# Patient Record
Sex: Male | Born: 1965 | Hispanic: Yes | Marital: Married | Smoking: Never smoker
Health system: Western US, Academic
[De-identification: ages and names within clinical notes are randomized; demographics above are authoritative.]

## PROBLEM LIST (undated history)

## (undated) DIAGNOSIS — N289 Disorder of kidney and ureter, unspecified: Secondary | ICD-10-CM

## (undated) DIAGNOSIS — E119 Type 2 diabetes mellitus without complications: Secondary | ICD-10-CM

## (undated) DIAGNOSIS — I10 Essential (primary) hypertension: Secondary | ICD-10-CM

## (undated) HISTORY — PX: TRANSPLANT, KIDNEY: SHX900003

---

## 2012-02-20 ENCOUNTER — Ambulatory Visit: Payer: MEDICARE

## 2012-02-20 ENCOUNTER — Encounter: Payer: Self-pay | Admitting: Nephrology

## 2012-05-29 ENCOUNTER — Other Ambulatory Visit: Payer: Self-pay | Admitting: Nephrology

## 2012-06-02 ENCOUNTER — Ambulatory Visit: Payer: MEDICARE

## 2012-06-02 ENCOUNTER — Ambulatory Visit

## 2012-06-02 NOTE — Nursing Note (Signed)
>>   Merlyn Albert, NP     Mon Jun 02, 2012  1:42 PM  No show for eval appointment in [place]. Call to patient who answered the phone. He had forgotten the appointment as he also has a cardiology appointment pending. Instructed to call [facility] to reschedule. Pt prefers to go to [facility] for complete eval.

## 2012-06-10 ENCOUNTER — Encounter: Payer: Self-pay | Admitting: Nephrology

## 2012-07-05 ENCOUNTER — Encounter: Payer: Self-pay | Admitting: Nephrology

## 2012-07-17 ENCOUNTER — Other Ambulatory Visit: Payer: Self-pay | Admitting: Nephrology

## 2012-07-17 ENCOUNTER — Ambulatory Visit

## 2012-07-17 ENCOUNTER — Ambulatory Visit: Admitting: Nephrology

## 2012-07-17 ENCOUNTER — Ambulatory Visit: Admit: 2012-07-17 | Discharge: 2012-07-17

## 2012-07-17 ENCOUNTER — Encounter: Payer: Self-pay | Admitting: Nephrology

## 2012-07-17 VITALS — BP 172/102 | HR 85 | Temp 96.4°F | Ht 63.39 in | Wt 169.5 lb

## 2012-07-17 DIAGNOSIS — K219 Gastro-esophageal reflux disease without esophagitis: Secondary | ICD-10-CM | POA: Insufficient documentation

## 2012-07-17 DIAGNOSIS — F32A Depression, unspecified: Secondary | ICD-10-CM | POA: Insufficient documentation

## 2012-07-17 DIAGNOSIS — E1121 Type 2 diabetes mellitus with diabetic nephropathy: Secondary | ICD-10-CM | POA: Insufficient documentation

## 2012-07-17 DIAGNOSIS — N186 End stage renal disease: Secondary | ICD-10-CM | POA: Insufficient documentation

## 2012-07-17 DIAGNOSIS — Z992 Dependence on renal dialysis: Secondary | ICD-10-CM | POA: Insufficient documentation

## 2012-07-17 DIAGNOSIS — I151 Hypertension secondary to other renal disorders: Secondary | ICD-10-CM | POA: Insufficient documentation

## 2012-07-17 DIAGNOSIS — N2581 Secondary hyperparathyroidism of renal origin: Secondary | ICD-10-CM | POA: Insufficient documentation

## 2012-07-17 DIAGNOSIS — E785 Hyperlipidemia, unspecified: Secondary | ICD-10-CM | POA: Insufficient documentation

## 2012-07-17 NOTE — Progress Notes (Signed)
Pt. Arrived for new patient education.  The materials were presented verbally .The patient has previously received written education materials covering the same content.   No barriers to learning were identified. The patient had all questions answered and was given information to contact me, the Transplant Coordinator, if additional questions arise.  The patient was given my business card and my phone number.  The patient had an additional opportunity to have questions answered during the medical evaluation appointment with the physician. Education was provided on the following topics:    1. Information on the performance and survival statistics for the [place] [facility].  2. The purpose for and composition of the medical evaluation for placement on the waitlist.  3. The transplant selection committee and selection criteria for transplant.  4. HLA typing and the concepts regarding matching organ donors and recipients.  5. The option of living donation and the reasons it is preferred over the use of deceased donor organs.  6. The waitlist, notification of placement on the waitlist, and organ allocation.  7. The option to provide consent for the use of expanded criteria organs.   8.. The surgical procedure.  9. The typical hospital course.  10. Medications and side effects, and the need for family members to be available to learn the patients medications along with the patient.   11. Long term care after transplant including laboratory testing and clinic visit schedules.  12. Potential complications including medical, psychosocial and financial.
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
## 2012-07-17 NOTE — Progress Notes (Signed)
PATIENT:  Charles Galvan, Charles Galvan  MR #:  [id number]  DOB:  [birth year]  SEX:  M  AGE:  45  SERVICE DATE:  07/17/2012    TRANSPLANT CENTER INITIAL CLINIC NOTE    REQUESTING PHYSICIAN:  Nestor Ramp, MD.    REASON FOR CONSULTATION:    Evaluate medical suitability for kidney transplantation.    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  He is here today by himself  seeking evaluation regarding renal transplantation candidacy.  This  interview was conducted in [place].    PAST MEDICAL HISTORY:    1.  Diabetes mellitus, type II, diagnosed in 1995.  He had been  controlled with oral agents.  He just required insulin for about two  or three years, and since he started on dialysis, he is back on oral  agents only.  He checks his blood sugars twice daily.  He does have  retinopathy.  He had laser photocoagulation of the retinas and  vitrectomies bilaterally.  His vision is preserved, though, and he can  still drive.  He does have neuropathy from his knees down bilaterally.  No foot ulcers.  He denies gastroparesis, yet, on his medication list,  I note that he has a prescription for Zofran which he claims has  expired because he does not use it that often.  He was first told  about kidney disease in 2010.    2.  Hypertension over the past three to four years with no admission  to the hospital because of hypertension out of control.    3.  Chronic kidney disease, stage V, attributed to diabetes.  As  mentioned above, he was first told about kidney problems in 2010.  He  had no biopsy, per his report.  He started hemodialysis on 11/03/10,  currently dialyzes Mondays, Wednesdays and Fridays for three and a  half hours via a left upper arm AV fistula.  His dry weight is 74.5  kg.  He gains 1-2 kg in between dialysis.  His urine output is about  one cup a day.  He told me today he has no potential live donors at  this time.  He has been listed in  one of the programs in the [facility]  for the past year or so.  He notes that his blood type is A.    4.  Depression, mostly situational because of his health and the fact  that he had to quit working.  No suicidal ideation.  He claims that  now, on the Lexapro, he feels much better.  He is planning to go back  to school and get a degree in social services.    5.  Dyslipidemia.    PAST SURGICAL HISTORY:    Bilateral laser photocoagulation and retinal surgeries.  Bilateral  carpal tunnel surgeries.  Bilateral rotator cuff repair in his  shoulders.  Laparoscopic cholecystectomy.  He claims he had bilateral  inguinal hernia repair, yet I do not see the scars from that.  I did  see a scar on the umbilicus.  He claims that mesh was placed  bilaterally.    FAMILY HISTORY:    Mother is alive at the age of 65.  She has diabetes.  The patient has  no contact with his biological father.  A brother aged 57 is healthy.  Two sisters, both of them had gestational diabetes.  One son and two  daughters, all of them healthy.    SOCIAL HISTORY:    He is married.  He lives with his spouse and his three kids and two  grandkids.  He is a disabled Presenter, broadcasting.  No tobacco, alcohol or  recreational drug use.  He has three and a half years of college  education.    MEDICATIONS:    Medications are PhosLo, Lexapro, glyburide, metoprolol, Bumex,  nifedipine, simvastatin, and multivitamins.  He also has prescriptions  for Prilosec and Zofran.    ALLERGIES:    NO KNOWN DRUG ALLERGIES.  HE TOLD ME THAT ONCE HIS KIDNEY WAS FAILING,  HE WAS TOLD TO AVOID ONE OF THE MEDICATIONS OF WHICH HE COULD NOT  REMEMBER THE NAME BECAUSE OF IT CAUSING WORSENING RENAL FUNCTION.    REVIEW OF SYSTEMS:    His weight has been stable.  He is active.  He goes to the gym three  to four times a week.  He does 30-45 minutes of treadmill, he lifts  weight, and he swims.  He denies any hearing problems.  No pneumonias, asthma, bronchitis or emphysema.  No valley fever  or  tuberculosis.  He had a stress test and an echocardiogram about a month ago at  [facility] in [place] by Dr. Ardis Rowan.  Per his report, those  turned out normal.  I do not have the records, though.  He does have some gastroesophageal reflux symptoms, and he takes  Prilosec as needed.  He claims, also, that about a month ago, he had  an upper GI endoscopy and a lower GI endoscopy in [place].  He denies any kidney infections, bladder infections or prostate  problems.  He had blood transfusions when he started on dialysis.  He has had  chickenpox, no shingles.  He has had chronic pruritus of his skin and some cysts that  spontaneously ruptured.  He denies MRSA, denies psoriasis or eczema.  He denies any spontaneous deep vein thrombosis or pulmonary embolism.  He denies any neurological events.  He told me today he is interested in a pancreas transplant, as well.    All other systems are negative.    PHYSICAL EXAMINATION:    GENERAL APPEARANCE:  Physical examination reveals a young Hispanic  gentleman in no acute distress, well dressed and well groomed.  VITAL SIGNS:  He is 161 cm tall.  He weighs 76.9 kg.  His BMI is 29.7.  Blood pressure 172/102.  Pulse 85 and regular.  ENMT:  He has a couple of broken molars with some mild periodontal  disease.  No other oral lesions.  No ear or nose lesions.  EYES:  Pupils equal, round and reactive to light. Extra ocular  movements were intact. No jaundice.  RESPIRATORY:  lungs clear to auscultation and percussion.  CARDIOVASCULAR:  heart with regular rhythm, no S3 or S4.  No carotid  bruits were heard. No jugular vein distention. No cyanosis, clubbing,  or edema of lower extremities.  2/2 dorsalis pedis pulses bilaterally.  GI:  Abdomen is soft and nontender.  No hepatosplenomegaly felt.  I  saw scars from laparoscopic cholecystectomy and a small periumbilical  scar.  GU:  no CVA tenderness.  HEME/LYMPH:  no palpable lymph nodes.  NEUROLOGIC:  alert and oriented x3, no  significant tremors.  Sensory  neuropathy in a stocking distribution.  PSYCH:  appropriate affect, denied depression.  MUSCULOSKELETAL:  Bilateral scars from carpal tunnel surgery and  bilateral shoulder surgeries.  SKIN:  Dry  skin with multiple excoriations, a few acne.  A few scars  from what seems to be some subcutaneous cysts that ruptured.  OTHER:  Left upper arm AV fistula with good thrill.    LABORATORY DATA:    No recent labs are available to me.    ASSESSMENT:    I think Mr. Cora Collum., is a suitable candidate to pursue kidney  transplantation.  I will highlight the following risk factors  discussed with him during this interview.    1.  Diabetes mellitus.  I told him that after transplant, his diabetes  control will be affected by the functioning kidney and the  immunosuppressive medication.  He may go back to insulin.  I told him  that he probably would not qualify for kidney-pancreas transplant as  he is not using insulin at this time.  He voiced an understanding of  my recommendations.    2.  Cardiovascular screening.  We will try to obtain the result of the  stress test and the echocardiogram done by Dr. Ardis Rowan in [facility] in [place] last month.  He is a very active gentleman.  I  will be surprised if I see major abnormalities on his tests.    3.  Screening for malignancy.  He is too young for prostate cancer and  has no family history of that.  He is also too young for colon cancer,  yet, per his report, he had a colonoscopy done a month ago.  Thus, I  would like to retrieve the results of those.  He also does not have a  family history of colon cancer.    4.  Hypertension.  He should continue to work closely with Dr. Darleen Crocker.  That is to decrease his cardiovascular risks.    5.  Depression.  Seems to be stable, quite upbeat today.  We will see  the social worker's report.    6.  Dental.  It would not be a requirement, yet I told him that  ideally he should have his dental issues solved before   immunosuppression to decrease the risk of infections posttransplant.  Again, I do not think that we would hold off his transplant because of  that.    7.  Lack of living donor.  I told him that, ideally, he should pursue  a living-donor transplant as a means to expedite his transplant and  provide him with better short and long-term graft survival.  He will  go back and talk to his family.    Thank you for allowing me to see him in consultation.      Report Electronically Signed - 07/18/2012 21:43:50 by  Gerri Spore, MD  Attending  Internal Medicine  Transplant Nephrology    AD/th  D:  07/17/2012 15:19:30 PDT/PST  T:  07/17/2012 17:00:28 PDT/PST  Job #:  [id number] / [id number]    cc:   Nestor Ramp, MD, [address], [place], [place] [address]

## 2012-07-22 ENCOUNTER — Other Ambulatory Visit: Payer: Self-pay | Admitting: Nephrology

## 2012-08-27 ENCOUNTER — Ambulatory Visit: Payer: Self-pay

## 2012-08-28 ENCOUNTER — Encounter: Payer: Self-pay | Admitting: Nephrology

## 2012-09-30 ENCOUNTER — Ambulatory Visit: Payer: Self-pay

## 2012-09-30 NOTE — Progress Notes (Signed)
CLINICAL SOCIAL SERVICES -  [facility] Progress Note                                        Date:  09/30/2012   Time: 11:00AM    Age: 46yr     Gender: male     Ethnicity: Hispanic  Patient's Diagnosis/Reason for Medical Tx: Patient was initially evaluated for kidney Galvan on 07/17/2012.  Please refer for detailed information. Patient did not bring his support to that appointment.  He returns to meet with SW to discuss his support plan.  Additionally, there were concerns regarding patient's diabetes management as his hemoglobin A1C was elevated.  Chart Reviewed: yes       Language: English     Interpreter Assisting with Interview:    Persons Interviewed: Patient and his son, Charles Galvan     Current Social Situation:  Charles Galvan. Is a 46yr English speaking married Hispanic male who resides with his wife, three children, and two grandchildren (a 15 month old and a two year old) in a home that he rents in [place], [place].  Support System:  Charles Galvan. identifies his primary support person to be his son, Charles Galvan, age 38, who was present.  He is currently unemployed and is considering college admission.  Expectations of support were reviewed in detail. Charles Galvan verbalized a commitment to assist with all recuperative needs, transportation post-surgery, monitoring Galvan regimen and emotional support.  Patient's son states that post-Galvan they plan to stay with his significant other in [place], [place] to facilitate patient's hospitalization visit and post-Galvan appointments.  Additional support is available through his wife, Charles Galvan, age 38, who is a homemaker.  Charles Galvan is Spanish speaking.  Charles Galvan states his friend from church, Charles Galvan is also able to help.    Medical History/Compliance:  Charles Galvan. reports following MD recommendations, taking medications as prescribed, following prescribed diet, attending all dialysis treatments and following fluid/dietary dialysis  recommendations.  The staff at the dialysis unit reports the patient keeps all appointments, completes full run, follows dietary recommendations and restricts fluids.  The dialysis staff also reported that Charles Galvan is compliant and if he misses an appointment he does make up his treatments.  The social worker stated, "he is a good self-advocate and does email and contact us if he has any problems.  I would describe him as adherent."   Phosphorus levels: December 2013: 4.9; November 2013: 5.5; October 2013: 6.3; September 2013: 4.5  Potassium level: December 2013: Not available per dialysis staf; November 2013: 4.5; October 2013: 4.6; September 2013: 5.9  Fluid gain between treatment: 1.9kg  Hemoglobin A1C: 07/09/12: 6.4    RECOMMENDATION:  Charles Galvan.   Charles Galvan does appear to have an adequate support plan for Galvan.  Charles Galvan does currently appear to adhere to medical recommendations and dietary restrictions.        Eulah Citizen, LCSW  Pager [phone]

## 2012-10-29 ENCOUNTER — Encounter: Payer: Self-pay | Admitting: Nephrology

## 2012-12-01 ENCOUNTER — Encounter: Payer: Self-pay | Admitting: Transplant Surgery

## 2012-12-01 ENCOUNTER — Encounter: Payer: Self-pay | Admitting: Nephrology

## 2012-12-21 ENCOUNTER — Inpatient Hospital Stay: Admission: RE | Admit: 2012-12-21 | Attending: Transplant Surgery | Admitting: Transplant Surgery

## 2012-12-21 HISTORY — DX: Disorder of kidney and ureter, unspecified: N28.9

## 2012-12-21 HISTORY — DX: Essential (primary) hypertension: I10

## 2012-12-21 HISTORY — DX: Type 2 diabetes mellitus without complications: E11.9

## 2012-12-21 MED ORDER — PRAVASTATIN 20 MG TABLET
20.0000 mg | ORAL_TABLET | Freq: Once | ORAL | Status: AC
Start: 2012-12-21 — End: 2012-12-21
  Administered 2012-12-21: 20 mg via ORAL
  Filled 2012-12-21: qty 1

## 2012-12-21 MED ORDER — NACL 0.9 % DIALYSIS CIRCUIT FLUSH
100.0000 mL | INTRAVENOUS | Status: AC
Start: 2012-12-21 — End: 2012-12-22

## 2012-12-21 MED ORDER — LIDOCAINE HCL 10 MG/ML (1 %) INJECTION SOLUTION
0.2000 mL | INTRAMUSCULAR | Status: AC | PRN
Start: 2012-12-21 — End: 2012-12-21

## 2012-12-21 MED ORDER — DEPRECATED NACL 0.9% 250ML CARRIER
125.0000 mg | INTRAVENOUS | Status: AC
Start: 2012-12-21 — End: 2012-12-22
  Administered 2012-12-21: 125 mg via INTRAVENOUS
  Filled 2012-12-21: qty 125

## 2012-12-21 MED ORDER — METOPROLOL TARTRATE 50 MG TABLET
50.0000 mg | ORAL_TABLET | Freq: Two times a day (BID) | ORAL | Status: DC
Start: 2012-12-21 — End: 2012-12-22
  Administered 2012-12-21 – 2012-12-22 (×2): 50 mg via ORAL
  Filled 2012-12-21 (×2): qty 1

## 2012-12-21 MED ORDER — MYCOPHENOLATE MOFETIL 500 MG TABLET
500.0000 mg | ORAL_TABLET | Freq: Once | ORAL | Status: AC
Start: 2012-12-21 — End: 2012-12-21
  Administered 2012-12-21: 500 mg via ORAL
  Filled 2012-12-21: qty 1

## 2012-12-21 MED ORDER — CEFAZOLIN 1 GRAM SOLUTION FOR INJECTION
1.0000 g | INTRAMUSCULAR | Status: DC
Start: 2012-12-21 — End: 2012-12-22
  Administered 2012-12-21: 1 g via INTRAVENOUS

## 2012-12-21 MED ORDER — METHYLPREDNISOLONE SODIUM SUCCINATE 1,000 MG INTRAVENOUS SOLUTION
250.0000 mg | INTRAVENOUS | Status: DC
Start: 2012-12-21 — End: 2012-12-22
  Administered 2012-12-21: 250 mg via INTRAVENOUS
  Filled 2012-12-21: qty 4

## 2012-12-21 MED ORDER — NACL 0.9% IV BOLUS - DURATION REQ
100.0000 mL | INTRAVENOUS | Status: AC | PRN
Start: 2012-12-21 — End: 2012-12-22

## 2012-12-21 MED ORDER — DEPRECATED NACL 0.9% 250ML CARRIER
1.5000 mg/kg | INTRAVENOUS | Status: DC
Start: 2012-12-21 — End: 2012-12-21

## 2012-12-21 NOTE — Nurse Focus (Signed)
PRE OP NURSING NOTE    Note Started: 12/21/2012, 20:01     Peds en bloc DDRT procedure/surgery discussed with patient. Preoperative teaching done.  Jossie Ng, RN RN

## 2012-12-21 NOTE — Plan of Care (Signed)
Problem: Kidney Transplant (Adult)  Goal: Prevent/Manage Potential Problems (Kidney Transplant (Adult))  Outcome: Signs and symptoms of listed potential problems will be absent or manageable (reference CPG)  Outcome: Progressing  Pre-op completed waiting for OR.

## 2012-12-21 NOTE — Progress Notes (Addendum)
Renal Services  Hemodialysis Summary  (see hemodialysis flowsheet for more details)    Date:    12/21/2012  Time:    1430  Duration:   3  hours  Dialysis #:   1    Dialyzer:  opti 160  Access:  Left, Upper, arm , fistula    Fluid Balance:  Removed:   1100  mL  Given:   1100 mL  Net Removed: 0  mL    Kt/V:  Volume:  40.8 L  Delivered Kt/Vurea single pool: .86  Blood Volume Processed: 58.9  If Volume is unavailable report KTurea only: na    Lab Results   Lab Name Value Date/Time    NA 139 12/21/2012  8:21 AM    K 6.0* 12/21/2012  8:21 AM    CL 101 12/21/2012  8:21 AM    CO2 26 12/21/2012  8:21 AM    BUN 64* 12/21/2012  8:21 AM    CR 10.30* 12/21/2012  8:21 AM    GLU 180* 12/21/2012  8:21 AM       Dialysis Solution:  Sodium:   138 mEq/L  Bicarbonate:   25 mEq/L  Potassium:   2 mEq/L  Calcium:   2.5 mEq/L  Phosphorus:  0 mg/dL    Anticoagulation:  Type:    saline  Load:    0  Infused:   1100 ml  Total:    1100 ml    Lock:  Type:    na  Arterial Vol:   na mL  Venous Vol: na mL    Comment:   Stable tx    To be completed by Attending Nephrologist:  Patient seen and evaluted 1 time(s) during this hemodialysis treatment.      Electronically signed by   Rockey Situ, MD PhD  PI: [phone]  Pager: [phone]  Faculty  Division of Nephrology  Department of Internal Medicine

## 2012-12-21 NOTE — Nurse Focus (Signed)
1000- Pre-op testing and teaching completed. Patientt remains NPO at this time. Will continue to monitor. [name], RN.

## 2012-12-21 NOTE — Progress Notes (Signed)
ANESTHESIOLOGY ATTENDING NOTE  Date: 12/21/2012 Time: 19:55      Date of service (Patient contact): 12/21/2012    Procedure: Kidney transplant    Anesthesia: General    Perioperative Presence: CRNA provider, I provided direction.    Concurrency: TWO-FOUR CONCURRENT CASES:  I was physically present for the key portion(s) of the procedure and during other times, was immediately available to return to the procedure.  The key portions of the procedure are documented below.  During the time in which my physical presence was not required, a designated backup teaching anesthesiologist was immediately available.     Name of Backup Anesthesiologist: Leta Speller    Key Portions:  Induction, Periodic Monitoring and Emergence central line    Electronically signed by:    Payton Doughty   Attending  Department of Pain and Anesthesiology  PI [phone], pgr [phone]                               The information contained on this form is true and correct to the best of my knowledge. Further, I understand that if I misrepresent, falsify or conceal information regarding my participation in the professional service described above, I may be subject to fine, imprisonment, or civil penalty under applicable federal laws.

## 2012-12-21 NOTE — Nurse Assessment (Signed)
ADMIT NURSING NOTE    Note Started: 12/21/2012, 12:36      Patient admitted at 0815  hours as a direct admit and accompanied by his son. Pt condition stable . patient oriented to room and unit. MD notified of patient's arrival on unit at 0815 hours. Admission Assessment and Plan of Care initiated. Patient admitted for renal transplant evaluation/pre-op. Initial assessment completed, labs drawn and sent. Will continue to monitor.Charles Major, RN RN

## 2012-12-21 NOTE — Nurse Focus (Signed)
2000-Pt off the floor to the OR via bed.      Lutricia Horsfall, RN

## 2012-12-21 NOTE — Progress Notes (Addendum)
MRN [id number]  Charles Galvan  DOB [birth year]  LINE PROCEDURE NOTE  Note started: 12/21/2012  22:50        * Date of service:12/21/2012      LOS:  LOS: 0 days         Pre Procedure Diagnosis: for renal transplant  Post Procedure Diagnosis: same  * Patient's specific hospital location during procedure:OR       Central line inserter ID [id number] Name, Last Cornelius Moras  * Occupation of inserter: Other (specify): CRNA  Consent form completed and signed by the patient/guardian: Yes  ID verified by two sources (select any two from list): MRN and Name    Was inserter a member of PIC/IV Team no     Appropriate procedural pause was taken.  Reason for Insertion:  TYPE OF PROCEDURE: CENTRAL VENOUS CATHETER PLACEMENT    * Indication: New indication  * Hand hygiene: Inserter performed hand hygiene prior to central line insertion: yes  * Sterility: Cap, , Drape,, Gloves,, Gown, and Mask,  * Prep: Chlorhexidine  Was skin prep completely dry at time of first skin puncture yes  Type of Anesthesia/Local Anesthetic: Other: general  * Vein: IJ  left  Technique: Ultrasound: Using ultrasound, the vessel was interrogated and ensured to be patent. Using dynamic guidance, the needle was directed toward the vessel, resulting in realtime visualization of vascular needle entry. These images were archived digitally.  * Catheter:  non tunneled   * Lumen(s): Double Lumen          POST PROCEDURE  Estimated blood loss: 3 mL  Radiology: CXR ordered  Suture:   Sutured at 14 cm  Complications: NoneDid this insertion attempt result in a successful central line placement? yes    * Data needed by Infection Control for CLIP reporting   CLIP notification required for each line note (CL,PA Cath): Send CLIP notification [facility] lines)    Jeanie Sewer, CRNA   Other (specify):    Pager#: *[phone]**                This patient was seen, evaluated, and care plan was developed with the resident.  I agree with the assessment and plan as outlined in the  resident's note.  I was also present for the entire procedure.  Report electronically signed by Waylan Rocher, MD. Attending

## 2012-12-21 NOTE — Nurse Assessment (Signed)
ASSESSMENT NOTE    Note Started: 12/21/2012, 1940     Initial assessment completed and recorded in EMR.  Report received from day shift nurse and orders reviewed. Plan of Care reviewed and appropriate, discussed with patient and family.  Pt awaiting renal transplant, all pre-operative tasks complete.  NPO, VSS, denies pain.  Wife and daughter at bedside, attentive to pt. Call light within reach, will continue to monitor. Lutricia Horsfall, RN RN

## 2012-12-21 NOTE — H&P (Addendum)
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  He was placed on the [facility] kidney transplant list and was called into the hospital today because a suitable donor was located for him.    Dialysis:  He started hemodialysis on 11/03/10; dialyzes Mondays, Wednesdays and Fridays for three and a half hours via a left upper arm AV fistula; last dialysis on Friday.    Last UOP: 1 - 2 cup per day   CMV: Positive  PRA: 0 %  Admission Weight: 75.2 kg  Dry Weight: 74.5 kg (gains 1-2 kg between dialysis sessions)  Function Status: Able to ambulate up 6 flights of stairs without chest pain or shortness of breath.    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 down w/o ulcers   Hypertension   Depression (states related to health)   Dyslipidemia    PAST SURGICAL HISTORY:BL Laser photcoagulaton and retinal surgeries  B/L carpal tunnel surgeries  B/L rotator cuff repairs in shoulders  Laparoscopic cholecystectomy  B/L inguinal hernias with mesh - patient states this was done laparoscopically    Home Meds:  Calcium Acetate 667mg , 3 tabs TID  Nephrovite daily  Metoprolol 50mg  BID  Atarax 25mg  Q8PRN  Nifedipine ER 60mg  BID  Bumetanide 2mg  BID  Glyburide 2.5mg  QAM  Zofran 4mg  DailyPRN    Allergies:  Review of patient's allergies indicates no known allergies.    FHx:  No family history on file.  Mother:  Has DM, alive at age 37  90 year old healthy brother  Two sisters with gestational DM  Three kids (1 son, 2 daughters, both healthy)    SHx:  He is married. He lives with his spouse and his three kids and twograndkids. He  is a disabled Presenter, broadcasting. No tobacco, alcohol or recreational drug use. He has three and a half years of college education.  History     Social History    Marital Status: MARRIED     Spouse Name: N/A     Number of Children: N/A    Years of Education: N/A     Social History Main Topics    Smoking status: Never Smoker     Smokeless tobacco: Never Used    Alcohol Use: Not on file    Drug Use: Not on file    Sexually Active: Not on file     Other Topics Concern    Not on file     Social History Narrative    No narrative on file     REVIEW OF SYSTEMS:  Denies any recent illnesses or hospitalizations  Denies fevers, chill, weight gain or weight loss  Denies visual or hearing disturbances  Denies chest pain or palpitations  Denis shortness of breath or difficulty breathing  Denies abd pain, nausea, vomiting, constipation, or diarrhea  Denies dysuria or henaturia  Denies headache, dizzines, or syncope  Denies polydipsia or polyphagia    PE:  Temp: 36.6 C (97.9 F) (03/02 0840)  Temp src: Oral (03/02 0840)  Pulse: 88  (03/02 0840)  BP: 171/85 mmHg (03/02 0840)  Resp: 20  (03/02 0840)  SpO2: 97 % (03/02 0840)  Height: 160 cm (5\' 3" ) (03/02 0840)  Weight: 75.2 kg (165 lb 12.6 oz) (03/02 0840)  Body mass index is 29.38 kg/(m^2).    Gen:   HEENT: Normal and PERRLA,   Neck: supple and no adenopathy  CV: S1S2, RRR, no murmur  Pulm: clear bilaterally, chest expansion symmetrical  Abd: Abdomen soft, non-tender. BS normal. No masses, organomegaly  Ext: Extremities normal. No deformities, edema, or skin discoloration. Dorasalis pedis pulses 2+ bilaterally. Post. Tib pulses 2+ bilaterally.  Skin: Skin color, texture, turgor normal. No rashes or lesions.  MS: negative  Neuro: normal without focal findings, mental status, speech normal, alert and oriented x iii,PERLA    Labs  CBC   Recent labs for the past 72 hours     12/21/12 0821    WHITE BLOOD CELL COUNT 8.6    HEMOGLOBIN 11.6*    HEMATOCRIT 34.5*    PLATELET  COUNT 155       BASIC METABOLIC PANEL   Recent labs for the past 72 hours     12/21/12 0821    GLUCOSE 180*    UREA NITROGEN, BLOOD (BUN) 64*    CREATININE BLOOD 10.30*    SODIUM 139    POTASSIUM 6.0*    CHLORIDE 101    CARBON DIOXIDE TOTAL 26    CALCIUM 10.4     INR   Recent labs for the past 72 hours     12/21/12 0821    INR 0.94     PT   Recent labs for the past 72 hours     12/21/12 0821    PROTHROMBIN TIME --    APTT 25.6       Lab Results   Lab Name Value Date/Time    AMY 231* 12/21/2012  8:21 AM     Lab Results   Lab Name Value Date/Time    AST 29 12/21/2012  8:21 AM    ALT 32 12/21/2012  8:21 AM    ALP 69 12/21/2012  8:21 AM    ALB 3.8 12/21/2012  8:21 AM    TP 7.2 12/21/2012  8:21 AM    TBIL 0.5 12/21/2012  8:21 AM        PRA: 0 %  CMV: Positive    Pre-op Studies:  Chest X ray (Read by Radiology Attending Dr. Myrtis Ser ): Tiny old Granulomas  EKG: normal EKG, normal sinus rhythm, unchanged from previous tracings.  Recent Cardiac Studies    OTHER IMAGING:  EKG 12/18/11:  NRS, predominant precordial t waves  Stress ECHO 05/16/12:  No acute cardiopulmonary abnormality identified  Clonoscopy 05/05/12:  Internal Hemorrhoids,  Path: Hypertrophic mucosal fold.  NO dysplasia or malignancy.      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    Immunosuppression: Low Immunologic Risk: PRA 0%;   The patient will undergo induction with the low immunologic risk immunosuppression protocol.  Thymoglobulin 1.5 mg/kg IV and Solumedrol 250 mg IV is ordered on call to the OR.  Cellcept 500 mg PO once and Pravachol 20 mg PO once is ordered for administration preoperatively.    Prophylaxis:     IntermediateHigh Risk CMV (CMV Donor negative, CMV positive Recipient)    Will start Ganciclovir 1.25 mg/kg IV q 24 hours post operatively.  The patient will also receive perioperative antibiotic prophylaxis with IV Kefzol.    F/E/N:   Euvolemic.      Potassium Hyperkalemia.    Patient does require hemodialysis for hyperkalemia prior to transplantation.    Transplant Nephrology contacted for dialysis orders. Dr.Gallay was notified.    Patient NPO, no IV maintenance fluids while NPO because patient is dialysis patient with high risk of fluid overload.    CV: Hypertension.     Continue Beta Blocker pre-op for peri-operative cardiac protection.    CAD.      No evidence for active CAD, Continue  Beta Blocker.      Pulm: No current pulmonary issues.       GU:  Oliguric(urine output quantity 1-2 cups daily).  Plan for Foley placement in OR.    Endocrine: Diabetes Mellitus Type  2.  Hold insulin and oral agents while NPO.  Check FS q 6 hours while NPO, will address blood sugar if greater than 300 with conservative dose of regular insulin.    Heme: 3 units of PRBC crossmatched, placed on hold for OR.    ID: No current issues.    Wound: No Significant central obesity or  Pannus.  Will consider intraoperative drain placement and antibiotic wound prophylaxis post operatively.    Consent:    The patient was given the opportunity to give consent today.  The risks and benefits of the procedure were discussed with the patient, including but not limited to, hypotension, arrhythmias, DVT, MI, PE, stroke, death, infection, bleeding, graft thrombosis, graft rejection, delayed graft function, possible need for dialysis after transplant, urologic complications, possible need for long term Foley catheter placement and/or discharge with Foley catheter, the need for additional procedures, potential injury to adjacent structures, risk of new post transplant diabetes or worsening diabetes, and the need to take immunosuppression for life. The consent was obtained by Fara Boros MD.    The patient expressed an understanding of these risks, signed the consent form, and it was placed in the chart.    This case was discussed with Attending Surgeon Dr. Carlynn Purl.    Electronically  Signed on 12/21/2012 at 10:17 by:  Bernell List, MD  [facility]  PGY-2, General Surgery  Transplant Surgery  Service Pager: [phone]  Personal Pager:  [phone]  PI# [id number]    This patient was seen, evaluated, and a care plan was developed with the resident. I agree with the findings and plan as outlined in Dr Alden Benjamin note above.    Vernice Jefferson, MD  ATTENDING TRANSPLANT SURGEON  PI [phone]

## 2012-12-21 NOTE — Progress Notes (Addendum)
ANESTHESIA PRE OP ASSESSMENT  Date: 12/21/2012 Time: 20:44   Date of Service (Patient contact): 2 mar 14  Patient Name: Charles Galvan  39yr  27-Oct-1965    Consent form completed and signed by the patient: yes   Scheduled Surgery Date: 2 mar 14  Proposed Surgery:  Renal transplant  Pre-op Dx: ESRD    HISTORY  Patient Active Problem List    Diagnosis Date Noted    CKD (chronic kidney disease) stage V requiring chronic dialysis 07/17/2012    Chronic in-center hemodialysis status 07/17/2012    Type II diabetes mellitus with nephropathy 07/17/2012    Dyslipidemia 07/17/2012    GERD (gastroesophageal reflux disease) 07/17/2012    Depression 07/17/2012    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 Prescriptions prior to admission   Medication    Bumetanide (BUMEX) 2 mg Tablet    Calcium Acetate (PHOSLO) 667 mg Capsule    Escitalopram (LEXAPRO) 20 mg tablet    GlyBURIDE (DIABETA, MICRONASE) 2.5 mg Tablet    Metoprolol Tartrate (LOPRESSOR) 50 mg Tablet    NIFEdipine (ADALAT CC) 60 mg CR tablet    Omeprazole (PRILOSEC) 20 mg Delayed Release Capsule    Simvastatin (ZOCOR) 10 mg Tablet     No prescriptions prior to admission      Social History     Occupational History    Not on file.     Social History Main Topics    Smoking status: Never Smoker     Smokeless tobacco: Never Used    Alcohol Use: No    Drug Use: No    Sexually Active: Not on file    Active Inpatient Medications     Antithymocyte Globulin (THYMOGLOBULIN) 125 mg in NaCl 0.9% 280 mL (Rabbit) (Central Line Administration) IVPB, IV, ON-CALL OR  Cefazolin (KEFZOL, ANCEF) IV 1 g, IV, ON-CALL OR  MethylPREDNISolone (SOLU-MEDROL) 250 mg in NaCl 0.9% 50 mL IVPB, IV, ON-CALL OR  Metoprolol Tartrate (LOPRESSOR) Tablet 50  mg, ORAL, BID  NaCl 0.9% Dialysis Circuit Flush 100 mL, IV, Q30MIN            NaCl 0.9% Bolus 100-200 mL, IV, PRN        No family history on file.     Anesthetic Hx:  general anesthesia             CV Tests:  N/A    Patient Prescribed a Beta Blocker?  Yes.  Beta Blocker Taken in Last 24 Hours?  Yes    Labs:  Lab Results   Lab Name Value Date/Time    WHITE BLOOD CELL COUNT 8.6 12/21/2012 0821    HEMOGLOBIN 11.6* 12/21/2012 0821    HEMATOCRIT 34.5* 12/21/2012 0821    PLATELET COUNT 155 12/21/2012 0821     Lab Results   Lab Name Value Date/Time    SODIUM 139 12/21/2012 0821    POTASSIUM 6.0* 12/21/2012 0821    CHLORIDE 101 12/21/2012 0821    CARBON DIOXIDE TOTAL 26 12/21/2012 0821    UREA NITROGEN, BLOOD (BUN) 64* 12/21/2012 0821    CREATININE BLOOD 10.30* 12/21/2012 0821    GLUCOSE 180* 12/21/2012 0821     Lab Results  Lab Name Value Date/Time    INR 0.94 12/21/2012 0821    APTT 25.6 12/21/2012 0821     No results found for this basename: POCPREG, PREGURINE       ROS:  CV:  HTN  Resp:  None  Neuro: None  Musculoskeletal:  None  Med:  ESRD, DM Type II    Activity:  2 flights stairs    VITAL SIGNS:  Vital Signs (Last Recorded):  Temp: 36.4 C (97.5 F) (12/21/12 1940)  Temp src: Oral  Pulse: 81   BP: 172/82 mmHg  Resp: 18   SpO2: 99 %  Height: 160 cm (5\' 3" )  Weight: 75.2 kg (165 lb 12.6 oz)  Body surface area is 1.83 meters squared.  Body mass index is 29.38 kg/(m^2).    PE:  Mallampati image     Mallampati Class:  2  Oral Eval: Mouth opening normal  Neck ROM:  full  Thyroid-mentum distance in fingerbreaths: 3  Lungs:  clear to auscultation bilaterally  Card:  regular    ASA Status: 3 - Moderate to severe systemic disease that limits activity but not incapacitating    NPO Guidelines Met: yes    Consent:  Risks and benefits of General and Regional Anesthesia discussed with patient.  Questions answered and patient wishes to proceed.    Impression and Anesthetic Plan:   General anesthesia with central line placement    Jeanie Sewer,  CRNA Other (specify):           This patient was seen, evaluated, and care plan was developed with the CRNA.  I agree with the assessment and plan as outlined in the CRNA's note.  Report electronically signed by Waylan Rocher, MD. Attending

## 2012-12-21 NOTE — Consults (Signed)
Patient: Charles Galvan Location: T8   MRN: [id number]       Date of Birth: 1965-12-10  Sex: male              Age: 38yr    Note Date and Time: 12/21/2012    12:04  Date of Admission:     12/21/2012  7:06 AM    Date of Service: 12/21/2012 Patient's PCP:     No Pcp No Pcp        TRANSPLANT NEPHROLOGY INITIAL CONSULTATION NOTE    CONSULTATION REQUESTED BY:  Dr. Rise Patience of the Transplant Surgery Service     REASON FOR CONSULTATION: asked to make recommendations for preoperative dialysis and for immunosuppressive therapy after kidney transplant.      HISTORY OF PRESENT ILLNESS:   [name] is a 47yr old male with stage V chronic kidney disease due to diabetic nephropathy who presents for pediatric en bloc deceased donor renal transplant.  He has been on maintenance hemodialysis for about two years and dialyzes 3.5 hours Monday, Wednesday, and Friday with estimated dry weight 75 kg, and two to three cups daily urine output.  He dialyzes through a left upper arm AV fistula which has functioned well..       Pre-transplant PRA: 0% 11/12/2012 .      intermediate risk of active CMV disease.  EBV seropositive    Donor (UNOS#[id number]):   13 mo. old male 10.8 kg, death due to anoxia.  DBD with acute kidney injury   Admit creatinine:  0.9 mg/dL  Terminal creatinine:  2.6 mg/dL    Cross AVWUJ:05/22/1912  17:40    Donor-recipient HLA mismatch:  2A, 2B, 1DR        PAST MEDICAL HISTORY:  1.  Adult onset diabetes mellitus diagnosed 19 years ago. Complicated by retinopathy requiring laser therapy and bilateral vitrectomy, peripheral neuropathy with loss of sensation to the knees but no foot ulcers, no gastroparesis  2.  Stage V chronic kidney disease on hemodialysis since January 2012  3.  Hypertension diagnosed five to six years ago  4.  Situational depression  5.  Dyslipidemia   6.  S/p bilateral inguinal herniorrhaphy with mesh placement  7.  S/p bilateral laser photocoagulation and vitrectomies  8.  S/p placement of left  upper arm AV fistula  9.  S/p laparoscopic cholecystectomy   10.  S/p bilateral carpal tunnel release  11.  S/p bilateral rotator cuff surgeries      FAMILY HISTORY:  Two sisters with gestational diabetes    SOCIAL HISTORY:  Married, disabled Presenter, broadcasting.  He denies tobacco, alcohol, or recreational drug use.    OUTPATIENT MEDICATIONS:  Calcium Acetate 667mg , 3 tabs TID  Nephrovite daily  Metoprolol 50mg  BID  Atarax 25mg  Q8PRN  Nifedipine ER 60mg  BID  Bumetanide 2mg  BID  Glyburide 2.5mg  QAM  Zofran 4mg  DailyPRN      CURRENT SCHEDULED MEDICATIONS   Cefazolin (KEFZOL, ANCEF) IV 1 g, IV, ON-CALL OR  MethylPREDNISolone (SOLU-MEDROL) 250 mg in NaCl 0.9% 50 mL IVPB, IV, ON-CALL OR  Metoprolol Tartrate (LOPRESSOR) Tablet 50 mg, ORAL, BID  Mycophenolate (MMF) (CELLCEPT) Tablet 500 mg, ORAL, ONCE  Pravastatin (PRAVACHOL) Tablet 20 mg, ORAL, ONCE        ALLERGIES  No Known Allergies       REVIEW OF SYSTEMS:  CONSTITUTIONAL:  No chronic fevers, chills or sweats.  Weight stable.  He exercises regularly at the gym  EYES:  Normal vision.  ENT:  No ear, sinus, or dental problems.  PULMONARY:  No chronic cough, wheezing, shortness of breath, sputum production.  CARDIOVASCULAR:  No chest pain, palpitations, paroxysmal nocturnal dyspnea or orthopnea, edema, or claudication.    GASTROINTESTINAL: No chronic nausea, vomiting, diarrhea, constipation, heartburn, indigestion, hematemesis, melena or hematochezia.   GENITOURINARY:  No gross hematuria, nephrolithiasis, urinary urgency, frequency or dysuria.    MUSCULOSKELETAL:  No myalgias, arthralgias    SKIN:  No rash, itching  NEUROLOGIC:  No loss of consciousness, dizziness or lightheadedness.  No focal numbness or weakness.  No tremor.      PHYSICAL EXAMINATION:  GENERAL:  A young male looking stated age, in no acute distress.  Alert and oriented to person, place, and time.  No complaint of pain.     Patient Vitals for the past 24 hrs:   BP Temp Pulse Resp SpO2   12/21/12 1150  183/95 mmHg 36.6 C (97.9 F) 88  20  99 %   12/21/12 0840 171/85 mmHg 36.6 C (97.9 F) 88  20  97 %     Weight: 75.2 kg (165 lb 12.6 oz) (12/21/12 0840)   Body mass index is 29.38 kg/(m^2).     POC Glucose, blood   12/21/12 1150 161 mg/dl           EYES:  Pupils equal, round, reactive to light.  Intact extraocular movements. Sclerae anicteric.      ENT:  Oropharynx clear.  No sinus tenderness to palpation.    NECK:  No lymphadenopathy.  No jugular venous distention.  2/2 carotid pulses without bruits.  No thyromegaly or nodules.     LUNGS:  Clear to auscultation.      HEART:  Regular rhythm.  S1, S2.  No S3, S4, murmur, rub or gallop.      ABDOMEN:  Soft, nontender, nondistended.  No mass, organomegaly, or  bruit.  Normal bowel sounds.      EXTREMITIES:  No peripheral edema.  2/2 femoral and dorsalis pedis pulses bilaterally.  Left upper arm AV fistula intact with thrill and bruit.    SKIN:  No rash.    NEUROLOGIC:  Cranial nerves II-XI intact.  Motor strength 5/5 throughout.  Sensory decreased to light touch in stocking distribution.  No dysmetria, ataxia, or tremor.      LABORATORY DATA  COMPREHENSIVE METABOLIC PANEL Recent labs for the past 72 hours     12/21/12 0821    GLUCOSE 180*    UREA NITROGEN, BLOOD (BUN) 64*    CREATININE BLOOD 10.30*    SODIUM 139    POTASSIUM 6.0*    CHLORIDE 101    CARBON DIOXIDE TOTAL 26    CALCIUM 10.4    PROTEIN 7.2    ALBUMIN 3.8    BILIRUBIN TOTAL 0.5    ALKALINE PHOSPHATASE (ALP) 69    ASPARTATE TRANSAMINASE (AST) 29    ALANINE TRANSFERASE (ALT) 32         CBC Recent labs for the past 72 hours     12/21/12 0821    WHITE BLOOD CELL COUNT 8.6    HEMOGLOBIN 11.6*    HEMATOCRIT 34.5*    PLATELET COUNT 155       CXR:  Old granulomas    ECG:  NSR, no ST segment elevations    ASSESSMENT  [name] is a 47yr old male with stage V chronic kidney disease due to diabetic nephropathy who has no contraindication to renal transplant.Marland Kitchen  He  is hyperkalemic.     RECOMMEND  1.   Dialysis preoperatively to decrease serum potassium    2.  Immunosuppression.  Low immunologic risk protocol with rATG 7.5 mg/kg due to pediatric en bloc transplant    3.  Bactrim for PJP and valganciclovir for intermediate risk CMV prophylaxis for three months post-transplant.    4.  If severely hypertensive postoperatively, admit to ICU for intensive monitoring and labetalol IV to maintain BP within acceptable limits for pediatric kidneys        Thank you for allowing [place] to participate in this patient's care.  We will continue to follow with you.    This note was electronically signed - 12/21/2012 12:04 by:  Sabino Gasser, MD    PI#: [phone]  Faculty  Division of Nephrology  Department of Internal Medicine

## 2012-12-21 NOTE — Progress Notes (Addendum)
ANESTHESIOLOGY OPERATIVE NOTE  Date: 12/21/2012 Time: 22:49    Date of Service (Patient contact): 2 mar 14    Procedure: renal transplant    Anesthesia: General    Estimated Blood Loss: 100    Intravenous Fluids: Crystalloid 2900 ml    Urine output: 0  Anesthetic Complications: No apparent anesthetic complications  Disposition: to pacu        Electronically signed by:    Payton Doughty   Attending  Department of Pain and Anesthesiology  PI [phone], pgr [phone]

## 2012-12-22 MED ORDER — D5 / 0.45% NACL IV INFUSION
INTRAVENOUS | Status: DC
Start: 2012-12-22 — End: 2012-12-24
  Administered 2012-12-22 – 2012-12-23 (×2): via INTRAVENOUS

## 2012-12-22 MED ORDER — DEXTROSE 50 % IN WATER (D50W) INTRAVENOUS SYRINGE
50.0000 mL | INJECTION | INTRAVENOUS | Status: DC | PRN
Start: 2012-12-22 — End: 2012-12-22

## 2012-12-22 MED ORDER — BISACODYL 5 MG TABLET,DELAYED RELEASE
10.0000 mg | DELAYED_RELEASE_TABLET | Freq: Four times a day (QID) | ORAL | Status: DC
Start: 2012-12-25 — End: 2012-12-22

## 2012-12-22 MED ORDER — FAMOTIDINE (PF) 20 MG/50 ML IN 0.9 % NACL (ISO) INTRAVENOUS PIGGYBACK
20.0000 mg | INJECTION | INTRAVENOUS | Status: DC
Start: 2012-12-22 — End: 2012-12-28
  Administered 2012-12-23: 20 mg via INTRAVENOUS
  Filled 2012-12-22: qty 50

## 2012-12-22 MED ORDER — LIDOCAINE HCL 10 MG/ML (1 %) INJECTION SOLUTION
0.2000 mL | INTRAMUSCULAR | Status: AC | PRN
Start: 2012-12-22 — End: 2012-12-22

## 2012-12-22 MED ORDER — ACETAMINOPHEN 325 MG TABLET
650.0000 mg | ORAL_TABLET | Freq: Once | ORAL | Status: AC
Start: 2012-12-22 — End: 2012-12-22
  Administered 2012-12-22: 650 mg via ORAL
  Filled 2012-12-22: qty 2

## 2012-12-22 MED ORDER — GANCICLOVIR SODIUM 500 MG INTRAVENOUS SOLUTION
1.2500 mg/kg | Freq: Every day | INTRAVENOUS | Status: DC
Start: 2012-12-22 — End: 2012-12-26
  Administered 2012-12-22 – 2012-12-25 (×4): 95 mg via INTRAVENOUS
  Filled 2012-12-22 (×4): qty 1.9

## 2012-12-22 MED ORDER — NACL 0.45% IV INFUSION
INTRAVENOUS | Status: DC
Start: 2012-12-22 — End: 2012-12-23
  Administered 2012-12-22: 04:00:00 via INTRAVENOUS

## 2012-12-22 MED ORDER — GLUCAGON (HUMAN RECOMBINANT) 1 MG SOLUTION FOR INJECTION
1.0000 mg | INTRAMUSCULAR | Status: DC | PRN
Start: 2012-12-22 — End: 2012-12-22

## 2012-12-22 MED ORDER — DEXTROSE 50 % IN WATER (D50W) INTRAVENOUS SYRINGE
25.0000 mL | INJECTION | INTRAVENOUS | Status: DC | PRN
Start: 2012-12-22 — End: 2012-12-23

## 2012-12-22 MED ORDER — INSULIN U-100 REGULAR HUMAN 100 UNIT/ML INJECTION SOLUTION
2.0000 [IU] | Freq: Four times a day (QID) | INTRAMUSCULAR | Status: DC
Start: 2012-12-22 — End: 2012-12-22

## 2012-12-22 MED ORDER — PRAVASTATIN 20 MG TABLET
20.0000 mg | ORAL_TABLET | Freq: Every day | ORAL | Status: DC
Start: 2012-12-22 — End: 2012-12-31
  Administered 2012-12-22 – 2012-12-30 (×9): 20 mg via ORAL
  Filled 2012-12-22 (×9): qty 1

## 2012-12-22 MED ORDER — MYCOPHENOLATE MOFETIL 250 MG CAPSULE
500.0000 mg | ORAL_CAPSULE | Freq: Two times a day (BID) | ORAL | Status: DC
Start: 2012-12-22 — End: 2012-12-27
  Administered 2012-12-22 – 2012-12-27 (×11): 500 mg via ORAL
  Filled 2012-12-22 (×11): qty 2

## 2012-12-22 MED ORDER — METHYLPREDNISOLONE SODIUM SUCCINATE 125 MG/2 ML SOLUTION FOR INJECTION
125.0000 mg | Freq: Once | INTRAMUSCULAR | Status: AC
Start: 2012-12-23 — End: 2012-12-23
  Administered 2012-12-23: 125 mg via INTRAVENOUS
  Filled 2012-12-22: qty 2

## 2012-12-22 MED ORDER — MULTIVITAMIN TABLET
1.0000 | ORAL_TABLET | Freq: Every day | ORAL | Status: DC
Start: 2012-12-25 — End: 2012-12-31
  Administered 2012-12-25 – 2012-12-31 (×7): 1 via ORAL
  Filled 2012-12-22 (×7): qty 1

## 2012-12-22 MED ORDER — FUROSEMIDE 10 MG/ML INJECTION SOLUTION
120.0000 mg | Freq: Four times a day (QID) | INTRAMUSCULAR | Status: AC
Start: 2012-12-22 — End: 2012-12-23
  Administered 2012-12-22 – 2012-12-23 (×4): 120 mg via INTRAVENOUS
  Filled 2012-12-22 (×4): qty 12

## 2012-12-22 MED ORDER — HEPARIN, PORCINE (PF) 100 UNIT/ML INTRAVENOUS SYRINGE
3.0000 mL | INJECTION | INTRAVENOUS | Status: DC | PRN
Start: 2012-12-22 — End: 2012-12-31
  Filled 2012-12-22 (×2): qty 3

## 2012-12-22 MED ORDER — NACL 0.9% IV INFUSION
INTRAVENOUS | Status: DC
Start: 2012-12-22 — End: 2012-12-22

## 2012-12-22 MED ORDER — DIPHENHYDRAMINE 50 MG/ML INJECTION SOLUTION
25.0000 mg | INTRAMUSCULAR | Status: DC
Start: 2012-12-22 — End: 2012-12-28
  Administered 2012-12-23 – 2012-12-27 (×4): 25 mg via INTRAVENOUS
  Filled 2012-12-22 (×4): qty 1

## 2012-12-22 MED ORDER — CHOLECALCIFEROL (VITAMIN D3) 25 MCG (1,000 UNIT) TABLET
1000.0000 [IU] | ORAL_TABLET | Freq: Every day | ORAL | Status: DC
Start: 2012-12-25 — End: 2012-12-31
  Administered 2012-12-25 – 2012-12-31 (×7): 1000 [IU] via NASOGASTRIC
  Filled 2012-12-22 (×7): qty 1

## 2012-12-22 MED ORDER — INSULIN U-100 REGULAR HUMAN 100 UNIT/ML INJECTION SOLUTION
10.0000 [IU] | Freq: Once | INTRAMUSCULAR | Status: AC
Start: 2012-12-22 — End: 2012-12-22
  Administered 2012-12-22: 10 [IU] via INTRAVENOUS

## 2012-12-22 MED ORDER — DOCUSATE SODIUM 100 MG CAPSULE
100.0000 mg | ORAL_CAPSULE | Freq: Two times a day (BID) | ORAL | Status: DC
Start: 2012-12-25 — End: 2012-12-23

## 2012-12-22 MED ORDER — ACETAMINOPHEN 325 MG TABLET
650.0000 mg | ORAL_TABLET | ORAL | Status: DC
Start: 2012-12-22 — End: 2012-12-28
  Administered 2012-12-23 – 2012-12-27 (×4): 650 mg via ORAL
  Filled 2012-12-22 (×4): qty 2

## 2012-12-22 MED ORDER — AMPICILLIN 1 GRAM SOLUTION FOR INJECTION
1.0000 g | Freq: Two times a day (BID) | INTRAMUSCULAR | Status: DC
Start: 2012-12-22 — End: 2012-12-26
  Administered 2012-12-22 – 2012-12-26 (×8): 1 g via INTRAVENOUS
  Filled 2012-12-22 (×8): qty 1

## 2012-12-22 MED ORDER — DEXTROSE 50 % IN WATER (D50W) INTRAVENOUS SYRINGE
25.0000 mL | INJECTION | INTRAVENOUS | Status: DC | PRN
Start: 2012-12-22 — End: 2012-12-22

## 2012-12-22 MED ORDER — FENTANYL (PF) 50 MCG/ML INJECTION SOLUTION
25.0000 ug | INTRAMUSCULAR | Status: DC | PRN
Start: 2012-12-22 — End: 2012-12-22
  Administered 2012-12-22 (×4): 25 ug via INTRAVENOUS
  Filled 2012-12-22: qty 2

## 2012-12-22 MED ORDER — FUROSEMIDE 10 MG/ML INJECTION SOLUTION
10.0000 mg/h | INTRAMUSCULAR | Status: DC
Start: 2012-12-22 — End: 2012-12-22
  Administered 2012-12-22: 10 mg/h via INTRAVENOUS
  Filled 2012-12-22 (×2): qty 12

## 2012-12-22 MED ORDER — ASPIRIN 81 MG CHEWABLE TABLET
325.0000 mg | CHEWABLE_TABLET | Freq: Every day | ORAL | Status: DC
Start: 2012-12-23 — End: 2012-12-22

## 2012-12-22 MED ORDER — SULFAMETHOXAZOLE 400 MG-TRIMETHOPRIM 80 MG TABLET
1.0000 | ORAL_TABLET | Freq: Every day | ORAL | Status: DC
Start: 2012-12-26 — End: 2012-12-31
  Administered 2012-12-26 – 2012-12-31 (×6): 1 via ORAL
  Filled 2012-12-22 (×8): qty 1

## 2012-12-22 MED ORDER — DEPRECATED D5W 100ML
1.0000 g | Freq: Once | INTRAVENOUS | Status: AC
Start: 2012-12-22 — End: 2012-12-22
  Administered 2012-12-22: 1000 mg via INTRAVENOUS
  Filled 2012-12-22: qty 10

## 2012-12-22 MED ORDER — METOCLOPRAMIDE 5 MG/ML INJECTION SOLUTION
10.0000 mg | INTRAMUSCULAR | Status: AC | PRN
Start: 2012-12-22 — End: 2012-12-22
  Administered 2012-12-22: 10 mg via INTRAVENOUS
  Filled 2012-12-22: qty 2

## 2012-12-22 MED ORDER — FAMOTIDINE (PF) 20 MG/50 ML IN 0.9 % NACL (ISO) INTRAVENOUS PIGGYBACK
20.0000 mg | INJECTION | Freq: Two times a day (BID) | INTRAVENOUS | Status: DC
Start: 2012-12-22 — End: 2012-12-24
  Administered 2012-12-22 – 2012-12-23 (×3): 20 mg via INTRAVENOUS
  Filled 2012-12-22 (×3): qty 50

## 2012-12-22 MED ORDER — ONDANSETRON HCL (PF) 4 MG/2 ML INJECTION SOLUTION
4.0000 mg | Freq: Two times a day (BID) | INTRAMUSCULAR | Status: DC | PRN
Start: 2012-12-22 — End: 2012-12-26
  Administered 2012-12-23 – 2012-12-26 (×2): 4 mg via INTRAVENOUS
  Filled 2012-12-22 (×2): qty 2

## 2012-12-22 MED ORDER — NACL 0.9% IV BOLUS - DURATION REQ
100.0000 mL | INTRAVENOUS | Status: AC | PRN
Start: 2012-12-22 — End: 2012-12-23

## 2012-12-22 MED ORDER — NACL 0.45% IV INFUSION
INTRAVENOUS | Status: DC
Start: 2012-12-22 — End: 2012-12-23

## 2012-12-22 MED ORDER — DIPHENHYDRAMINE 50 MG/ML INJECTION SOLUTION
12.5000 mg | Freq: Four times a day (QID) | INTRAMUSCULAR | Status: DC | PRN
Start: 2012-12-22 — End: 2012-12-23
  Administered 2012-12-22: 12.5 mg via INTRAVENOUS
  Filled 2012-12-22: qty 1

## 2012-12-22 MED ORDER — ONDANSETRON HCL (PF) 4 MG/2 ML INJECTION SOLUTION
4.0000 mg | INTRAMUSCULAR | Status: AC | PRN
Start: 2012-12-22 — End: 2012-12-22
  Administered 2012-12-22: 4 mg via INTRAVENOUS
  Filled 2012-12-22: qty 2

## 2012-12-22 MED ORDER — METOPROLOL TARTRATE 25 MG TABLET
25.0000 mg | ORAL_TABLET | Freq: Two times a day (BID) | ORAL | Status: DC
Start: 2012-12-22 — End: 2012-12-23
  Administered 2012-12-22 – 2012-12-23 (×2): 25 mg via ORAL
  Filled 2012-12-22 (×3): qty 1

## 2012-12-22 MED ORDER — HYDROMORPHONE 1 MG/ML INJECTION SYRINGE
0.2000 mg | INJECTION | INTRAMUSCULAR | Status: DC | PRN
Start: 2012-12-22 — End: 2012-12-22
  Administered 2012-12-22: 0.2 mg via INTRAVENOUS
  Administered 2012-12-22: 0.4 mg via INTRAVENOUS
  Filled 2012-12-22: qty 1

## 2012-12-22 MED ORDER — HYDROMORPHONE 1 MG/ML INJECTION SYRINGE
0.2000 mg | INJECTION | INTRAMUSCULAR | Status: DC | PRN
Start: 2012-12-22 — End: 2012-12-23
  Administered 2012-12-22 – 2012-12-23 (×3): 0.2 mg via INTRAVENOUS
  Filled 2012-12-22 (×3): qty 1

## 2012-12-22 MED ORDER — ASPIRIN 81 MG CHEWABLE TABLET
81.0000 mg | CHEWABLE_TABLET | Freq: Every day | ORAL | Status: DC
Start: 2012-12-23 — End: 2012-12-31
  Administered 2012-12-23 – 2012-12-31 (×9): 81 mg via ORAL
  Filled 2012-12-22 (×9): qty 1

## 2012-12-22 MED ORDER — ACETAMINOPHEN 1,000 MG/100 ML (10 MG/ML) INTRAVENOUS SOLUTION
1000.0000 mg | Freq: Once | INTRAVENOUS | Status: AC
Start: 2012-12-22 — End: 2012-12-22
  Administered 2012-12-22: 1000 mg via INTRAVENOUS

## 2012-12-22 MED ORDER — GLUCAGON (HUMAN RECOMBINANT) 1 MG SOLUTION FOR INJECTION
1.0000 mg | INTRAMUSCULAR | Status: DC | PRN
Start: 2012-12-22 — End: 2012-12-23

## 2012-12-22 MED ORDER — INSULIN ASPART (U-100) 100 UNIT/ML (3 ML) SUBCUTANEOUS PEN
1.0000 [IU] | PEN_INJECTOR | Freq: Three times a day (TID) | SUBCUTANEOUS | Status: DC
Start: 2012-12-22 — End: 2012-12-23
  Administered 2012-12-22: 1 [IU] via SUBCUTANEOUS
  Administered 2012-12-22: 6 [IU] via SUBCUTANEOUS
  Administered 2012-12-23: 2 [IU] via SUBCUTANEOUS
  Filled 2012-12-22: qty 300

## 2012-12-22 MED ORDER — METHYLPREDNISOLONE SODIUM SUCCINATE 125 MG/2 ML SOLUTION FOR INJECTION
75.0000 mg | Freq: Once | INTRAMUSCULAR | Status: AC
Start: 2012-12-24 — End: 2012-12-24
  Administered 2012-12-24: 75 mg via INTRAVENOUS
  Filled 2012-12-22: qty 2

## 2012-12-22 MED ORDER — MEPERIDINE (PF) 50 MG/ML INJECTION SOLUTION
12.5000 mg | INTRAMUSCULAR | Status: DC | PRN
Start: 2012-12-22 — End: 2012-12-22

## 2012-12-22 MED ORDER — NACL 0.9 % DIALYSIS CIRCUIT FLUSH
100.0000 mL | INTRAVENOUS | Status: AC
Start: 2012-12-22 — End: 2012-12-23
  Administered 2012-12-22: 400 mL via INTRAVENOUS

## 2012-12-22 MED ORDER — HYDROMORPHONE 1 MG/ML INJECTION SYRINGE
0.2000 mg | INJECTION | INTRAMUSCULAR | Status: DC | PRN
Start: 2012-12-22 — End: 2012-12-22
  Administered 2012-12-22 (×2): 0.2 mg via INTRAVENOUS
  Filled 2012-12-22 (×2): qty 1

## 2012-12-22 MED ORDER — CEFTRIAXONE 1 GRAM SOLUTION FOR INJECTION
1.0000 g | INTRAMUSCULAR | Status: AC
Start: 2012-12-22 — End: 2012-12-22
  Administered 2012-12-22: 1 g via INTRAVENOUS
  Filled 2012-12-22: qty 1

## 2012-12-22 MED ORDER — FUROSEMIDE 10 MG/ML INJECTION SOLUTION
120.0000 mg | Freq: Once | INTRAMUSCULAR | Status: AC
Start: 2012-12-22 — End: 2012-12-22
  Administered 2012-12-22: 120 mg via INTRAVENOUS
  Filled 2012-12-22: qty 12

## 2012-12-22 MED ORDER — ASPIRIN 81 MG CHEWABLE TABLET
81.0000 mg | CHEWABLE_TABLET | Freq: Every day | ORAL | Status: DC
Start: 2012-12-23 — End: 2012-12-22

## 2012-12-22 NOTE — Nurse Assessment (Signed)
TRANSFER NOTE - RECEIVING    Note Started: 12/22/2012, 07:43     Report received from pacu. Patient received at 0720 hours from pacu unit by bed. Pt condition stable . patient oriented to room and unit. MD notified of patient's arrival on unit.  Plan of care reviewed and updated. Laurell Josephs, RN

## 2012-12-22 NOTE — Nurse Focus (Signed)
PACU ADMIT NURSING NOTE    Note Started: 12/22/2012, 07:00     Received patient from OR at 0303 hours via bed.  Monitor and Alarms on.  Patient sleepy but arousable. Jarold Motto, RN

## 2012-12-22 NOTE — Nurse Focus (Signed)
Report called to T8.

## 2012-12-22 NOTE — Procedures (Signed)
NPATIENT OPERATION RECORD     PATIENT NAME:  Charles Galvan     MR#:  [id number]    DATE OF BIRTH:  09-14-1966  DATE OF OPERATION:  12/22/2012    PREOPERATIVE DIAGNOSIS:    End-stage renal disease due to diabetes.    POSTOPERATIVE DIAGNOSIS: Same.    PROCEDURE AND TITLE OF SURGERY:    1. Renal transplantation of right cadaveric kidney into the left iliac fossa.  2. Renal transplantation of left cadaveric kidney into the left iliac fossa.  3. Insertion of 4.5-French x 8-cm double-J stent into the right kidney graft ureter.  4. Insertion of 4.5-French x 8-cm double-J stent into left kidney graft ureter.    HISTORY:    This 47yr-old patient with end-stage renal disease on hemodialysis was admitted to [facility] for renal transplantation. A blood-type and crossmatch compatible en bloc kidney graft from a 82.23 kg, 6-month-old brain dead baby boy donor had become available[place]. The donor had acute kidney injury with admission serum creatinine of 0.9 and terminal creatinine of 2.8 mg/dL.  Urine output was 10-50cc/hr throughout the hospital stay.  The donor was a kidney alone donor.  The kidney graft was accepted for transplantation. Please see the separately dictated Operative Report for the back table preparation of this en bloc graft. There were single ureters. We were planning to use the distal abdominal aorta and the distal inferior vena cava of the en bloc graft for a single arterial and a single venous anastomosis for this en bloc graft.    The kidneys had been preserved on ice during the transport to [place] had been placed on pump for approximately 11 hours prior to being removed from the pump for implantation into the recipient. Final pump parameters were acceptable with a flow of 69 mL/minute and a resistance of 0.31.    Preoperatively, informed consent was obtained. The patient had all questions answered, agreed, and gave informed consent.      INTRAOPERATIVE FINDINGS:    Minimally  diseased external iliac vasculature and normal bladder.  Previous hernia repair with mesh overlying the bladder.    DESCRIPTION OF SURGERY:    The patient was taken to the operating room, placed in supine position on the operating table. After induction of satisfactory general anesthesia, intravenous antibiotics were administered and a 22Fr Foley catheter was placed. The patient's abdomen was prepped and draped in usual sterile fashion. After adequate ABO blood type compatibility and UNOS ID number ([id number]) verification and an adequate surgical pause, we entered the patient's abdomen via a left lower quadrant oblique incision. The left iliac fossa was entered and immediately we came upon a mass of inflammatory tissue around the previously placed mesh for hernia repair.  We began our dissection laterally where we divided the oblique muscles and transversalis fascia to enter the retroperitoneal space.  We carried the dissection out isolating the psoas muscle and eventually the iliac vessels.  We dissected out the left external iliac artery and vein.  After complete mobilization, we then gave the recipient 4000 units of heparin intravenously systemically and crossclamped the external iliac vein after adequate circulation time of the heparin. We proceeded with the venous anastomoses, distal donor vena cava to external iliac vein, end to side, in standard fashion using circumferential 6-0 Prolene. The arterial anastomosis was then placed slightly distal to the venous anastomosis. The arterial anastamosis was performed from the distal donor aorta to the mid external iliac  artery end to side with circumferential 6-0 Prolene sutures.  After removal of clamps, the kidneys pinked up immediately and made a small amount of urine intraoperatively on the table. Meticulous hemostasis was obtained.  There was a very strong pulse in both renal arteries. There was bleeding from the cut ureteral ends.  Both renal veins filled  adequately, and the inferior vena cava, through which both veins were draining, filled adequately as well.    Once complete hemostasis was obtained, we turned our attention to the ureteral anastomoses. With some difficulty we divided the mesh over the bladder and successfully exposed the dome of the bladder.  The inferior epigastric vessels were ligated and divided.  We created two stented single-stitch uretero-neocystostomies with 5-0 Maxon suture.  The two ureters were brought into the bladder through a single tunnel.  Prior to completing the ureteral anastomoses, we inserted first a 4.5-French x  8-cm double-J stent into the right kidney graft ureter. Next, we inserted a 4.5-French x 8-cm double-J stent into the left kidney graft ureter.  We then reapproximated the bladder muscularis over both stented ureters at once using running 4-0 PDS suture.    At this point, we started closing the fascia using running #1 PDS suture. Skin edges were then reapproximated and a sterile dressing was applied.    COMPLICATIONS: There were no intraoperative complications. SPECIMENS: No specimens were sent to Surgical Pathology.     DISPOSITION: The Foley catheter is to remain in place until POD #5.    DONOR CROSSCLAMP TIME: 17:40PM on 12/20/2012.  KIDNEY OUT OF ICE TIME: 00:04AM on 12/22/2012.  REPERFUSION TIME: 00:52AM on 12/22/2012    IVF: 2900cc  EBL: 100cc  URINE OUTPUT: 0cc  .  SURGEON: Rise Patience, M.D.; Dept. of Surgery, [place]   ASSISTANT/RESIDENT SURGEON: Webb Silversmith, M.D.; Dept. of Surgery, [facility]     Electronically signed by: Rise Patience, MD, [facility]      PI# [phone]  Attending Surgeon; Department of Surgery, Division of Transplantation  [facility]

## 2012-12-22 NOTE — Progress Notes (Addendum)
Transplant Surgery Daily Progress Note        Date of Service: 12/22/2012        ID: 47yr old male with ESRD due to diabetic nephropathy s/p Peds-en-bloc DDRT    POD: 1    24 Hour Events:   Dialyzed pre-operatively for K of 6  Underwent above procedure, tolerated well  Low UOP since OR, foley gently flushed in PACU  Transferred to floor    Vital Signs   Summary  Temp src:  [-]   Temp:  [36.4 C (97.5 F)-37.4 C (99.3 F)]   Pulse:  [64-106]   BP: (119-213)/(50-106)   Resp:  [7-20]   SpO2:  [96 %-100 %]   Current Vitals  Temp: 37.4 C (99.3 F) (12/22/12 0830)  BP: 119/50 mmHg (12/22/12 0830) Pulse: 100  (12/22/12 0830)  Resp: 16  (12/22/12 0830)  SpO2: 97 % (12/22/12 0830)      Weight: 75.2 kg (165 lb 12.6 oz) (12/21/12 0840)     PHYSICAL EXAM  General: awake, alert, NAD  Eyes: non-icteric  Resp: breathing comfortably   CV: regular rate, regular rhythm  GI: abdomen soft and non distended, wound intact, no erythema  GU: foley draining clear pink-tinged urine  Neuro: Alert and oriented x 3  Ext: Palpable distal pulses bilaterally, no edema    Current CVP: 16  Today's Weight: Weight: 75.2 kg (165 lb 12.6 oz) (12/21/12 0840) kg  Admission Weight: 75.2 kg    I&O:  Clear Liquid Diet  03/02 0700 - 03/03 0659  In: 3746.2   Out: 425   UOP 20 cc/hr last 2 hours    Labs:  BASIC METABOLIC PANEL Recent labs for the past 72 hours     12/22/12 0318 12/21/12 0821    SODIUM 137 139    POTASSIUM 5.3* 6.0*    CHLORIDE 109 101    CARBON DIOXIDE TOTAL 19* 26    UREA NITROGEN, BLOOD (BUN) 34* 64*    CREATININE BLOOD 6.82* 10.30*    GLUCOSE 91 180*    CALCIUM 8.1* 10.4    MAGNESIUM (MG) 1.8 2.7*    PHOSPHORUS (PO4) -- 4.8    ALBUMIN -- 3.8      BLOOD COUNTS Recent labs for the past 72 hours     12/22/12 0318 12/21/12 0821    WHITE BLOOD CELL COUNT 6.9 8.6    HEMOGLOBIN 10.0* 11.6*    HEMATOCRIT 29.6* 34.5*    PLATELET COUNT 105* 155      PT   Recent labs for the past 72 hours     12/22/12 0318 12/21/12 0821    PROTHROMBIN TIME --  --    INR 1.08 0.94    APTT -- 25.6      LFTs Recent labs for the past 72 hours     12/21/12 0821    ASPARTATE TRANSAMINASE (AST) 29    ALANINE TRANSFERASE (ALT) 32    ALKALINE PHOSPHATASE (ALP) 69    ALBUMIN 3.8    PROTEIN 7.2    BILIRUBIN TOTAL 0.5    BILIRUBIN DIRECT <0.1      CARDIAC Recent labs for the past 72 hours     12/22/12 0318    D-DIMER --    CREATINE KINASE --    CK-MB --    TROPONIN I <0.01      POC Glucose: POC Glucose, blood:  [91 mg/dl-161 mg/dl]     Imaging From the Last 24 Hours:   Post  Op Ultrasound (12/22/12 @ 0349): Prelim - Normal blood flow to bilateral kidneys. No hydronephrosis. No peritransplant fluid collection. Normal arcuate artery waveforms and resistive indices in the bilateral kidneys. Normal waveforms and peak systolic velocities in the renal arteries and aorta. Patent venous drainage    Post Op Chest X ray (12/22/12 @ 0335): read pending      ASSESSMENT/PLAN:  33yr year old male s/p Peds-en-bloc Deceased Donor Renal Transplant    Graft: Slow Graft Function.    -Hemodialysis not necessary after transplantation.  -Give 120 mg lasix now, start lasix drip at 10 cc/hr    Immunosuppression:    Low Immunologic Risk: PRA 0%;   -Thymoglobulin 1.5 mg/kg for 3 total doses    -Thymo 125 mg IV, dose 2/3 today.   -Solumedrol 125 mg IV today 2/3 doses, then no more steroids  -Cellcept 500 mg PO BID  -Prograf POD2    Prophylaxis:    Low Risk CMV (CMV Donor negative, CMV Recipient positive)  -Ganciclovir 1.25 mg/kg IV q 24 hours  -Acyclovir after Thymo complete  -Bactrim SS one PO Daily for PJP prophylaxis on POD 4    F/E/N: Euvolemic.  -post op fluid protocol with D5 1/2 NS at 30 ml per hour maintenance and 1/2 NS ml for ml urine output replacement protocol  -Clear liquid diet    CV: Hypertension poorly controlled.  -Continue Metoprolol 50 mg PO BID.  -Continue post op telemetry, ASA, and Beta-blocker.    Pulm: No current pulmonary issues.  Continue IS, OOB.    GU: Foley Catheter in  place.  -plan for Foley removal on POD 4/day of discharge  -Flomax 0.4 mg po q hs for BPH symptoms.    Endocrine: Diabetes Mellitus Type II, POC Glucose, blood:  [91 mg/dl-161 mg/dl] , well-controlled.    -ISS    Neuro: Post-op pain well controlled.    -Dilaudid IV for breakthrough pain    Heme: CBC stable    Lines: L IJ    Wound: LLQ incision, no drain    Dispo: Ward care    Complications:   Low UOP post-op    Electronically Signed by:  Billy Coast, MD  General Surgery, PGY1  Transplant Service Pager: [phone]    This patient was seen, evaluated, and a care plan was developed with the resident. I agree with the findings and plan as outlined in Dr Cordelia Pen note above.    Vernice Jefferson, MD  ATTENDING TRANSPLANT SURGEON  PI [phone]

## 2012-12-22 NOTE — Procedures (Signed)
INPATIENT OPERATION RECORD     PATIENT NAME:  Edmund Holcomb     MR#:  [id number]    DATE OF BIRTH:  April 29, 1966  DATE OF OPERATION:  12/22/2012      PREOPERATIVE DIAGNOSIS:    Dual deceased donor kidney graft.    POSTOPERATIVE DIAGNOSIS: Same.    PROCEDURE AND TITLE OF SURGERY:    1. Backbench standard preparation of left cadaver donor renal allograft prior to transplantation (CPT code 45409).  2. Backbench standard preparation of right cadaver donor renal allograft prior to transplantation (CPT code 81191).  3. Arterial anastomosis during backbench reconstruction of dual cadaver donor renal allograft (47829).  4. Venous anastomosis during backbench reconstruction of dual cadaver donor renal allograft (CPT code 56213).    HISTORY:    This operative report covers the backbench standard preparation of this en bloc pediatric renal allograft that compromises a left and a right kidney allograft. The en bloc kidney graft was from a 29.35-kg, 53-month-old brain dead baby boy donor. Terminal donor serum creatinine was 2.8. mg/dL.     DESCRIPTION OF SURGERY:    After an adequate surgical pause and an adequate ABO blood type compatibility and UNOS ID number ([id number]) verification between the donor and the recipient, we removed the donor kidneys from the preservation container and placed them on ice on the back table. We then proceeded with backbench standard preparation of the left cadaver donor renal allograft prior to transplantation, including dissection and removal of perinephric fat, diaphragmatic and retroperitoneal attachments, as well as the preparation of the single left ureter. We then repeated this procedure by performing backbench standard preparation of the right cadaver donor renal allograft prior to transplantation, including dissection and removal of perinephric fat, diaphragmatic and right retroperitoneal attachments, and preparation of the single right ureter. Arterial  branches of the aorta were ligated as  necessary. Once this was accomplished, we ligated meticulously all lumbar branches coming off the aorta.  The celiac, SMA and IMA stumps were ligated. We clamped the distal iliac vessels and inserted a perfusion cannula into the proximal thoracic aorta and placed the en bloc kidney graft on the pulsatile perfusion pump.   Once the kidney was on pump we proceeded with the venous preparation. We inspected the ample suprarenal inferior vena cava cuff and anastomosed the IVC front-to-backwall using a vascular stapler, affording an excellent unimpeded outflow from both renal veins. We then ligated all lumbar branches off of the vena cava.  The kidneys were left on pulsatile perfusion until the time of transplantation.    Once the kidneys were ready for implantation we removed them from the pump and closed the proximal aorta.  We then did an arterial anastomosis in order to close off the suprarenal aortic cuff by approximating the suprarenal aorta 's front- to back-wall just above the SMA take-off by using a vascular stapler.  This was accomplished without any complications.     SURGEON: Rise Patience, M.D.; Dept. of Surgery, [place]  ASSISTANT/RESIDENT SURGEON: Webb Silversmith, M.D.; Dept. of Surgery, [facility]

## 2012-12-22 NOTE — Progress Notes (Addendum)
TRANSPLANT NEPHROLOGY PROGRESS NOTE  Name: Charles Galvan MRN: [id number]    Note Date and Time: 12/22/2012   Date of Admission: 12/21/2012  7:06 AM    Date of Service: 12/22/2012  Patient's PCP: No Pcp No Pcp      SUMMARY:    Charles Galvan is a 47yr old gentleman with history of stage V chronic kidney disease of unknown etiology but possibly attributed to diabetes who received a deceased donor renal transplant    (en-bloc kidneys from a pediatric donor, 84 months male with terminal creatinine of 2.6) on 12/22/12.  He has 0 % PRA and intermediate risk for CMV.  He did require one round of dialysis before his transplant surgery.    History antedating transplant is also remarkable for hypertension, diabetes mellitus, HTN, HLD, depression.    INTERVAL HISTORY:   Hemodynamically stable, blood pressure with fluctuations  Minimal urine output, currently receiving furosemide  Remains on liquids     SUBJECTIVE/REVIEW OF SYSTEMS:   Has not ambulated  Yet.  Denies fever or chills.  No shortness of breath, cough or expectoration.  Denies chest pain palpitations or light headedness.  . No nausea or vomiting. Has not passed flatus.   Has an indwelling bladder catheter.   No itching or skin rash.  No joint pain or swelling.  Has not noticed any swollen lymph glands.  Denies headache, tremor, weakness or numbness.  Mood normal.  Moderate incisional pain present.    Medications  Scheduled Medications     Acetaminophen (TYLENOL) Tablet 650 mg, ORAL, PRE-MED  Aspirin Chewable Tablet 81 mg, ORAL, QAM  Bisacodyl (DULCOLAX) EC Tablet 10 mg, ORAL, QID  Cholecalciferol (VITAMIN D3) Tablet/Capsule 1,000 Units, NG, QAM  DiphenhydrAMINE (BENADRYL) Injection 25 mg, IV, PRE-MED  Docusate (COLACE) Capsule 100 mg, ORAL, BID  FamoTIDine (PEPCID) 20 mg in NaCl 0.9% 50 mL IVPB, IV, PRE-MED  FamoTIDine (PEPCID) 20 mg in NaCl 0.9% 50 mL IVPB, IV, Q12H  Ganciclovir (CYTOVENE) 95 mg in NaCl 0.9% 100 mL IVPB, IV, Daily 2100  MethylPREDNISolone (SOLU-MEDROL)  Injection 125 mg, IV, ONCE  MethylPREDNISolone (SOLU-MEDROL) Injection 75 mg, IV, ONCE  Metoprolol Tartrate (LOPRESSOR) Tablet 50 mg, ORAL, BID  Multivitamin (HEXAVITAMIN) Tablet 1 tablet, ORAL, QAM  Mycophenolate (MMF) (CELLCEPT) Capsule 500 mg, ORAL, BID  Pravastatin (PRAVACHOL) Tablet 20 mg, ORAL, QAM  Trimethoprim 80 mg/Sulfamethoxazole 400 mg (BACTRIM SS) Tablet 1 tablet, ORAL, QAM    IV Medications  D5 / 0.45% NaCl, , IV, CONTINUOUS, Last Rate: 75 mL/hr at 12/22/12 0500  NaCl 0.45%, , IV, CONTINUOUS, Last Rate: 100 mL/hr at 12/22/12 0500  NaCl 0.45%, , IV, CONTINUOUS, Last Rate: Stopped (12/22/12 0500)  NaCl 0.9%, , IV, CONTINUOUS, Last Rate: Stopped (12/22/12 0400)    PRN Medications     Fentanyl (SUBLIMAZE) Injection 25 mcg, IV, Q5MIN PRN  Heparin (PF) 100 units/mL Flush Syringe 3 mL, Intercatheter, PRN  Hydromorphone (DILAUDID) Injection 0.2-0.4 mg, IV, Q15MIN PRN  Meperidine (DEMEROL) Injection 12.5 mg, IV, PRN  Metoclopramide (REGLAN) Injection 10 mg, IV, PRN X 1  Ondansetron (ZOFRAN) Injection 4 mg, IV, PRN X 1  Ondansetron (ZOFRAN) Injection 4 mg, IV, Q12H PRN        Allergies  No Known Allergies     Vital Signs Summary  Temp Min: 36.4 C (97.5 F) Max: 37.2 C (99 F)   Systolic (24hrs), Avg:160 mmHg, Min:119 mmHg, Max:213 mmHg     Diastolic (24hrs), Avg:76 mmHg, Min:51 mmHg, Max:106 mmHg  Pulse Min: 64  Max: 93   Temp: 37 C (98.6 F)  BP: 130/69 mmHg  Pulse: 93   Resp: 7   SpO2: 99 %  Flow (L/Min)(Oxygen Therapy): 4   Oxygen Concentration (%)(Therapy): (not recorded)  Weight: 75.2 kg (165 lb 12.6 oz)       Resp Min: 7  Max: 20   SpO2 Min: 96 % Max: 100 %  No Data Recorded     Intake and Output:  Current Shift:  In: 3233.2 [Crystalloid:3149.2; Colloid:84]  Out: 105 [Urine:5; Other:100]   Intake and Output: Last Two Completed Shifts:  I/O Last 2 Completed Shifts:  In: 200 [Oral:200]  Out: 200 [Urine:200]    PHYSICAL EXAM:    GENERAL APPEARANCE: Alert & Oriented, fatigued   EYES:  No  conjunctival pallor or scleral icterus. Pupils equal and reactive to light. Extraocular movements normal.   MOUTH:  Oral mucosa moist. No oral thrush or ulcers.   CARDIOVASCULAR:  No edema of lower extremities. Jugular venous pressure not elevated. Normal heart sounds. No murmurs.  RESPIRATORY:  Vesicular breath sounds anterior   GI:  Abdomen slightly distended, decrease bowel sounds heard.   GU: Left lower quadrant surgical incision clean & dry. Moderate tenderness over renal allograft. No bruit heard over allograft site.   MUSCULOSKELETAL: No joint swelling or deformities.  NERVOUS SYSTEM: No motor or sensory deficits. No tremor.  PSYCH:  Apropriate affect.    LAB TESTS/STUDIES:  BLOOD COUNTS   Recent labs for the past 48 hours     12/22/12 0318 12/21/12 0821    WHITE BLOOD CELL COUNT 6.9 8.6    RED CELL COUNT 3.09* 3.59*    HEMOGLOBIN 10.0* 11.6*    HEMATOCRIT 29.6* 34.5*    MCV 95.8 96.1    MCH 32.4 32.4    RDW 12.8 12.8    PLATELET COUNT 105* 155    NUCLEATED RBC/100 WBC -- --    BC COMMENTS -- --      CHEM 20   Recent labs for the past 48 hours     12/22/12 0318 12/21/12 0821    SODIUM 137 139    POTASSIUM 5.3* 6.0*    CHLORIDE 109 101    CARBON DIOXIDE TOTAL 19* 26    CREATININE BLOOD 6.82* 10.30*    UREA NITROGEN, BLOOD (BUN) 34* 64*    GLUCOSE 91 180*    CALCIUM 8.1* 10.4    PROTEIN -- 7.2    ALBUMIN -- 3.8    ALKALINE PHOSPHATASE (ALP) -- 69    ASPARTATE TRANSAMINASE (AST) -- 29    BILIRUBIN TOTAL -- 0.5    ALANINE TRANSFERASE (ALT) -- 32    TRIGLYCERIDE -- --    URIC ACID, BLOOD -- --    LACTATE DEHYDROGENASE -- --    PHOSPHORUS (PO4) -- 4.8    CHOLESTEROL -- --    MAGNESIUM (MG) 1.8 2.7*        ASSESSMENT AND RECOMMENDATIONS:    1. Deceased donor renal transplant recipient, post-operative day # 0  Has slow renal allograft function with minimal output.  No indications for dialysis from clinical or biochemical perspectives at the moment, can consider trial of IV lasix 120 mg x 1 to  see if he has any response.  Please check phos daily.    2. Immunosuppression  Non-sensitized  Will receive methylprednisolone and should get 5 doses of thymoglobulin given pediatric en-bloc transplant  Receiving MMF.Tacrolimus has not yet been started.  3. Hypertension  Blood pressure levels acceptable as patient has had fluctuations over night.  High readings intermittently exacrebated by pain.    4. Diabetes mellitus  Continue blood sugar monitoring.    5. Antimicrobial prophylaxis  Intermediate risk for CMV disease - receiving iv gancyclovir. Will be switched to oral valgancyclovir once antibody induction has been completed.  Will be started on trimethoprim/sulfamethoxyzole on post-operative day #4 for PJP and urinary infection prophylaxis.    6. Poor graft function. Likely to need dialysis if urine output does not pick up.    Electronically signed by:    Freeman Caldron   Renal Fellow  Pager: [phone]  PI: [id number]     TEACHING PHYSICIAN ATTESTATION  Patient seen and examined by me with the renal fellow. The history, physical exam and laboratory data were reviewed and confirmed by me.   We developed a care plan together as outlined in the fellow's note above.    Electronically signed by:  Michaele Offer, MD  Attending  [phone]  Department of Nephrology    12/22/2012 10:43

## 2012-12-22 NOTE — Nurse Focus (Signed)
Dialysis in progress at bedside, potassium was 7.5 this am. Remains in NSR, denies chest pain, amb. halls with nurse, tolerated well.  Afebrile, VSS.  Jenne Campus, RN

## 2012-12-22 NOTE — Progress Notes (Addendum)
ANESTHESIOLOGY POST OPERATIVE ASSESSMENT  Date: 12/22/2012 Time: 06:39   Date of Service (Patient contact): 12/22/12    46y M s/p en bloc renal transplant under GETA    VITAL SIGNS    Vital Signs (Last Recorded):  BP: 122/53 mmHg  Pulse: 97   Resp: 20   Temp Max: 37.2 C (99 F)  (Last 24 hours)  Temp: 37.1 C (98.8 F)  SpO2: 99 % on    O2 Device (Oxygen Therapy): nasal cannula    RESPIRATORY FUNCTION     Lungs:  normal, clear to auscultation bilaterally   Airway patency: Clear  Airway intervention: Oxgen Required,  Nasal Cannula     CARDIOVASCULAR FUNCTION    Cardiac:  regular    NEUROVASCULAR FUNCTION    Mental status: awake and alert    PAIN ASSESSMENT     pain Level Controlled    NAUSEA / VOMITING    Patient reported nausea, now well-controlled with anti-emetics administration.  Patients current PO status: NPO    POSTOPERATIVE HYDRATION    Intake/Output Summary (Last 24 hours) at 12/22/12 0639  Last data filed at 12/22/12 0600   Gross per 24 hour   Intake 3746.17 ml   Output    425 ml   Net 3321.17 ml     Current:   In: 3546.2 [Oral:10; Crystalloid:3324.2; Colloid:112; Irrigant:100]  Out: 225 [Urine:125; Other:100]     ADDITIONAL PERTINENT INFORMATION    Complications: no    Gilman Buttner, MD Resident  Electronically signed by:    Payton Doughty   Attending  Department of Pain and Anesthesiology  PI [id number], pgr [phone]

## 2012-12-22 NOTE — Progress Notes (Addendum)
Renal Services  Hemodialysis Summary  (see hemodialysis flowsheet for more details)    Date:    12/22/2012  Time:    1450  Duration:   2.5  hours  Dialysis #:   2    Dialyzer:  Optiflux 160  Access:  Left, Upper, arm , fistula    Fluid Balance:  Removed:   1900  mL  Given:   900 mL  Net Removed: 1000  mL    Kt/V:  Volume:  40.8 L  Delivered Kt/Vurea single pool: 0.87  Blood Volume Processed: 50  If Volume is unavailable report KTurea only: 36.9    Lab Results   Lab Name Value Date/Time    NA 131* 12/22/2012 11:28 AM    K 7.5* 12/22/2012 11:28 AM    CL 104 12/22/2012 11:28 AM    CO2 16* 12/22/2012 11:28 AM    BUN 46* 12/22/2012 11:28 AM    CR 7.69* 12/22/2012 11:28 AM    GLU 284* 12/22/2012 11:28 AM       Dialysis Solution:  Sodium:   138 mEq/L  Bicarbonate:   30 mEq/L  Potassium:   1 mEq/L  Calcium:   2.5 mEq/L  Phosphorus:  0 mg/dL    Anticoagulation:  Type:    saline  Load:    250  Infused:   650  Total:    900      Comment:   UF decreased to net 1 Liter to avoid drop in BP. Had a drop in BP once after pain med administration. Dr. Carlynn Purl from transplant surgery recommend a liter off. Dr. Newt Lukes notified.    To be completed by Attending Nephrologist:  Patient seen and evaluted 1 time(s) during this hemodialysis treatment.      Dr Michaele Offer  Transplant Nephrology  PI [id number]  [phone]

## 2012-12-22 NOTE — Progress Notes (Addendum)
Transplant Surgery Daily Progress Note        Date of Service: 12/23/2012        ID: 47yr old male with ESRD due to diabetic nephropathy s/p Peds-en-bloc DDRT    POD: 2    24 Hour Events:   - Started lasix drip for low UOP post-op, improved significantly  - Started lasix drip for low UOP.  Switched to 120 mg q6h x24 hrs.  UOP improved, 50-60 cc/hr overnight  - K 7.5 in afternoon, emergent HD, 1L removed.  K improved to 4.5, stable overnight    Vital Signs   Summary  Temp src:  [-]   Temp:  [37 C (98.6 F)-38.3 C (100.9 F)]   Pulse:  [80-105]   BP: (95-134)/(44-66)   Resp:  [15-19]   SpO2:  [95 %-99 %]   Current Vitals  Temp: 37.1 C (98.8 F) (12/23/12 1146)  BP: 122/58 mmHg (12/23/12 1146) Pulse: 85  (12/23/12 1146)  Resp: 16  (12/23/12 1146)  SpO2: 97 % (12/23/12 1146)      Weight: 79.3 kg (174 lb 13.2 oz) (12/23/12 0600)     PHYSICAL EXAM  General: awake, alert, NAD  Eyes: non-icteric  Resp: breathing comfortably   CV: regular rate, regular rhythm  GI: soft, non-distended, wound intact, no erythema  GU: foley draining dark red urine  Neuro: Alert and oriented x 3  Ext: Palpable distal pulses bilaterally, no edema    Current CVP: 8  Today's Weight: Weight: 79.3 kg (174 lb 13.2 oz) (12/23/12 0600) kg  Admission Weight: 75.2 kg    I&O:   03/03 0700 - 03/04 0659  In: 2676   Out: 1760   UOP 50-60 cc/hr overnight    Labs:  BASIC METABOLIC PANEL Recent labs for the past 72 hours     12/23/12 0500 12/22/12 2100 12/22/12 1128 12/22/12 0318 12/21/12 0821    SODIUM 132* 135 131* 137 139    POTASSIUM 4.4 4.5 7.5* 5.3* 6.0*    CHLORIDE 102 103 104 109 101    CARBON DIOXIDE TOTAL 23* 25 16* 19* 26    UREA NITROGEN, BLOOD (BUN) 32* 24* 46* 34* 64*    CREATININE BLOOD 6.31* 5.40* 7.69* 6.82* 10.30*    GLUCOSE 152* 117* 284* 91 180*    CALCIUM 7.2* 7.7* 7.5* 8.1* 10.4    MAGNESIUM (MG) -- 1.7 1.8 1.8 2.7*    PHOSPHORUS (PO4) 4.5 -- -- -- 4.8    ALBUMIN 2.6* -- -- -- 3.8      BLOOD COUNTS Recent labs for the past 72 hours      12/23/12 0500 12/22/12 2100 12/22/12 1128 12/22/12 0318 12/21/12 0821    WHITE BLOOD CELL COUNT 6.3 5.9 8.1 6.9 8.6    HEMOGLOBIN 8.3* 8.6* 9.9* 10.0* 11.6*    HEMATOCRIT 24.8* 25.2* 29.5* 29.6* 34.5*    PLATELET COUNT 92* 101* 113* 105* 155      PT   Recent labs for the past 72 hours     12/23/12 0500 12/22/12 0318 12/21/12 0821    PROTHROMBIN TIME -- -- --    INR -- 1.08 0.94    APTT 24.6 -- 25.6      LFTs Recent labs for the past 72 hours     12/23/12 0500 12/21/12 0821    ASPARTATE TRANSAMINASE (AST) 36 29    ALANINE TRANSFERASE (ALT) 28 32    ALKALINE PHOSPHATASE (ALP) 50 69    ALBUMIN 2.6* 3.8  PROTEIN 5.0* 7.2    BILIRUBIN TOTAL 0.4 0.5    BILIRUBIN DIRECT 0.1 <0.1       POC Glucose: POC Glucose, blood:  [131 mg/dl-212 mg/dl]     Imaging From the Last 24 Hours:   None      ASSESSMENT/PLAN:  54yr year old male s/p Peds-en-bloc Deceased Donor Renal Transplant    Graft: Delayed Graft Function.    -Hemodialysis necessary after transplantation, removed 1 L    Immunosuppression:    Low Immunologic Risk: PRA 0%;   -Thymoglobulin 1.5 mg/kg for 3 total doses    -Thymo 125 mg IV, dose 3/3 today.   -Solumedrol 75 mg IV today 3/3 doses, then no more steroids  -Cellcept 500 mg PO BID  -Prograf POD2    Prophylaxis:    Low Risk CMV (CMV Donor negative, CMV Recipient positive)  -Ganciclovir 1.25 mg/kg IV q 24 hours  -Acyclovir after Thymo complete  -Bactrim SS one PO Daily for PJP prophylaxis on POD 4    F/E/N: Euvolemic.  -post op fluid protocol with D5 1/2 NS at 30 ml per hour maintenance  -Renal diet    CV: Hypertension poorly controlled.  -Continue Metoprolol 50 mg PO BID.  -Continue post-op telemetry, ASA, and Beta-blocker.    Pulm: No current pulmonary issues.  Continue IS, OOB.    GU: Foley Catheter in place.  -plan for Foley removal on POD 4/day of discharge    Endocrine: Diabetes Mellitus Type II, POC Glucose, blood:  [131 mg/dl-212 mg/dl] , well-controlled.    -ISS    Neuro: Post-op pain well controlled.    -Dilaudid  IV for breakthrough pain    Heme: CBC stable    Lines: L IJ    Wound: LLQ incision, no drain    Dispo: Ward care    Complications:   None    Electronically Signed by:  Billy Coast, MD  General Surgery, PGY1  Transplant Service Pager: [phone]    This patient was seen, evaluated, and a care plan was developed with the resident. I agree with the findings and plan as outlined in Dr Cordelia Pen note above.    Vernice Jefferson, MD  ATTENDING TRANSPLANT SURGEON  PI [phone]

## 2012-12-23 MED ORDER — TACROLIMUS 1 MG CAPSULE, IMMEDIATE-RELEASE
4.0000 mg | ORAL_CAPSULE | Freq: Two times a day (BID) | ORAL | Status: DC
Start: 2012-12-24 — End: 2012-12-25
  Administered 2012-12-24 – 2012-12-25 (×2): 4 mg via ORAL
  Filled 2012-12-23 (×2): qty 4

## 2012-12-23 MED ORDER — DEXTROSE 50 % IN WATER (D50W) INTRAVENOUS SYRINGE
25.0000 mL | INJECTION | INTRAVENOUS | Status: DC | PRN
Start: 2012-12-23 — End: 2012-12-31

## 2012-12-23 MED ORDER — DOCUSATE SODIUM 100 MG CAPSULE
100.0000 mg | ORAL_CAPSULE | Freq: Two times a day (BID) | ORAL | Status: DC
Start: 2012-12-23 — End: 2012-12-31
  Administered 2012-12-23 – 2012-12-31 (×14): 100 mg via ORAL
  Filled 2012-12-23 (×16): qty 1

## 2012-12-23 MED ORDER — DEXTROSE 50 % IN WATER (D50W) INTRAVENOUS SYRINGE
50.0000 mL | INJECTION | INTRAVENOUS | Status: DC | PRN
Start: 2012-12-23 — End: 2012-12-31

## 2012-12-23 MED ORDER — HYDROMORPHONE 1 MG/ML INJECTION SYRINGE
0.2000 mg | INJECTION | INTRAMUSCULAR | Status: DC | PRN
Start: 2012-12-23 — End: 2012-12-24

## 2012-12-23 MED ORDER — METOPROLOL TARTRATE 25 MG TABLET
12.5000 mg | ORAL_TABLET | Freq: Two times a day (BID) | ORAL | Status: DC
Start: 2012-12-23 — End: 2012-12-25
  Administered 2012-12-23 – 2012-12-24 (×3): 12.5 mg via ORAL
  Filled 2012-12-23 (×3): qty 1

## 2012-12-23 MED ORDER — DIPHENHYDRAMINE 25 MG CAPSULE
25.0000 mg | ORAL_CAPSULE | Freq: Four times a day (QID) | ORAL | Status: DC | PRN
Start: 2012-12-23 — End: 2012-12-31
  Administered 2012-12-23 – 2012-12-24 (×2): 25 mg via ORAL
  Filled 2012-12-23 (×2): qty 1

## 2012-12-23 MED ORDER — DEPRECATED NACL 0.9% 250ML CARRIER
125.0000 mg | Freq: Once | INTRAVENOUS | Status: AC
Start: 2012-12-23 — End: 2012-12-23
  Administered 2012-12-23: 125 mg via INTRAVENOUS
  Filled 2012-12-23: qty 125

## 2012-12-23 MED ORDER — HYDROCODONE 5 MG-ACETAMINOPHEN 325 MG TABLET
1.0000 | ORAL_TABLET | ORAL | Status: DC | PRN
Start: 2012-12-23 — End: 2012-12-25
  Administered 2012-12-23 – 2012-12-25 (×6): 1 via ORAL
  Filled 2012-12-23 (×6): qty 1

## 2012-12-23 MED ORDER — INSULIN ASPART (U-100) 100 UNIT/ML (3 ML) SUBCUTANEOUS PEN
1.0000 [IU] | PEN_INJECTOR | Freq: Every day | SUBCUTANEOUS | Status: DC
Start: 2012-12-23 — End: 2012-12-27
  Administered 2012-12-23: 4 [IU] via SUBCUTANEOUS
  Administered 2012-12-24: 2 [IU] via SUBCUTANEOUS
  Administered 2012-12-24: 3 [IU] via SUBCUTANEOUS
  Administered 2012-12-25: 2 [IU] via SUBCUTANEOUS

## 2012-12-23 MED ORDER — GLUCAGON (HUMAN RECOMBINANT) 1 MG SOLUTION FOR INJECTION
1.0000 mg | INTRAMUSCULAR | Status: DC | PRN
Start: 2012-12-23 — End: 2012-12-31

## 2012-12-23 MED ORDER — INSULIN ASPART (U-100) 100 UNIT/ML (3 ML) SUBCUTANEOUS PEN
1.0000 [IU] | PEN_INJECTOR | Freq: Three times a day (TID) | SUBCUTANEOUS | Status: DC
Start: 2012-12-23 — End: 2012-12-27
  Administered 2012-12-23: 4 [IU] via SUBCUTANEOUS
  Administered 2012-12-23: 8 [IU] via SUBCUTANEOUS
  Administered 2012-12-24: 2 [IU] via SUBCUTANEOUS
  Administered 2012-12-24: 4 [IU] via SUBCUTANEOUS
  Administered 2012-12-24: 3 [IU] via SUBCUTANEOUS
  Administered 2012-12-25: 4 [IU] via SUBCUTANEOUS
  Administered 2012-12-25: 3 [IU] via SUBCUTANEOUS
  Administered 2012-12-26: 1 [IU] via SUBCUTANEOUS

## 2012-12-23 NOTE — Nurse Assessment (Signed)
ASSESSMENT NOTE    Note Started: 12/23/2012, 19:36     Initial assessment completed and recorded in EMR.  Report received from day shift nurse and orders reviewed. Plan of Care reviewed and appropriate, discussed with patient and family.  Daley Gosse Magnus Ivan, RN RN

## 2012-12-23 NOTE — Allied Health Progress (Signed)
CLINICAL SOCIAL SERVICES   ADULT PSYCHOSOCIAL ASSESSMENT  Note Date and Time: 12/23/2012    10:38  Date of Admission: 12/21/2012  7:06 AM    Date of Service: 12/23/2012 MRN: [id number]     Referral Source: medical team  Reason for Referral: Psychosocial assessment for s/p kidney transplant. The initial psychosocial assessment was completed on 07/17/2012, with a psychosocial re-evaluation completed on 09/30/2012, please refer for detailed information.  Patient Diagnosis/Reason for Medical Treatment:  S/P Pediatric En Bloc Kidney Transplant on 12/22/2012.  Spiritual/Cultural/Language Info: Christian/Hispanic/Englis & Spanish  Chart Reviewed: yes  Persons Interviewed: Patient    SOCIAL SITUATION    Marital Status: Married  Living Situation: home he rents in [place], [place]  Journalist, newspaper (e.g. conservator, payee, etc): n/a  Pt. resides with: his wife, Charles Galvan, age 46 and daughters, Charles Galvan, age 57 and Charles Galvan and his 2 grandchildren, age 90 and infant  Address: [address]  [place] [address]  Phone: [phone] (home)   Family involved/Support system: The patient identifies his primary support as his wife, Charles Galvan. She is not currently at bedside, but was at bedside all day yesterday. She is a homemaker and is able to provide transportation. She is primarily [place] speaking. He has additional support through his son, Charles Galvan, age 60. Social support does not appear to be a issue at this time.  Pt's education/occupation: college student attending [employer], studying Engineer, civil (consulting) support: SSDI  Insurance: Medi-Cal, Medicare    Substance Abuse:  The patient denies any past or current substance abuse issues.    Mental Health:  The patient has a hx of depression, without any SI or SA. He was prescribed Lexapro in January 2013 and reports that he is no longer taking it. He states that since he stopped the Lexapro he has not had any reoccurrence of depressive symptoms. He states that he is coping well with his  hospital stay and transplant.     Activity Level:  The patient is managing all his own ADL's. Prior to transplant he was exercising at the gym on the treadmill 3-4 times a week for 30-45 minutes and doing some light weight lifting.    Patient's Perceptions Regarding Illness, Treatment and Prognosis:  The patient denies that he has any barriers to learning. The patient is very sleepy and had a difficult time keeping his eyes open while speaking with the SW.  He has been given IV Benadryl and pain medications. He is moderately informed about transplant and his illness. His hope for transplant is to live his life and have a "second chance". He is [place] and [place] speaking. He had been on dialysis since 2012 and states that he was doing well with dialysis.    Family and Caregiver's Perceptions Regarding Illness, Treatment, and Prognosis:  The patient's wife is primarily Spanish speaking. She has requested from the pharmacist to have all reading materials in [place].     SIGNIFICANT PSYCHOSOCIAL CONCERNS:    None as of yet      ASSESSMENT    Patient: understanding of illness, tx and prognosis: good  support system: good  ability to cope with current situation: good  ability to utilize resources: good  caregiver's ability to manage patient's needs (if applicable): good    Family/Caregiver: Not at bedside    General Impression (e.g., affect, mood, behavior, presentation, etc.):  Patient was seen while sitting in a chair. He was in and out of sleep while speaking to the SW, due  to all his IV medications.      ACTION / PLAN    Consulted with medical team: yes    Counseling provided by [facility] Social Worker regarding: Other: Coping with recovery from transplant surgery and adjustment to transplant regimen.    Future Plan: social worker to provide follow-up through clinic/hospitalization as needed    Social Worker: Coral Spikes, [place]  LCSW    Pager #: [phone]

## 2012-12-23 NOTE — Nurse Assessment (Signed)
ASSESSMENT NOTE    Note Started: 12/23/2012, 07:53     Initial assessment completed and recorded in EMR.  Report received from night shift nurse and orders reviewed. Plan of Care reviewed and appropriate, discussed with patient.  Laurell Josephs, RN

## 2012-12-23 NOTE — Progress Notes (Addendum)
TRANSPLANT NEPHROLOGY PROGRESS NOTE  Name: Charles Galvan MRN: [id number]    Note Date and Time: 12/23/2012   Date of Admission: 12/21/2012  7:06 AM    Date of Service: 12/23/2012  Patient's PCP: No Pcp No Pcp      SUMMARY:    [name] is a 47yr old gentleman with history of stage V chronic kidney disease of unknown etiology but possibly attributed to diabetes who received a deceased donor renal transplant    (en-bloc kidneys from a pediatric donor, 31 months male with terminal creatinine of 2.6) on 12/22/12.  He has 0 % PRA and intermediate risk for CMV.  He did require one round of dialysis before his transplant surgery.    History antedating transplant is also remarkable for hypertension, diabetes mellitus, HTN, HLD, depression.    INTERVAL HISTORY:   Urgent dialysis yesterday given hyperkalemia with potassium of 7.5   Remains on lasix q6 hours high dose   Started on ampicillin IV    SUBJECTIVE/REVIEW OF SYSTEMS:   Denies fever or chills.  Ambulated earlier today  Tolerating solid diet.  No shortness of breath, cough or expectoration.  Denies chest pain palpitations or light headedness.  No nausea or vomiting. Has not passed flatus but no BM  Has an indwelling bladder catheter.   No itching or skin rash.  No joint pain or swelling.  Denies headache, tremor, weakness or numbness.  Mood normal.  Continues to have inicisonal pain     Medications  Scheduled Medications     Acetaminophen (TYLENOL) Tablet 650 mg, ORAL, PRE-MED  Ampicillin IV 1 g, IV, Q12H Now, Last Rate: 1 g (12/23/12 0449)  Antithymocyte Globulin (THYMOGLOBULIN) 125 mg in NaCl 0.9% 280 mL (Rabbit) (Central Line Administration) IVPB, IV, ONCE, Last Rate: 125 mg (12/23/12 1046)  Aspirin Chewable Tablet 81 mg, ORAL, QAM  Cholecalciferol (VITAMIN D3) Tablet/Capsule 1,000 Units, NG, QAM  DiphenhydrAMINE (BENADRYL) Injection 25 mg, IV, PRE-MED  Docusate (COLACE) Capsule 100 mg, ORAL, BID  FamoTIDine (PEPCID) 20 mg in NaCl 0.9% 50 mL IVPB, IV, PRE-MED, Last  Rate: 20 mg (12/23/12 1012)  FamoTIDine (PEPCID) 20 mg in NaCl 0.9% 50 mL IVPB, IV, Q12H, Last Rate: 20 mg (12/22/12 2114)  FUROsemide (LASIX) 120 mg in D5W 50 mL IVPB, IV, Q6H, Last Rate: 120 mg (12/23/12 1142)  Ganciclovir (CYTOVENE) 95 mg in NaCl 0.9% 100 mL IVPB, IV, Daily 2100, Last Rate: 95 mg (12/22/12 2212)  Insulin Aspart (NOVOLOG) Injection Pen 1-12 Units, SUBCUTANEOUS, TID w/ meals - correction dose  Insulin Aspart (NOVOLOG) Injection Pen 1-4 Units, SUBCUTANEOUS, Daily Bedtime and 0300  MethylPREDNISolone (SOLU-MEDROL) Injection 75 mg, IV, ONCE  Metoprolol Tartrate (LOPRESSOR) Tablet 12.5 mg, ORAL, BID  Multivitamin (HEXAVITAMIN) Tablet 1 tablet, ORAL, QAM  Mycophenolate (MMF) (CELLCEPT) Capsule 500 mg, ORAL, BID  Pravastatin (PRAVACHOL) Tablet 20 mg, ORAL, QAM  Trimethoprim 80 mg/Sulfamethoxazole 400 mg (BACTRIM SS) Tablet 1 tablet, ORAL, QAM    IV Medications  D5 / 0.45% NaCl, , IV, CONTINUOUS, Last Rate: 30 mL/hr at 12/23/12 0605    PRN Medications     Dextrose 50% Injection 12.5 g, IV, PRN  Dextrose 50% Injection 25 g, IV, PRN  DiphenhydrAMINE (BENADRYL) Capsule 25 mg, ORAL, Q6H PRN  Glucagon Injection 1 mg, IM, PRN  Heparin (PF) 100 units/mL Flush Syringe 3 mL, Intercatheter, PRN  Hydrocodone 5 mg/Acetaminophen 325 mg (NORCO  5) Tablet 1 tablet, ORAL, Q4H PRN  Hydromorphone (DILAUDID) Injection 0.2 mg, IV, Q4H PRN  Ondansetron (ZOFRAN) Injection 4 mg, IV, Q12H PRN        Allergies  No Known Allergies     Vital Signs Summary  Temp Min: 37 C (98.6 F) Max: 38.3 C (100.9 F)   Systolic (24hrs), Avg:160 mmHg, Min:119 mmHg, Max:213 mmHg     Diastolic (24hrs), Avg:76 mmHg, Min:51 mmHg, Max:106 mmHg    Pulse Min: 80  Max: 105   Temp: 37.1 C (98.8 F)  BP: 122/58 mmHg  Pulse: 85   Resp: 16   SpO2: 97 %  Flow (L/Min)(Oxygen Therapy): 2   Oxygen Concentration (%)(Therapy): (not recorded)  Weight: 79.3 kg (174 lb 13.2 oz)       Resp Min: 15  Max: 19   SpO2 Min: 95 % Max: 99 %  No Data Recorded      Intake and Output:  Current Shift:  In: 581.8 [Oral:250; Crystalloid:331.8]  Out: 200 [Urine:200]   Intake and Output: Last Two Completed Shifts:  I/O Last 2 Completed Shifts:  In: 2676 [Oral:870; Crystalloid:1570; Colloid:56; Irrigant:180]  Out: 1760 [Urine:740; Emesis:20]    PHYSICAL EXAM:    GENERAL APPEARANCE: Alert & Oriented, sitting up in a chair not in distress  EYES:  No conjunctival pallor or scleral icterus. Extraocular movements normal.   MOUTH:  Oral mucosa moist. No oral thrush or ulcers.   CARDIOVASCULAR:  No edema of lower extremities. Jugular venous pressure not elevated. Normal heart sounds. No murmurs.  RESPIRATORY:  Mild crackles at the bases   GI:  Abdomen distended, decrease bowel sounds heard.   GU: Left lower quadrant surgical incision clean & dry. Mild tenderness over renal allograft.  MUSCULOSKELETAL: No joint swelling or deformities.  NERVOUS SYSTEM: No motor or sensory deficits. No tremor.  PSYCH:  Apropriate affect.    LAB TESTS/STUDIES:  BLOOD COUNTS   Recent labs for the past 48 hours     12/23/12 0500 12/22/12 2100 12/22/12 1128 12/22/12 0318    WHITE BLOOD CELL COUNT 6.3 5.9 8.1 6.9    RED CELL COUNT 2.58* 2.65* 3.08* 3.09*    HEMOGLOBIN 8.3* 8.6* 9.9* 10.0*    HEMATOCRIT 24.8* 25.2* 29.5* 29.6*    MCV 96.1 95.2 95.7 95.8    MCH 32.0 32.5 32.1 32.4    RDW 12.9 13.2 12.9 12.8    PLATELET COUNT 92* 101* 113* 105*    NUCLEATED RBC/100 WBC -- -- -- --    BC COMMENTS -- -- -- --      CHEM 20   Recent labs for the past 48 hours     12/23/12 0500 12/22/12 2100 12/22/12 1128 12/22/12 0318    SODIUM 132* 135 131* 137    POTASSIUM 4.4 4.5 7.5* 5.3*    CHLORIDE 102 103 104 109    CARBON DIOXIDE TOTAL 23* 25 16* 19*    CREATININE BLOOD 6.31* 5.40* 7.69* 6.82*    UREA NITROGEN, BLOOD (BUN) 32* 24* 46* 34*    GLUCOSE 152* 117* 284* 91    CALCIUM 7.2* 7.7* 7.5* 8.1*    PROTEIN 5.0* -- -- --    ALBUMIN 2.6* -- -- --    ALKALINE PHOSPHATASE (ALP) 50 -- -- --    ASPARTATE TRANSAMINASE (AST) 36  -- -- --    BILIRUBIN TOTAL 0.4 -- -- --    ALANINE TRANSFERASE (ALT) 28 -- -- --    TRIGLYCERIDE -- -- -- --    URIC ACID, BLOOD -- -- -- --    LACTATE DEHYDROGENASE -- -- -- --  PHOSPHORUS (PO4) 4.5 -- -- --    CHOLESTEROL -- -- -- --    MAGNESIUM (MG) -- 1.7 1.8 1.8        ASSESSMENT AND RECOMMENDATIONS:    1. Deceased donor renal transplant recipient, post-operative day # 1  Has delayed renal allograft function with minimal output, required dialysis on 12/22/12 post transplant given hyperkalemia.  No indications for dialysis from clinical or biochemical perspectives today, would recommend stopping IV lasix if still no significant improvement in urine output today.    2. Immunosuppression  Non-sensitized  Will receive methylprednisolone and should get 2/5 doses of thymoglobulin today given pediatric en-bloc transplant  Receiving MMF.Tacrolimus has not yet been started.    3. Hypertension  Blood pressure levels acceptable, agree with dose reduction in metoprolol.    4. Diabetes mellitus  Continue blood sugar monitoring.    5. Antimicrobial prophylaxis  Intermediate risk for CMV disease - receiving iv gancyclovir. Will be switched to oral valgancyclovir once antibody induction has been completed.  Will be started on trimethoprim/sulfamethoxyzole on post-operative day #4 for PJP and urinary infection prophylaxis.    Electronically signed by:    Freeman Caldron   Renal Fellow  Pager: [phone]  PI: [id number]     TEACHING PHYSICIAN ATTESTATION  Patient seen and examined by me with the renal fellow. The history, physical exam and laboratory data were reviewed and confirmed by me. We developed a care plan together as outlined in the fellow's note above.    Electronically signed by:  Michaele Offer, MD  Attending  [phone]  Department of Nephrology    12/23/2012 14:51

## 2012-12-23 NOTE — Nurse Assessment (Signed)
ASSESSMENT NOTE    Note Started: 12/22/2012, 1900     Initial assessment completed and recorded in EMR.  Report received from day shift nurse and orders reviewed. Plan of Care reviewed and appropriate, discussed with patient. Pt NAD laying in bed, denies pain at this time. RN encouraged IS use. RLQ inc well approximated, with liquid dermabond. Foley with scant red urine output.  Will continue fluid replacement per order, and hourly VS per transplant protocol. Call light within reach, will continue to monitor. Lutricia Horsfall, RN RN

## 2012-12-24 ENCOUNTER — Telehealth: Payer: Self-pay

## 2012-12-24 MED ORDER — SENNOSIDES 8.6 MG TABLET
2.0000 | ORAL_TABLET | Freq: Every day | ORAL | Status: DC
Start: 2012-12-24 — End: 2012-12-31
  Administered 2012-12-24 – 2012-12-30 (×7): 17.2 mg via ORAL
  Filled 2012-12-24 (×7): qty 2

## 2012-12-24 MED ORDER — POLYETHYLENE GLYCOL 3350 17 GRAM ORAL POWDER PACKET
17.0000 g | Freq: Every day | ORAL | Status: DC
Start: 2012-12-24 — End: 2012-12-31
  Administered 2012-12-24 – 2012-12-31 (×5): 17 g via ORAL
  Filled 2012-12-24 (×7): qty 1

## 2012-12-24 MED ORDER — FAMOTIDINE 20 MG TABLET
20.0000 mg | ORAL_TABLET | Freq: Two times a day (BID) | ORAL | Status: DC
Start: 2012-12-24 — End: 2012-12-31
  Administered 2012-12-24 – 2012-12-31 (×15): 20 mg via ORAL
  Filled 2012-12-24 (×15): qty 1

## 2012-12-24 MED ORDER — DEPRECATED NACL 0.9% 250ML CARRIER
125.0000 mg | Freq: Once | INTRAVENOUS | Status: AC
Start: 2012-12-24 — End: 2012-12-24
  Administered 2012-12-24: 125 mg via INTRAVENOUS
  Filled 2012-12-24: qty 125

## 2012-12-24 NOTE — Progress Notes (Addendum)
Transplant Surgery Daily Progress Note        Date of Service: 12/24/2012        ID: 47yr old male with ESRD due to diabetic nephropathy s/p Peds-en-bloc DDRT    POD: 2 (reperfusion time 0052 12/22/12)    24 Hour Events:  Stopped IV lasix  UOP improved, 75 cc/hr overnight  Started Ampicillin for donor UTI (Enterococcus faecalis)  Started post-transplant teaching with SW    Vital Signs   Summary  Temp src:  [-]   Temp:  [36.8 C (98.2 F)-37.1 C (98.8 F)]   Pulse:  [80-98]   BP: (95-166)/(47-81)   Resp:  [15-18]   SpO2:  [95 %-99 %]   Current Vitals  Temp: 36.9 C (98.5 F) (12/24/12 0325)  BP: 150/71 mmHg (12/24/12 0325) Pulse: 98  (12/24/12 0325)  Resp: 18  (12/24/12 0325)  SpO2: 98 % (12/24/12 0325)      Weight: 79.2 kg (174 lb 9.7 oz) (12/24/12 0500)     Today's Weight: Weight: 79.2 kg (174 lb 9.7 oz) (12/24/12 0500) kg  Admission Weight: 75.2 kg    I&O:   03/04 0700 - 03/05 0659  In: 2207.3   Out: 1470   UOP 875 cc overnight    PHYSICAL EXAM  General: awake, alert, NAD  Eyes: non-icteric  Resp: breathing comfortably   CV: regular rate, regular rhythm  GI: soft, non-distended, wound intact, no erythema  GU: foley draining dark red urine  Neuro: Alert and oriented x 3  Ext: Palpable distal pulses bilaterally, no edema    Labs:  BASIC METABOLIC PANEL Recent labs for the past 72 hours     12/24/12 0445 12/23/12 0500 12/22/12 2100 12/22/12 1128 12/22/12 0318 12/21/12 0821    SODIUM 132* 132* 135 131* 137 139    POTASSIUM 4.3 4.4 4.5 7.5* 5.3* 6.0*    CHLORIDE 101 102 103 104 109 101    CARBON DIOXIDE TOTAL 22* 23* 25 16* 19* 26    UREA NITROGEN, BLOOD (BUN) 50* 32* 24* 46* 34* 64*    CREATININE BLOOD 8.27* 6.31* 5.40* 7.69* 6.82* 10.30*    GLUCOSE 206* 152* 117* 284* 91 180*    CALCIUM 7.1* 7.2* 7.7* 7.5* 8.1* 10.4    MAGNESIUM (MG) -- -- 1.7 1.8 1.8 2.7*    PHOSPHORUS (PO4) 5.6* 4.5 -- -- -- 4.8    ALBUMIN 2.7* 2.6* -- -- -- 3.8      BLOOD COUNTS Recent labs for the past 72 hours     12/23/12 0500 12/22/12 2100 12/22/12  1128 12/22/12 0318 12/21/12 0821    WHITE BLOOD CELL COUNT 6.3 5.9 8.1 6.9 8.6    HEMOGLOBIN 8.3* 8.6* 9.9* 10.0* 11.6*    HEMATOCRIT 24.8* 25.2* 29.5* 29.6* 34.5*    PLATELET COUNT 92* 101* 113* 105* 155      PT   Recent labs for the past 72 hours     12/24/12 0445 12/23/12 0500 12/22/12 0318 12/21/12 0821    PROTHROMBIN TIME -- -- -- --    INR -- -- 1.08 0.94    APTT 23.5* 24.6 -- 25.6      LFTs Recent labs for the past 72 hours     12/24/12 0445 12/23/12 0500 12/21/12 0821    ASPARTATE TRANSAMINASE (AST) 26 36 29    ALANINE TRANSFERASE (ALT) 18 28 32    ALKALINE PHOSPHATASE (ALP) 52 50 69    ALBUMIN 2.7* 2.6* 3.8    PROTEIN 5.8*  5.0* 7.2    BILIRUBIN TOTAL 0.5 0.4 0.5    BILIRUBIN DIRECT <0.1 0.1 <0.1       POC Glucose: POC Glucose, blood:  [158 mg/dl-351 mg/dl]     Imaging From the Last 24 Hours:   None      ASSESSMENT/PLAN:  53yr year old male s/p Peds-en-bloc Deceased Donor Renal Transplant    Graft: Delayed Graft Function.    -Hemodialysis necessary for hyperkalemia after transplantation  -No additional HD needed at this time  -Continue to monitor    Immunosuppression:    Low Immunologic Risk: PRA 0%;   -Thymoglobulin 1.5 mg/kg for 5 total doses    -Thymo 125 mg IV, dose 4/5 today.   -Solumedrol 75 mg IV today 3/3 doses, then no more steroids  -Cellcept 500 mg PO BID  -Prograf POD2    Prophylaxis:    Low Risk CMV (CMV Donor negative, CMV Recipient positive)  -Ganciclovir 1.25 mg/kg IV q 24 hours  -Acyclovir after Thymo complete  -Bactrim SS one PO Daily for PJP prophylaxis on POD 4  -Ampicillin for donor UTI    F/E/N: Euvolemic.  -post op fluid protocol with D5 1/2 NS at 30 ml per hour maintenance  -Renal diet    CV: Hypertension poorly controlled.  -Continue Metoprolol 50 mg PO BID.  -Continue post-op telemetry, ASA, and Beta-blocker.    Pulm: No current pulmonary issues.  Continue IS, OOB.    GU: Foley Catheter in place.  -plan for Foley removal on POD 4/day of discharge    Endocrine: Diabetes Mellitus Type II,  POC Glucose, blood:  [158 mg/dl-351 mg/dl] , poorly-controlled.    -ISS    Neuro: Post-op pain well controlled.    -Dilaudid IV for breakthrough pain    Heme: CBC stable    Lines: L IJ    Wound: LLQ incision, no drain    Dispo: Ward care    Complications:   None    Electronically Signed by:  Billy Coast, MD  General Surgery, PGY1  Transplant Service Pager: [phone]    This patient was seen, evaluated, and a care plan was developed with the resident. I agree with the findings and plan as outlined in Dr Cordelia Pen note above.    Vernice Jefferson, MD  ATTENDING TRANSPLANT SURGEON  PI [phone]

## 2012-12-24 NOTE — Telephone Encounter (Signed)
Charles Galvan is a recent renal transplant. It is anticipated that short term hemodialysis will be needed following discharge.    I have contacted Charles Galvan's current dialysis unit, [facility] in [place]. Ph- [phone].    I have spoken to charge RN, Collier Flowers whom confirms his usual chair time is M-W-F fist shift which starts ~430AM. Collier Flowers has confirmed that his chair will be saved.

## 2012-12-24 NOTE — Plan of Care (Signed)
Problem: Kidney Transplant (Adult)  Goal: Prevent/Manage Potential Problems (Kidney Transplant (Adult))  Outcome: Signs and symptoms of listed potential problems will be absent or manageable (reference CPG)   Outcome: Progressing  Slow graft function BUN 50, Cr 8.27.

## 2012-12-24 NOTE — Allied Health Progress (Signed)
CLINICAL SOCIAL SERVICES ADULT PROGRESS NOTE  Note Date and Time: 12/24/2012    12:13  Date of Admission: 12/21/2012  7:06 AM    Date of Service: 12/24/2012      Patient's Diagnosis/Reason for Medical Tx: S/P Pediatric En Bloc Kidney Transplant on 12/22/2012.  Chart Reviewed: yes  Interpreter Assisting with Interview: no  Persons Interviewed: Patient    Met with patient for f/u. Patient states that he is doing well. He reports that the transplant education has started, but he has not yet started to learn his medications. He could not explain why. He was encouraged to start learning his medications. He states that his wife has started learning the medications, but needs to learn in [place]. He denies any barriers to learning. He is currently taking Insurance claims handler classes at [facility]. He reports that he is coping fair with transplant and his hospital stay. His wife is coming in a short while for transplant education. He denies any depression or anxiety. He states that he has been ambulating well and is not very hungry, so he has not been eating a whole lot. He denies any nauseas and vomiting.  SW will continue to follow.      General Impression (e.g. Affect, mood, behaviour, presentation, etc.)  Patient was sitting in a chair near his bed while speaking with SW. His mood is euthymic and affect is congruent with his mood. His memory is intact. Thought process and content are WNL.         ACTION / PLAN  Consulted with medical team: yes  Counseling Provided by [facility] Social Worker re: Coping with recovery from transplant surgery and adjustment to transplant regimen.  Future Plan: Social worker to provide follow-up through hospitalization as needed.    Social Worker: Coral Spikes, [place]   Pager #: [phone]

## 2012-12-24 NOTE — Nurse Assessment (Signed)
ASSESSMENT NOTE    Note Started: 12/24/2012, 19:46     Initial assessment completed and recorded in EMR.  Report received from day shift nurse and orders reviewed. Plan of Care reviewed and appropriate, discussed with patient. Assumed care. Pt is AAOX4, RA, BS clear and equal. Denies SOB.  Does not appear to be in any distress.  2000 ml on the IS  X 10.  DL CL to the left neck with Thymo infusing.  ABD incision OTA with dermabond, well approximated, no drainage noted. FC to gravity with hematuric urine.  RN emptied 150 at this time.  VS WDL.  Call light and personal items in reach.  RN will monitor.   Stanford Scotland RN

## 2012-12-24 NOTE — Nurse Assessment (Signed)
ASSESSMENT NOTE    Note Started: 12/24/2012, 11:36     Initial assessment completed at 0800 and recorded in EMR.  Report received from night shift nurse and orders reviewed. Plan of Care reviewed and appropriate, discussed with patient.  S/P peds enbloc DDRT POD # 3 slow graft function. BUN 50, Cr 8.27, K 4.3, H/H 8.3/24.8 On tele NSR no reports of CP or SOB. OOB to chair with stand by assist. Passing flatus No BM at this time. Will continue to review transplant teaching and monitor patient's condition. [name], RN RN

## 2012-12-24 NOTE — Progress Notes (Addendum)
TRANSPLANT NEPHROLOGY PROGRESS NOTE  Name: Charles Galvan MRN: [id number]    Note Date and Time: 12/24/2012   Date of Admission: 12/21/2012  7:06 AM    Date of Service: 12/24/2012  Patient's PCP: No Pcp No Pcp      SUMMARY:    [name] is a 47yr old gentleman with history of stage V chronic kidney disease of unknown etiology but possibly attributed to diabetes who received a deceased donor renal transplant  (en-bloc kidneys from a pediatric donor, 37 months male with terminal creatinine of 2.6) on 12/22/12.  He has 0 % PRA and intermediate risk for CMV.  He did require one round of dialysis before and after his transplant surgery.    History antedating transplant is also remarkable for hypertension, diabetes mellitus, HTN, HLD, depression.    INTERVAL HISTORY:   No dialysis needs overnight   Lasix was held today  Made about 1.4 L of urine     SUBJECTIVE/REVIEW OF SYSTEMS:   Denies fever but having chills   Ambulated earlier today  Tolerating solid diet.  No shortness of breath, cough or expectoration.  Denies chest pain palpitations or light headedness.  No nausea or vomiting. Has not passed flatus and BM  Has an indwelling bladder catheter.   No itching or skin rash.  No joint pain or swelling.  Denies headache, tremor, weakness or numbness.  Mood normal.  Continues to have inicisonal pain   Hematuria persists requiring requiring foley flush X1.    Medications  Scheduled Medications     Acetaminophen (TYLENOL) Tablet 650 mg, ORAL, PRE-MED  Ampicillin IV 1 g, IV, Q12H Now, Last Rate: 1 g (12/24/12 0533)  Antithymocyte Globulin (THYMOGLOBULIN) 125 mg in NaCl 0.9% 280 mL (Rabbit) (Central Line Administration) IVPB, IV, ONCE  Aspirin Chewable Tablet 81 mg, ORAL, QAM  Cholecalciferol (VITAMIN D3) Tablet/Capsule 1,000 Units, NG, QAM  DiphenhydrAMINE (BENADRYL) Injection 25 mg, IV, PRE-MED  Docusate (COLACE) Capsule 100 mg, ORAL, BID  FamoTIDine (PEPCID) 20 mg in NaCl 0.9% 50 mL IVPB, IV, PRE-MED, Last Rate: 20 mg (12/23/12  1012)  FamoTIDine (PEPCID) Tablet 20 mg, ORAL, BID  Ganciclovir (CYTOVENE) 95 mg in NaCl 0.9% 100 mL IVPB, IV, Daily 2100, Last Rate: 95 mg (12/23/12 2122)  Insulin Aspart (NOVOLOG) Injection Pen 1-12 Units, SUBCUTANEOUS, TID w/ meals - correction dose  Insulin Aspart (NOVOLOG) Injection Pen 1-4 Units, SUBCUTANEOUS, Daily Bedtime and 0300  MethylPREDNISolone (SOLU-MEDROL) Injection 75 mg, IV, ONCE  Metoprolol Tartrate (LOPRESSOR) Tablet 12.5 mg, ORAL, BID  Multivitamin (HEXAVITAMIN) Tablet 1 tablet, ORAL, QAM  Mycophenolate (MMF) (CELLCEPT) Capsule 500 mg, ORAL, BID  Polyethylene Glycol 3350 (MIRALAX) Oral Powder Packet 17 g, ORAL, QAM  Pravastatin (PRAVACHOL) Tablet 20 mg, ORAL, QAM  Sennosides (SENOKOT) Tablet 17.2 mg, ORAL, Daily Bedtime  Tacrolimus (FK-506) (PROGRAF) Capsule 4 mg, ORAL, BID (06,18)  Trimethoprim 80 mg/Sulfamethoxazole 400 mg (BACTRIM SS) Tablet 1 tablet, ORAL, QAM    IV Medications   PRN Medications     Dextrose 50% Injection 12.5 g, IV, PRN  Dextrose 50% Injection 25 g, IV, PRN  DiphenhydrAMINE (BENADRYL) Capsule 25 mg, ORAL, Q6H PRN  Glucagon Injection 1 mg, IM, PRN  Heparin (PF) 100 units/mL Flush Syringe 3 mL, Intercatheter, PRN  Hydrocodone 5 mg/Acetaminophen 325 mg (NORCO  5) Tablet 1 tablet, ORAL, Q4H PRN  Ondansetron (ZOFRAN) Injection 4 mg, IV, Q12H PRN        Allergies  No Known Allergies     Vital  Signs Summary  Temp Min: 36.6 C (97.9 F) Max: 37 C (98.6 F)   Systolic (24hrs), Avg:160 mmHg, Min:119 mmHg, Max:213 mmHg     Diastolic (24hrs), Avg:76 mmHg, Min:51 mmHg, Max:106 mmHg    Pulse Min: 85  Max: 98   Temp: 36.6 C (97.9 F)  BP: 134/61 mmHg  Pulse: 85   Resp: 20   SpO2: 99 %  Flow (L/Min)(Oxygen Therapy): 2   Oxygen Concentration (%)(Therapy): (not recorded)  Weight: 79.2 kg (174 lb 9.7 oz)       Resp Min: 16  Max: 20   SpO2 Min: 97 % Max: 99 %  No Data Recorded     Intake and Output:  Current Shift:  In: 480 [Oral:480]  Out: 575 [Urine:575]   Intake and Output: Last  Two Completed Shifts:  I/O Last 2 Completed Shifts:  In: 2207.3 [Oral:910; Crystalloid:1017.3; Colloid:280]  Out: 1470 [Urine:1470]    PHYSICAL EXAM:    GENERAL APPEARANCE: Alert & Oriented, lying in bed   EYES:  No conjunctival pallor or scleral icterus. Extraocular movements normal.   MOUTH:  Oral mucosa moist. No oral thrush or ulcers.   CARDIOVASCULAR:  No edema of lower extremities. Jugular venous pressure not elevated. Normal heart sounds. No murmurs.  RESPIRATORY:  Mild crackles at the bases   GI:  Abdomen distended, decrease bowel sounds heard.   GU: Left lower quadrant surgical incision clean & dry. Mild tenderness over renal allograft.  MUSCULOSKELETAL: No joint swelling or deformities.  NERVOUS SYSTEM: No motor or sensory deficits. No tremor but + chills   PSYCH:  Apropriate affect.  Foley catheter has hematuric urine    LAB TESTS/STUDIES:  BLOOD COUNTS   Recent labs for the past 48 hours     12/24/12 1030 12/23/12 0500 12/22/12 2100    WHITE BLOOD CELL COUNT 3.9* 6.3 5.9    RED CELL COUNT 2.47* 2.58* 2.65*    HEMOGLOBIN 8.2* 8.3* 8.6*    HEMATOCRIT 23.4* 24.8* 25.2*    MCV 94.6 96.1 95.2    MCH 33.1* 32.0 32.5    RDW 13.1 12.9 13.2    PLATELET COUNT 86* 92* 101*    NUCLEATED RBC/100 WBC -- -- --    BC COMMENTS -- -- --      CHEM 20   Recent labs for the past 48 hours     12/24/12 0445 12/23/12 0500 12/22/12 2100    SODIUM 132* 132* 135    POTASSIUM 4.3 4.4 4.5    CHLORIDE 101 102 103    CARBON DIOXIDE TOTAL 22* 23* 25    CREATININE BLOOD 8.27* 6.31* 5.40*    UREA NITROGEN, BLOOD (BUN) 50* 32* 24*    GLUCOSE 206* 152* 117*    CALCIUM 7.1* 7.2* 7.7*    PROTEIN 5.8* 5.0* --    ALBUMIN 2.7* 2.6* --    ALKALINE PHOSPHATASE (ALP) 52 50 --    ASPARTATE TRANSAMINASE (AST) 26 36 --    BILIRUBIN TOTAL 0.5 0.4 --    ALANINE TRANSFERASE (ALT) 18 28 --    TRIGLYCERIDE -- -- --    URIC ACID, BLOOD -- -- --    LACTATE DEHYDROGENASE -- -- --    PHOSPHORUS (PO4) 5.6* 4.5 --    CHOLESTEROL -- -- --     MAGNESIUM (MG) -- -- 1.7        ASSESSMENT AND RECOMMENDATIONS:    1. Deceased donor renal transplant recipient, post-operative day # 2  Has delayed  renal allograft function required dialysis on 12/22/12 post transplant given hyperkalemia.  No indications for dialysis from clinical or biochemical perspectives today, however anticipate may still need dialysis if no improvement in allograft function.    2. Immunosuppression  Non-sensitized   Will receive methylprednisolone and should get 3/5 doses of thymoglobulin today given pediatric en-bloc transplant  Receiving MMF.Tacrolimus started today.    3. Hypertension  Blood pressure levels acceptable with current dose of metoprolol. Can be increased if desired.    4. Diabetes mellitus  Continue blood sugar monitoring.  Sugars been 170-200 and on sliding scale.    5. Antimicrobial prophylaxis  Intermediate risk for CMV disease - receiving iv gancyclovir. Will be switched to oral valgancyclovir once antibody induction has been completed.  Will be started on trimethoprim/sulfamethoxyzole on post-operative day #4 for PJP and urinary infection prophylaxis.    Dialysis chair to be held - i will talk to outpatient co-ordinator.    Surgery team to decide if repeat ultrasound is required to ensure patency of arterial and venous system of peds en bloc kidneys.    Electronically signed by:    Freeman Caldron   Renal Fellow  Pager: [phone]  PI: [id number]     TEACHING PHYSICIAN ATTESTATION  Patient seen and examined by me with the renal fellow. The history, physical exam and laboratory data were reviewed and confirmed by me. We developed a care plan together as outlined in the fellow's note above.    Electronically signed by:  Michaele Offer, MD  Attending  [phone]  Department of Nephrology    12/24/2012 12:43

## 2012-12-24 NOTE — Allied Health Progress (Signed)
NUTRITION SERVICES: EDUCATION    Note Started:   12/24/2012, 14:16   Date of Service:  12/24/2012      47 yo male s/p DDRT(en-bloc peds donor) on 12/22/12 with delayed graft function    Provided written and verbal education on post-transplant nutrition to patient and wife Burman Foster.  Discussed fluid allowances, adequate protein to optimize post-op healing, potassium restriction, sodium restriction, nutrient-medication interactions, and basic food safety guidelines. Also discussed carbohydrate controlled diet and diabetes self management.  Patient and wife verbalized good understanding and no barriers to learning were observed. Please refer to MPER.     Handouts Provided:  - Nutrition after Kidney Transplant  - Water Safety Guidelines (Spanish)  - Producer, television/film/video for Transplant Recipients Architect) Spanish handout    Patient to follow up with outpatient Transplant Dietitian in Transplant clinic after discharge.    Report Electronically Signed By: Zeb Comfort, MS, RD, CDE pager [phone]

## 2012-12-24 NOTE — Allied Health Progress (Signed)
CLINICAL CASE MANAGEMENT  ASSESSMENT NOTE    Name: Charles Galvan  MRN: [id number]   Date of Birth:May 12, 1966 (55yr) Gender:male    Note Date: 12/24/2012 Note Time: 08:27   Date of Service: same Time of Service: same     Patient able to participate in plan?   yes  Contact Person/Caregiver if unable to participate  :   Wife who is primary Spanish speaking, adult children available, wife able to transport pt  Lives with:   Family  Family/Friends to assist: yes  Funding: Medicare A&B/MCAL  Primary Care Physician Identified / Phone Number:   Transplant Clinic [phone]  Preferred Pharmacy:  [facility] pharmacy and local pharmacy  Permanent Address:  [address]  [place] [address]  Discharge Address:  same  Pre-existing Lines/Drains/Wounds: left upper arm AV fistula  Pre-Hospitalization Level of Care:   Home with family  Current Functional Status:   ambulatory  Home Health and/or Resources in Place:   May hold dialysis chair  DME in Place: none  Potential Barriers to Discharge: npne  Anticipated DC Needs:   Denies needs currently   Patient/Family offered choice of provider and agreeable with plan/Referrals? yes  Patient/family provided with long-term care community resources:  yes/ information given by Transplant Team  Comments:   Pt lives with wife,  Daughter an son and young grandchildren.  Wife primary cg , family very supportive.  Plan/Follow-up needed:   CM to follow for any discharge needs, clarify local pharmacy        Electronic signed by Theodoro Parma  RN CCM  Clinical Case Management  [phone], Pg [phone]

## 2012-12-24 NOTE — Plan of Care (Signed)
Problem: Sleep Pattern Disturbance (Adult, Obstetrics)  Goal: Adequate Sleep/Rest (Sleep Pattern Disturbance)  Patient will demonstrate the desired outcomes.   Outcome: Progressing  Care clustered.

## 2012-12-25 ENCOUNTER — Other Ambulatory Visit: Payer: Self-pay

## 2012-12-25 MED ORDER — HYDROCODONE 5 MG-ACETAMINOPHEN 325 MG TABLET
1.0000 | ORAL_TABLET | ORAL | Status: DC | PRN
Start: 2012-12-25 — End: 2012-12-29
  Administered 2012-12-25 – 2012-12-28 (×5): 2 via ORAL
  Administered 2012-12-29: 1 via ORAL
  Administered 2012-12-29: 2 via ORAL
  Filled 2012-12-25: qty 1
  Filled 2012-12-25: qty 2
  Filled 2012-12-25: qty 1
  Filled 2012-12-25 (×5): qty 2

## 2012-12-25 MED ORDER — INSULIN ASPART (U-100) 100 UNIT/ML (3 ML) SUBCUTANEOUS PEN
PEN_INJECTOR | SUBCUTANEOUS | 5 refills | Status: DC
Start: 2012-12-25 — End: 2012-12-31
  Filled 2012-12-25: qty 15, 28d supply, fill #0

## 2012-12-25 MED ORDER — FUROSEMIDE 10 MG/ML INJECTION SOLUTION
80.0000 mg | Freq: Four times a day (QID) | INTRAMUSCULAR | Status: AC
Start: 2012-12-25 — End: 2012-12-25
  Administered 2012-12-25 (×2): 80 mg via INTRAVENOUS
  Filled 2012-12-25 (×2): qty 8

## 2012-12-25 MED ORDER — LANCETS
5 refills | Status: DC
Start: 2012-12-25 — End: 2013-01-20
  Filled 2012-12-25: qty 100, fill #0
  Filled 2012-12-26: qty 100, 25d supply, fill #0

## 2012-12-25 MED ORDER — SULFAMETHOXAZOLE 400 MG-TRIMETHOPRIM 80 MG TABLET
1.0000 | ORAL_TABLET | Freq: Every day | ORAL | 0 refills | Status: AC
Start: 2012-12-25 — End: 2013-03-25
  Filled 2012-12-25: qty 30, 30d supply, fill #0
  Filled 2013-01-13 – 2013-01-20 (×2): qty 30, 30d supply, fill #1
  Filled 2013-02-13: qty 30, 30d supply, fill #2

## 2012-12-25 MED ORDER — MYCOPHENOLATE MOFETIL 250 MG CAPSULE
750.0000 mg | ORAL_CAPSULE | Freq: Two times a day (BID) | ORAL | 6 refills | Status: DC
Start: 2012-12-25 — End: 2013-01-06
  Filled 2012-12-25 – 2012-12-26 (×2): qty 180, 30d supply, fill #0

## 2012-12-25 MED ORDER — TACROLIMUS 1 MG CAPSULE, IMMEDIATE-RELEASE
6.0000 mg | ORAL_CAPSULE | Freq: Two times a day (BID) | ORAL | 6 refills | Status: DC
Start: 2012-12-25 — End: 2013-01-06
  Filled 2012-12-25 – 2012-12-26 (×2): qty 360, 30d supply, fill #0

## 2012-12-25 MED ORDER — BLOOD-GLUCOSE METER KIT
1.0000 | PACK | Freq: Once | Status: DC
Start: 2012-12-25 — End: 2012-12-25

## 2012-12-25 MED ORDER — INSULIN SYRINGE U-100 WITH NEEDLE 1 ML 30 GAUGE X 1/2"
INJECTION | 1 refills | Status: AC
Start: 2012-12-25 — End: 2013-12-25
  Filled 2012-12-25: qty 4, 28d supply, fill #0
  Filled 2013-02-13: qty 4, 28d supply, fill #1

## 2012-12-25 MED ORDER — HYDROCODONE 5 MG-ACETAMINOPHEN 325 MG TABLET
1.0000 | ORAL_TABLET | ORAL | 0 refills | Status: DC | PRN
Start: 2012-12-25 — End: 2013-01-13
  Filled 2012-12-25: qty 30, 5d supply, fill #0

## 2012-12-25 MED ORDER — BLOOD-GLUCOSE METER KIT
1.0000 | PACK | Freq: Once | Status: AC
Start: 2012-12-25 — End: 2012-12-25
  Administered 2012-12-25: 1
  Filled 2012-12-25: qty 1

## 2012-12-25 MED ORDER — EPOETIN ALFA 10,000 UNIT/ML INJECTION SOLUTION
10000.0000 [IU] | INTRAMUSCULAR | 1 refills | Status: DC
Start: 2012-12-25 — End: 2013-01-13
  Filled 2012-12-25: qty 4, 28d supply, fill #0

## 2012-12-25 MED ORDER — MULTIVITAMIN TABLET
1.0000 | ORAL_TABLET | Freq: Every day | ORAL | 3 refills | Status: DC
Start: 2012-12-25 — End: 2014-04-12
  Filled 2012-12-25: qty 100, 100d supply, fill #0
  Filled 2013-03-17: qty 100, 100d supply, fill #1

## 2012-12-25 MED ORDER — EPOETIN ALFA 10,000 UNIT/ML INJECTION SOLUTION
10000.0000 [IU] | INTRAMUSCULAR | Status: DC
Start: 2012-12-25 — End: 2012-12-31
  Administered 2012-12-25: 10000 [IU] via SUBCUTANEOUS
  Filled 2012-12-25 (×2): qty 1

## 2012-12-25 MED ORDER — CHOLECALCIFEROL (VITAMIN D3) 25 MCG (1,000 UNIT) TABLET
1000.0000 [IU] | ORAL_TABLET | Freq: Every day | ORAL | 3 refills | Status: DC
Start: 2012-12-25 — End: 2012-12-25
  Filled 2012-12-25: qty 100, 100d supply, fill #0

## 2012-12-25 MED ORDER — ASPIRIN 81 MG TABLET,DELAYED RELEASE
81.0000 mg | DELAYED_RELEASE_TABLET | Freq: Every day | ORAL | 3 refills | Status: DC
Start: 2012-12-25 — End: 2013-10-26
  Filled 2012-12-25: qty 100, 100d supply, fill #0
  Filled 2013-03-17: qty 100, 100d supply, fill #1
  Filled 2013-07-08: qty 100, 100d supply, fill #2
  Filled 2013-09-29: qty 100, 100d supply, fill #3

## 2012-12-25 MED ORDER — BLOOD SUGAR DIAGNOSTIC STRIPS
ORAL_STRIP | 5 refills | Status: DC
Start: 2012-12-25 — End: 2013-01-20
  Filled 2012-12-25 – 2012-12-26 (×2): qty 100, 25d supply, fill #0
  Filled 2013-01-20: qty 100, 25d supply, fill #1

## 2012-12-25 MED ORDER — VALGANCICLOVIR 450 MG TABLET
450.0000 mg | ORAL_TABLET | Freq: Every day | ORAL | 2 refills | Status: DC
Start: 2012-12-25 — End: 2012-12-31
  Filled 2012-12-25: qty 30, 30d supply, fill #0

## 2012-12-25 MED ORDER — DEPRECATED NACL 0.9% 250ML CARRIER
125.0000 mg | Freq: Once | INTRAVENOUS | Status: AC
Start: 2012-12-25 — End: 2012-12-26
  Administered 2012-12-25: 125 mg via INTRAVENOUS
  Filled 2012-12-25: qty 125

## 2012-12-25 MED ORDER — DOCUSATE SODIUM 100 MG CAPSULE
100.0000 mg | ORAL_CAPSULE | Freq: Two times a day (BID) | ORAL | 2 refills | Status: DC
Start: 2012-12-25 — End: 2013-01-20
  Filled 2012-12-25: qty 100, 50d supply, fill #0

## 2012-12-25 MED ORDER — METOPROLOL TARTRATE 25 MG TABLET
25.0000 mg | ORAL_TABLET | Freq: Two times a day (BID) | ORAL | Status: DC
Start: 2012-12-25 — End: 2012-12-29
  Administered 2012-12-25 – 2012-12-29 (×9): 25 mg via ORAL
  Filled 2012-12-25 (×9): qty 1

## 2012-12-25 MED ORDER — PEN NEEDLE, DIABETIC 31 GAUGE X 3/16"
1.0000 | Freq: Four times a day (QID) | 5 refills | Status: DC | PRN
Start: 2012-12-25 — End: 2013-06-03
  Filled 2012-12-25: qty 100, 25d supply, fill #0
  Filled 2013-01-20: qty 100, 25d supply, fill #1
  Filled 2013-02-13: qty 100, 25d supply, fill #2
  Filled 2013-03-17: qty 100, 25d supply, fill #3
  Filled 2013-04-14: qty 100, 25d supply, fill #4
  Filled 2013-05-11: qty 100, 25d supply, fill #5

## 2012-12-25 MED ORDER — LIDOCAINE 2 % MUCOSAL JELLY IN APPLICATOR
1.0000 | Freq: Once | Status: AC
Start: 2012-12-25 — End: 2012-12-25
  Administered 2012-12-25: 1 via URETHRAL
  Filled 2012-12-25: qty 1

## 2012-12-25 MED ORDER — TACROLIMUS 5 MG CAPSULE, IMMEDIATE-RELEASE
5.0000 mg | ORAL_CAPSULE | Freq: Two times a day (BID) | ORAL | Status: DC
Start: 2012-12-25 — End: 2012-12-27
  Administered 2012-12-25 – 2012-12-27 (×4): 5 mg via ORAL
  Filled 2012-12-25 (×4): qty 1

## 2012-12-25 MED ORDER — PRAVASTATIN 20 MG TABLET
20.0000 mg | ORAL_TABLET | Freq: Every day | ORAL | 5 refills | Status: DC
Start: 2012-12-25 — End: 2013-06-03
  Filled 2012-12-25: qty 30, 30d supply, fill #0
  Filled 2013-01-13 – 2013-01-20 (×2): qty 30, 30d supply, fill #1
  Filled 2013-02-13: qty 30, 30d supply, fill #2
  Filled 2013-03-17: qty 30, 30d supply, fill #3
  Filled 2013-04-14: qty 30, 30d supply, fill #4
  Filled 2013-05-11: qty 30, 30d supply, fill #5

## 2012-12-25 MED ORDER — CHOLECALCIFEROL (VITAMIN D3) 25 MCG (1,000 UNIT) TABLET
1000.0000 [IU] | ORAL_TABLET | Freq: Every day | ORAL | 5 refills | Status: AC
Start: 2012-12-25 — End: 2013-12-25
  Filled 2012-12-25: qty 100, 100d supply, fill #0
  Filled 2013-03-17: qty 100, 100d supply, fill #1

## 2012-12-25 MED ORDER — FAMOTIDINE 20 MG TABLET
20.0000 mg | ORAL_TABLET | Freq: Two times a day (BID) | ORAL | 5 refills | Status: DC
Start: 2012-12-25 — End: 2013-04-12
  Filled 2012-12-25: qty 60, 30d supply, fill #0
  Filled 2013-01-13 – 2013-01-20 (×2): qty 60, 30d supply, fill #1
  Filled 2013-02-13: qty 60, 30d supply, fill #2
  Filled 2013-03-17: qty 60, 30d supply, fill #3

## 2012-12-25 MED ORDER — AMOXICILLIN 500 MG-POTASSIUM CLAVULANATE 125 MG TABLET
1.0000 | ORAL_TABLET | Freq: Two times a day (BID) | ORAL | 0 refills | Status: DC
Start: 2012-12-25 — End: 2012-12-31
  Filled 2012-12-25: qty 50, 25d supply, fill #0

## 2012-12-25 NOTE — Progress Notes (Addendum)
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 Afebrile this am    Vital Signs   Summary  Temp src:  [-]   Temp:  [36.5 C (97.7 F)-38.4 C (101.1 F)]   Pulse:  [84-101]   BP: (138-179)/(60-93)   Resp:  [18-20]   SpO2:  [92 %-100 %]   Current Vitals  Temp: 37.8 C (100 F) (12/26/12 0344)  BP: 162/76 mmHg (12/26/12 0344) Pulse: 89  (12/26/12 0344)  Resp: 18  (12/26/12 0344)  SpO2: 96 % (12/26/12 0344)      Weight: 80.1 kg (176 lb 9.4 oz) (12/26/12 0400)     Today's Weight: Weight: 80.1 kg (176 lb 9.4 oz) (12/26/12 0400)   Admission Weight: 75.2 kg    I&O:   Intake/Output Summary (Last 24 hours) at 12/26/12 0616  Last data filed at 12/26/12 0400   Gross per 24 hour   Intake   2040 ml   Output   1400 ml   Net    640 ml     PHYSICAL EXAM  General: awake, alert, NAD  Eyes: non-icteric  Resp: breathing comfortably   CV: regular rate, regular rhythm  GI: soft, non-distended, wound intact, no erythema  GU: hematuria unchanged  Neuro: Alert and oriented x 3  Ext: Palpable distal pulses bilaterally, no edema    Labs:  BASIC METABOLIC PANEL Recent labs for the past 72 hours     12/26/12 0500 12/25/12 0500 12/24/12 0445    SODIUM 136 134* 132*    POTASSIUM 3.9 4.4 4.3    CHLORIDE 105 102 101    CARBON DIOXIDE TOTAL 18* 19* 22*    UREA NITROGEN, BLOOD (BUN) 78* 66* 50*    CREATININE BLOOD 11.10* 9.91* 8.27*    GLUCOSE 122* 211* 206*    CALCIUM 7.2* 7.1* 7.1*    MAGNESIUM (MG) -- -- --    PHOSPHORUS (PO4) 6.3* 6.3* 5.6*    ALBUMIN 2.8* 2.8* 2.7*      BLOOD COUNTS Recent labs for the past 72 hours     12/26/12 0502 12/25/12 0500  12/24/12 1030    WHITE BLOOD CELL COUNT 2.6* 2.1* 3.9*    HEMOGLOBIN 9.2* 7.3* 8.2*    HEMATOCRIT 26.3* 21.4* 23.4*    PLATELET COUNT 95* 78* 86*      PT   Recent labs for the past 72 hours     12/24/12 0445    PROTHROMBIN TIME --    INR --    APTT 23.5*      LFTs Recent labs for the past 72 hours     12/26/12 0500 12/25/12 0500 12/24/12 0445    ASPARTATE TRANSAMINASE (AST) 21 20 26     ALANINE TRANSFERASE (ALT) 13 13 18     ALKALINE PHOSPHATASE (ALP) 50 53 52    ALBUMIN 2.8* 2.8* 2.7*    PROTEIN 5.9* 5.8* 5.8*    BILIRUBIN TOTAL 0.6 0.4 0.5    BILIRUBIN DIRECT <0.1 <0.1 <0.1  POC Glucose: POC Glucose, blood:  [121 mg/dl-219 mg/dl]     Lab Results   Lab Name Value Date/Time    TACROLIMUS <2.0* 12/25/2012  5:00 AM     Blood culture, UA pending    Imaging From the Last 24 Hours:   None      ASSESSMENT/PLAN:  20yr year old male s/p Peds-en-bloc Deceased Donor Renal Transplant    Graft: Delayed Graft Function.    -Hemodialysis necessary for hyperkalemia after transplantation on POD 1  -Reassess need for dialysis daily  -Continue to monitor    Immunosuppression:    Low Immunologic Risk: PRA 0%;   -Thymoglobulin 1.5 mg/kg for 5 total doses    -Thymo 125 mg IV, dose 5/5 today.   -Solumedrol 75 mg IV 3/3 doses, then no more steroids  -Cellcept 500 mg PO BID  -Prograf POD2    Prophylaxis:    Low Risk CMV (CMV Donor negative, CMV Recipient positive)  -Ganciclovir 1.25 mg/kg IV q 24 hours  -Acyclovir after Thymo complete  -Bactrim SS one PO Daily for PJP prophylaxis on POD 4  -Ampicillin for donor UTI    F/E/N: Hypervolemic.  -SLIV  -Renal, diabetic diet    CV: Hypertension better controlled.  -Continue Metoprolol to 25 mg IBD  -Continue ASA, and Beta-blocker.    Pulm: No current pulmonary issues.  Continue IS, OOB.    GU: Foley Catheter in place.  -plan for Foley removal on POD 4/day of discharge    Endocrine: Diabetes Mellitus Type II, POC Glucose, blood:  [121 mg/dl-219 mg/dl] , better controlled.    -ISS    Neuro:  Post-op pain well controlled.    -Norco, Dilaudid IV for breakthrough pain    Heme: CBC with appropriate rise after transfusion yesterday.  Febrile to 38.4  -Follow-up blood cultures  -Continue to monitor    Lines: L IJ    Wound: LLQ incision, no drain    Dispo: Ward care    Complications:   None    Electronically Signed by:  Billy Coast, MD  General Surgery, PGY1  Transplant Service Pager: [phone]    This patient was seen, evaluated, and a care plan was developed with the resident. I agree with the findings and plan as outlined in Dr Cordelia Pen note above.    Vernice Jefferson, MD  ATTENDING TRANSPLANT SURGEON  PI [phone]

## 2012-12-25 NOTE — Nurse Assessment (Signed)
ASSESSMENT NOTE    Note Started: 12/25/2012, 10:09     Initial assessment completed at 0800 and recorded in EMR.  Report received from night shift nurse and orders reviewed. Plan of Care reviewed and appropriate, discussed with patient.  S/P peds-en-bloc DDRT POD #3 delayed graft function BUN 66, Cr 9.91, K 4.4, H/H 7.3/21.4 (Epogen ordered), WBC 2.1. Patient denies CP or SOB and denies other discomfort. VSS. Will continue to review transplant teaching and monitor patient's progress. [name], RN RN

## 2012-12-25 NOTE — Nurse Focus (Signed)
1330-Patient reported penile burning and pain. Increased edema was noted, no drainage noted. Dr. Charlie Pitter was notified. Will administer Lidocaine (urojet) 2% as ordered. Will continue to monitor. [name], RN.

## 2012-12-25 NOTE — Plan of Care (Signed)
Problem: Kidney Transplant (Adult)  Goal: Prevent/Manage Potential Problems (Kidney Transplant (Adult))  Outcome: Signs and symptoms of listed potential problems will be absent or manageable (reference CPG)   Outcome: Progressing  Delayed graft function

## 2012-12-25 NOTE — Progress Notes (Addendum)
Transplant Surgery Daily Progress Note        Date of Service: 12/25/2012        ID: 47yr old male with ESRD due to diabetic nephropathy s/p Peds-en-bloc DDRT    POD: 3 (reperfusion time 0052 12/22/12)    24 Hour Events:  BP still elevated (160-170)  H/H dropped (Hb 7.3 this morning)- asymptomatic  No indications for dialysis yesterday  Cr rising, K ok (4.4)    Vital Signs   Summary  Temp src:  [-]   Temp:  [36.6 C (97.9 F)-37.2 C (99 F)]   Pulse:  [75-96]   BP: (134-168)/(61-85)   Resp:  [16-20]   SpO2:  [97 %-99 %]   Current Vitals  Temp: 36.6 C (97.9 F) (12/25/12 0403)  BP: 165/85 mmHg (12/25/12 0403) Pulse: 83  (12/25/12 0403)  Resp: 20  (12/25/12 0403)  SpO2: 97 % (12/25/12 0403)      Weight: 79.2 kg (174 lb 9.7 oz) (12/25/12 0518)     Today's Weight: Weight: 79.2 kg (174 lb 9.7 oz) (12/25/12 0518)   Admission Weight: 75.2 kg    I&O:   Intake/Output Summary (Last 24 hours) at 12/25/12 0608  Last data filed at 12/25/12 0403   Gross per 24 hour   Intake 1463.75 ml   Output   1425 ml   Net  38.75 ml       PHYSICAL EXAM  General: awake, alert, NAD  Eyes: non-icteric  Resp: breathing comfortably   CV: regular rate, regular rhythm  GI: soft, non-distended, wound intact, no erythema  GU: hematuria (cranberry juice)  Neuro: Alert and oriented x 3  Ext: Palpable distal pulses bilaterally, no edema    Labs:  BASIC METABOLIC PANEL Recent labs for the past 72 hours     12/25/12 0500 12/24/12 0445 12/23/12 0500 12/22/12 2100 12/22/12 1128    SODIUM 134* 132* 132* 135 131*    POTASSIUM 4.4 4.3 4.4 4.5 7.5*    CHLORIDE 102 101 102 103 104    CARBON DIOXIDE TOTAL 19* 22* 23* 25 16*    UREA NITROGEN, BLOOD (BUN) 66* 50* 32* 24* 46*    CREATININE BLOOD 9.91* 8.27* 6.31* 5.40* 7.69*    GLUCOSE 211* 206* 152* 117* 284*    CALCIUM 7.1* 7.1* 7.2* 7.7* 7.5*    MAGNESIUM (MG) -- -- -- 1.7 1.8    PHOSPHORUS (PO4) 6.3* 5.6* 4.5 -- --    ALBUMIN 2.8* 2.7* 2.6* -- --      BLOOD COUNTS Recent labs for the past 72 hours     12/25/12  0500 12/24/12 1030 12/23/12 0500 12/22/12 2100 12/22/12 1128    WHITE BLOOD CELL COUNT 2.1* 3.9* 6.3 5.9 8.1    HEMOGLOBIN 7.3* 8.2* 8.3* 8.6* 9.9*    HEMATOCRIT 21.4* 23.4* 24.8* 25.2* 29.5*    PLATELET COUNT 78* 86* 92* 101* 113*      PT   Recent labs for the past 72 hours     12/24/12 0445 12/23/12 0500    PROTHROMBIN TIME -- --    INR -- --    APTT 23.5* 24.6      LFTs Recent labs for the past 72 hours     12/25/12 0500 12/24/12 0445 12/23/12 0500    ASPARTATE TRANSAMINASE (AST) 20 26 36    ALANINE TRANSFERASE (ALT) 13 18 28     ALKALINE PHOSPHATASE (ALP) 53 52 50    ALBUMIN 2.8* 2.7* 2.6*    PROTEIN 5.8*  5.8* 5.0*    BILIRUBIN TOTAL 0.4 0.5 0.4    BILIRUBIN DIRECT <0.1 <0.1 0.1       POC Glucose: POC Glucose, blood:  [177 mg/dl-290 mg/dl]     Imaging From the Last 24 Hours:   None      ASSESSMENT/PLAN:  28yr year old male s/p Peds-en-bloc Deceased Donor Renal Transplant    Graft: Delayed Graft Function.    -Hemodialysis necessary for hyperkalemia after transplantation on POD 1  -Reassess need for dialysis daily  -Continue to monitor    Immunosuppression:    Low Immunologic Risk: PRA 0%;   -Thymoglobulin 1.5 mg/kg for 5 total doses    -Thymo 125 mg IV, dose 4/5 today.   -Solumedrol 75 mg IV today 3/3 doses, then no more steroids  -Cellcept 500 mg PO BID  -Prograf POD2    Prophylaxis:    Low Risk CMV (CMV Donor negative, CMV Recipient positive)  -Ganciclovir 1.25 mg/kg IV q 24 hours  -Acyclovir after Thymo complete  -Bactrim SS one PO Daily for PJP prophylaxis on POD 4  -Ampicillin for donor UTI    F/E/N: Euvolemic.  -SLIV  -Renal diet    CV: Hypertension poorly controlled.  -Increase Metoprolol to 25 mg IBD  -Continue ASA, and Beta-blocker.  - D/c tele    Pulm: No current pulmonary issues.  Continue IS, OOB.    GU: Foley Catheter in place.  -plan for Foley removal on POD 4/day of discharge    Endocrine: Diabetes Mellitus Type II, POC Glucose, blood:  [177 mg/dl-290 mg/dl] , poorly-controlled.    -ISS    Neuro:  Post-op pain well controlled.    -Dilaudid IV for breakthrough pain    Heme: CBC decreased; asymptomatic. Will discuss transfusion with team    Lines: L IJ    Wound: LLQ incision, no drain    Dispo: Ward care    Complications:   None    Webb Silversmith, MD PGY 5  PI [id number] Pager [phone]  Transplant Surgery x [phone]    This patient was seen, evaluated, and a care plan was developed with the resident. I agree with the findings and plan as outlined in Dr Ulyess Blossom note above.    Vernice Jefferson, MD  ATTENDING TRANSPLANT SURGEON  PI [phone]

## 2012-12-25 NOTE — Progress Notes (Addendum)
TRANSPLANT NEPHROLOGY PROGRESS NOTE  Name: Charles Galvan MRN: [id number]    Note Date and Time: 12/25/2012   Date of Admission: 12/21/2012  7:06 AM    Date of Service: 12/25/2012  Patient's PCP: No Pcp No Pcp      SUMMARY:    [name] is a 47yr old gentleman with history of stage V chronic kidney disease of unknown etiology but possibly attributed to diabetes who received a deceased donor renal transplant  (en-bloc kidneys from a pediatric donor, 43 months male with terminal creatinine of 2.6) on 12/22/12.  He has 0 % PRA and intermediate risk for CMV.  He did require one round of dialysis before and after his transplant surgery.    History antedating transplant is also remarkable for hypertension, diabetes mellitus, HTN, HLD, depression.    INTERVAL HISTORY:   No dialysis needs overnight .  Still making around 1.4 L urine without any lasix   BP slightly higher trend earlier today     SUBJECTIVE/REVIEW OF SYSTEMS:   Denies fever   Ambulated earlier today  Tolerating solid diet.  No shortness of breath, cough or expectoration.  Denies chest pain palpitations or light headedness.  No nausea or vomiting. Has passed flatus and BM  Has an indwelling bladder catheter with hematuria  No itching or skin rash.  No joint pain or swelling.  Denies headache, tremor, weakness or numbness.  Mood normal.  Incisional pain has improved    Medications  Scheduled Medications     Acetaminophen (TYLENOL) Tablet 650 mg, ORAL, PRE-MED  Ampicillin IV 1 g, IV, Q12H Now, Last Rate: 1 g (12/25/12 0511)  Aspirin Chewable Tablet 81 mg, ORAL, QAM  Cholecalciferol (VITAMIN D3) Tablet/Capsule 1,000 Units, NG, QAM  DiphenhydrAMINE (BENADRYL) Injection 25 mg, IV, PRE-MED  Docusate (COLACE) Capsule 100 mg, ORAL, BID  Epoetin (EPOGEN, PROCRIT) Injection 10,000 Units, SUBCUTANEOUS, Thu  FamoTIDine (PEPCID) 20 mg in NaCl 0.9% 50 mL IVPB, IV, PRE-MED, Last Rate: 20 mg (12/23/12 1012)  FamoTIDine (PEPCID) Tablet 20 mg, ORAL, BID  Ganciclovir (CYTOVENE) 95  mg in NaCl 0.9% 100 mL IVPB, IV, Daily 2100, Last Rate: 95 mg (12/24/12 2112)  Insulin Aspart (NOVOLOG) Injection Pen 1-12 Units, SUBCUTANEOUS, TID w/ meals - correction dose  Insulin Aspart (NOVOLOG) Injection Pen 1-4 Units, SUBCUTANEOUS, Daily Bedtime and 0300  Metoprolol Tartrate (LOPRESSOR) Tablet 25 mg, ORAL, BID  Multivitamin (HEXAVITAMIN) Tablet 1 tablet, ORAL, QAM  Mycophenolate (MMF) (CELLCEPT) Capsule 500 mg, ORAL, BID  Polyethylene Glycol 3350 (MIRALAX) Oral Powder Packet 17 g, ORAL, QAM  Pravastatin (PRAVACHOL) Tablet 20 mg, ORAL, QAM  Sennosides (SENOKOT) Tablet 17.2 mg, ORAL, Daily Bedtime  Tacrolimus (FK-506) (PROGRAF) Capsule 4 mg, ORAL, BID (06,18)  Trimethoprim 80 mg/Sulfamethoxazole 400 mg (BACTRIM SS) Tablet 1 tablet, ORAL, QAM    IV Medications   PRN Medications     Dextrose 50% Injection 12.5 g, IV, PRN  Dextrose 50% Injection 25 g, IV, PRN  DiphenhydrAMINE (BENADRYL) Capsule 25 mg, ORAL, Q6H PRN  Glucagon Injection 1 mg, IM, PRN  Heparin (PF) 100 units/mL Flush Syringe 3 mL, Intercatheter, PRN  Hydrocodone 5 mg/Acetaminophen 325 mg (NORCO  5) Tablet 1 tablet, ORAL, Q4H PRN  Ondansetron (ZOFRAN) Injection 4 mg, IV, Q12H PRN        Allergies  No Known Allergies     Vital Signs Summary  Temp Min: 36.6 C (97.9 F) Max: 37.2 C (99 F)   Systolic (24hrs), Avg:160 mmHg, Min:119 mmHg, Max:213 mmHg  Diastolic (24hrs), Avg:76 mmHg, Min:51 mmHg, Max:106 mmHg    Pulse Min: 75  Max: 96   Temp: 37.1 C (98.8 F)  BP: 172/93 mmHg  Pulse: 90   Resp: 20   SpO2: 100 %  Flow (L/Min)(Oxygen Therapy): 2   Oxygen Concentration (%)(Therapy): (not recorded)  Weight: 79.2 kg (174 lb 9.7 oz)       Resp Min: 16  Max: 20   SpO2 Min: 97 % Max: 100 %  No Data Recorded     Intake and Output:  Current Shift:  In: 360 [Oral:360]  Out: 300 [Urine:300]   Intake and Output: Last Two Completed Shifts:  I/O Last 2 Completed Shifts:  In: 1463.8 [Oral:1320; Crystalloid:100; Colloid:43.8]  Out: 1425  [Urine:1425]    PHYSICAL EXAM:    GENERAL APPEARANCE: Alert & Oriented, sitting in a chair   EYES:  No conjunctival pallor or scleral icterus. Extraocular movements normal.   MOUTH:  Oral mucosa moist. No oral thrush or ulcers.   CARDIOVASCULAR:  No edema of lower extremities. Jugular venous pressure not elevated. Normal heart sounds. No murmurs.  RESPIRATORY: clear   GI:  Abdomen mildly distended, decrease bowel sounds heard.   GU: Left lower quadrant surgical incision clean & dry. Mild tenderness over renal allograft.  MUSCULOSKELETAL: No joint swelling or deformities.  NERVOUS SYSTEM: No motor or sensory deficits. No tremors today   PSYCH:  Apropriate affect.  Foley catheter has hematuric urine    LAB TESTS/STUDIES:  BLOOD COUNTS   Recent labs for the past 48 hours     12/25/12 0500 12/24/12 1030    WHITE BLOOD CELL COUNT 2.1* 3.9*    RED CELL COUNT 2.26* 2.47*    HEMOGLOBIN 7.3* 8.2*    HEMATOCRIT 21.4* 23.4*    MCV 94.4 94.6    MCH 32.4 33.1*    RDW 12.6 13.1    PLATELET COUNT 78* 86*    NUCLEATED RBC/100 WBC -- --    BC COMMENTS -- --      CHEM 20   Recent labs for the past 48 hours     12/25/12 0500 12/24/12 0445    SODIUM 134* 132*    POTASSIUM 4.4 4.3    CHLORIDE 102 101    CARBON DIOXIDE TOTAL 19* 22*    CREATININE BLOOD 9.91* 8.27*    UREA NITROGEN, BLOOD (BUN) 66* 50*    GLUCOSE 211* 206*    CALCIUM 7.1* 7.1*    PROTEIN 5.8* 5.8*    ALBUMIN 2.8* 2.7*    ALKALINE PHOSPHATASE (ALP) 53 52    ASPARTATE TRANSAMINASE (AST) 20 26    BILIRUBIN TOTAL 0.4 0.5    ALANINE TRANSFERASE (ALT) 13 18    TRIGLYCERIDE -- --    URIC ACID, BLOOD -- --    LACTATE DEHYDROGENASE -- --    PHOSPHORUS (PO4) 6.3* 5.6*    CHOLESTEROL -- --    MAGNESIUM (MG) -- --        ASSESSMENT AND RECOMMENDATIONS:    1. Deceased donor renal transplant recipient, post-operative day # 3  Has delayed renal allograft function required dialysis on 12/22/12 post transplant given hyperkalemia.  No indications for dialysis from  clinical or biochemical perspectives today  Dialysis chair reserved, will update his nephrologist before his discharge regarding dialysis needs.    2. Immunosuppression  Non-sensitized   Will receive methylprednisolone and should get 4/5 doses of thymoglobulin today given pediatric en-bloc transplant (consider dose reduction of his dose today  given low WBC count and platelets)   Receiving MMF.Tacrolimus started.    3. Hypertension  Blood pressure levels slightly higher, consider increasing metoprolol to 50 mg BID     4. Diabetes mellitus  Continue blood sugar monitoring.  Sugars been 180-200 and on sliding scale.  Consider restarting his oral hypoglycemics.    5. Antimicrobial prophylaxis  Intermediate risk for CMV disease - receiving iv gancyclovir. Will be switched to oral valgancyclovir once antibody induction has been completed.  Will be started on trimethoprim/sulfamethoxyzole on post-operative day #4 for PJP and urinary infection prophylaxis.    6. Anemia  Has blood in the urine.  Would consider transfusion if his repeat Hgb later today is still dropping.    Electronically signed by:  Freeman Caldron   Renal Fellow  Pager: [phone]  PI: [id number]     TEACHING PHYSICIAN ATTESTATION  Patient seen and examined by me with the renal fellow. The history, physical exam and laboratory data were reviewed and confirmed by me.   We developed a care plan together as outlined in the fellow's note above.    Electronically signed by:  Michaele Offer, MD  Attending  [phone]  Department of Nephrology    12/25/2012 12:41

## 2012-12-26 ENCOUNTER — Other Ambulatory Visit: Payer: Self-pay

## 2012-12-26 MED ORDER — CEFEPIME 1 GRAM SOLUTION FOR INJECTION
1.0000 g | Freq: Two times a day (BID) | INTRAMUSCULAR | Status: DC
Start: 2012-12-26 — End: 2012-12-29
  Administered 2012-12-26 – 2012-12-28 (×6): 1 g via INTRAVENOUS
  Filled 2012-12-26 (×6): qty 1000

## 2012-12-26 MED ORDER — ACETAMINOPHEN 325 MG TABLET
650.0000 mg | ORAL_TABLET | Freq: Four times a day (QID) | ORAL | Status: DC | PRN
Start: 2012-12-26 — End: 2012-12-31
  Administered 2012-12-27: 650 mg via ORAL
  Filled 2012-12-26: qty 2

## 2012-12-26 MED ORDER — NACL 0.9 % DIALYSIS CIRCUIT FLUSH
100.0000 mL | INTRAVENOUS | Status: AC
Start: 2012-12-26 — End: 2012-12-27
  Administered 2012-12-26: 950 mL via INTRAVENOUS

## 2012-12-26 MED ORDER — ONDANSETRON HCL (PF) 4 MG/2 ML INJECTION SOLUTION
4.0000 mg | Freq: Once | INTRAMUSCULAR | Status: AC
Start: 2012-12-26 — End: 2012-12-26
  Administered 2012-12-26: 4 mg via INTRAVENOUS
  Filled 2012-12-26: qty 2

## 2012-12-26 MED ORDER — METOPROLOL TARTRATE 25 MG TABLET
12.5000 mg | ORAL_TABLET | Freq: Two times a day (BID) | ORAL | Status: DC | PRN
Start: 2012-12-26 — End: 2012-12-27
  Filled 2012-12-26: qty 1

## 2012-12-26 MED ORDER — ACETAMINOPHEN 325 MG TABLET
650.0000 mg | ORAL_TABLET | Freq: Once | ORAL | Status: DC
Start: 2012-12-26 — End: 2012-12-27
  Filled 2012-12-26: qty 2

## 2012-12-26 MED ORDER — DEPRECATED NACL 0.9% 250ML CARRIER
75.0000 mg | Freq: Once | INTRAVENOUS | Status: DC
Start: 2012-12-26 — End: 2012-12-26

## 2012-12-26 MED ORDER — ONDANSETRON HCL (PF) 4 MG/2 ML INJECTION SOLUTION
8.0000 mg | Freq: Three times a day (TID) | INTRAMUSCULAR | Status: DC | PRN
Start: 2012-12-26 — End: 2012-12-31
  Administered 2012-12-27 – 2012-12-29 (×5): 8 mg via INTRAVENOUS
  Filled 2012-12-26 (×5): qty 4

## 2012-12-26 MED ORDER — NACL 0.9% IV BOLUS - DURATION REQ
100.0000 mL | INTRAVENOUS | Status: AC | PRN
Start: 2012-12-26 — End: 2012-12-27

## 2012-12-26 MED ORDER — ALBUMIN, HUMAN 25 % INTRAVENOUS SOLUTION
50.0000 mL | INTRAVENOUS | Status: AC
Start: 2012-12-26 — End: 2012-12-26
  Administered 2012-12-26: 50 mL via INTRAVENOUS

## 2012-12-26 MED ORDER — FUROSEMIDE 10 MG/ML INJECTION SOLUTION
120.0000 mg | Freq: Once | INTRAMUSCULAR | Status: AC
Start: 2012-12-26 — End: 2012-12-26
  Administered 2012-12-26: 120 mg via INTRAVENOUS
  Filled 2012-12-26: qty 12

## 2012-12-26 MED ORDER — VANCOMYCIN PATIENT FLAG
Status: DC
Start: 2012-12-26 — End: 2012-12-29
  Filled 2012-12-26: qty 1

## 2012-12-26 MED ORDER — VALGANCICLOVIR 450 MG TABLET
450.0000 mg | ORAL_TABLET | ORAL | Status: DC
Start: 2012-12-26 — End: 2012-12-31
  Administered 2012-12-26 – 2012-12-29 (×2): 450 mg via ORAL
  Filled 2012-12-26 (×3): qty 1

## 2012-12-26 MED ORDER — ACETAMINOPHEN 650 MG RECTAL SUPPOSITORY
650.0000 mg | RECTAL | Status: DC | PRN
Start: 2012-12-26 — End: 2012-12-30
  Administered 2012-12-26: 650 mg via RECTAL
  Filled 2012-12-26: qty 1

## 2012-12-26 MED ORDER — LIDOCAINE HCL 10 MG/ML (1 %) INJECTION SOLUTION
0.2000 mL | INTRAMUSCULAR | Status: AC | PRN
Start: 2012-12-26 — End: 2012-12-27

## 2012-12-26 MED ORDER — VANCOMYCIN 1 GRAM/200 ML IN DEXTROSE 5 % INTRAVENOUS PIGGYBACK
1000.0000 mg | INJECTION | Freq: Once | INTRAVENOUS | Status: AC
Start: 2012-12-26 — End: 2012-12-26
  Administered 2012-12-26: 1000 mg via INTRAVENOUS
  Filled 2012-12-26: qty 200

## 2012-12-26 MED ORDER — DEPRECATED NACL 0.9% 250ML CARRIER
75.0000 mg | Freq: Once | INTRAVENOUS | Status: DC
Start: 2012-12-26 — End: 2012-12-26
  Filled 2012-12-26: qty 75

## 2012-12-26 NOTE — Progress Notes (Addendum)
TRANSPLANT NEPHROLOGY PROGRESS NOTE  Name: Charles Galvan MRN: [id number]    Note Date and Time: 12/26/2012   Date of Admission: 12/21/2012  7:06 AM    Date of Service: 12/26/2012  Patient's PCP: No Pcp No Pcp      SUMMARY:    [name] is a 47yr old gentleman with history of stage V chronic kidney disease of unknown etiology but possibly attributed to diabetes who received a deceased donor renal transplant  (en-bloc kidneys from a pediatric donor, 30 months male with terminal creatinine of 2.6) on 12/22/12.  He has 0 % PRA and intermediate risk for CMV.  He did require one round of dialysis before and after his transplant surgery.    History antedating transplant is also remarkable for hypertension, diabetes mellitus, HTN, HLD, depression.    INTERVAL HISTORY:   Received 2 units of blood transfusion overnight   Received 2 doses of lasix 80 mg IV in between his transfusion   Per nurse was noted to have an episode of confusion earlier today.   Episode of vomiting earlier today   Had low grade temperatures and cultures sent last night, but high fever spike this am.    SUBJECTIVE/REVIEW OF SYSTEMS:   Having chills   Did not ambulate this morning  Poor appetite.  Poor sleep.  No shortness of breath, cough or expectoration.  Denies chest pain palpitations or light headedness.  + nausea, vomitting  Has an indwelling bladder catheter - hematuric urine  No itching or skin rash.  No joint pain or swelling.  Denies headache, tremor, weakness or numbness.  Mood normal.  Incisional pain slightly worse this morning     Medications  Scheduled Medications     Acetaminophen (TYLENOL) Tablet 650 mg, ORAL, PRE-MED  Acetaminophen (TYLENOL) Tablet 650 mg, ORAL, ONCE  Antithymocyte Globulin (THYMOGLOBULIN) 75 mg in NaCl 0.9% 280 mL (Rabbit) (Central Line Administration) IVPB, IV, ONCE  Aspirin Chewable Tablet 81 mg, ORAL, QAM  Cefepime (MAXIPIME) IV 1 g, IV, Q12H Now, Last Rate: 1 g (12/26/12 0857)  Cholecalciferol (VITAMIN D3)  Tablet/Capsule 1,000 Units, NG, QAM  DiphenhydrAMINE (BENADRYL) Injection 25 mg, IV, PRE-MED  Docusate (COLACE) Capsule 100 mg, ORAL, BID  Epoetin (EPOGEN, PROCRIT) Injection 10,000 Units, SUBCUTANEOUS, Thu  FamoTIDine (PEPCID) 20 mg in NaCl 0.9% 50 mL IVPB, IV, PRE-MED, Last Rate: 20 mg (12/23/12 1012)  FamoTIDine (PEPCID) Tablet 20 mg, ORAL, BID  FUROsemide (LASIX) 120 mg in D5W 50 mL IVPB, IV, ONCE  Insulin Aspart (NOVOLOG) Injection Pen 1-12 Units, SUBCUTANEOUS, TID w/ meals - correction dose  Insulin Aspart (NOVOLOG) Injection Pen 1-4 Units, SUBCUTANEOUS, Daily Bedtime and 0300  Metoprolol Tartrate (LOPRESSOR) Tablet 25 mg, ORAL, BID  Multivitamin (HEXAVITAMIN) Tablet 1 tablet, ORAL, QAM  Mycophenolate (MMF) (CELLCEPT) Capsule 500 mg, ORAL, BID  Polyethylene Glycol 3350 (MIRALAX) Oral Powder Packet 17 g, ORAL, QAM  Pravastatin (PRAVACHOL) Tablet 20 mg, ORAL, QAM  Sennosides (SENOKOT) Tablet 17.2 mg, ORAL, Daily Bedtime  Tacrolimus (FK-506) (PROGRAF) Capsule 5 mg, ORAL, BID (06,18)  Trimethoprim 80 mg/Sulfamethoxazole 400 mg (BACTRIM SS) Tablet 1 tablet, ORAL, QAM  valGANciclovir (VALCYTE) Tablet 450 mg, ORAL, Mon Fri  Vancomycin (VANCOCIN) 1,000 mg in D5W 200 mL IVPB, IV, ONCE, Last Rate: 1,000 mg (12/26/12 0947)  Vancomycin Patient Flag, NOT APPLICABLE, PATIENT FLAG ONLY    IV Medications   PRN Medications     Acetaminophen (TYLENOL) Suppository 650 mg, RECTALLY, Q4H PRN  Acetaminophen (TYLENOL) Tablet 650 mg, ORAL,  Q6H PRN  Dextrose 50% Injection 12.5 g, IV, PRN  Dextrose 50% Injection 25 g, IV, PRN  DiphenhydrAMINE (BENADRYL) Capsule 25 mg, ORAL, Q6H PRN  Glucagon Injection 1 mg, IM, PRN  Heparin (PF) 100 units/mL Flush Syringe 3 mL, Intercatheter, PRN  Hydrocodone 5 mg/Acetaminophen 325 mg (NORCO  5) Tablet 1-2 tablet, ORAL, Q4H PRN  Ondansetron (ZOFRAN) Injection 8 mg, IV, Q8H PRN        Allergies  No Known Allergies     Vital Signs Summary  Temp Min: 36.5 C (97.7 F) Max: 38.9 C (102 F)   Systolic  (24hrs), Avg:160 mmHg, Min:119 mmHg, Max:213 mmHg     Diastolic (24hrs), Avg:76 mmHg, Min:51 mmHg, Max:106 mmHg    Pulse Min: 84  Max: 112   Temp: 38.9 C (102 F)  BP: 164/75 mmHg  Pulse: 112   Resp: 20   SpO2: 94 %  Flow (L/Min)(Oxygen Therapy): 2   Oxygen Concentration (%)(Therapy): (not recorded)  Weight: 80.1 kg (176 lb 9.4 oz)       Resp Min: 18  Max: 20   SpO2 Min: 89 % Max: 99 %  No Data Recorded     Intake and Output:  Current Shift:  In: -   Out: 20 [Emesis:20]   Intake and Output: Last Two Completed Shifts:  I/O Last 2 Completed Shifts:  In: 2640 [Oral:1660; Crystalloid:100; Colloid:280; Blood:600]  Out: 1420 [Urine:1400; Emesis:20]    PHYSICAL EXAM:  GENERAL APPEARANCE: Alert & Oriented,  Mild to moderate distress 2/2 to chills.  EYES:  No conjunctival pallor or scleral icterus. Extraocular movements normal.   MOUTH:  Oral mucosa moist. No oral thrush or ulcers.   CARDIOVASCULAR:  No edema of lower extremities. Jugular venous pressure not elevated. Tachycardia. Normal heart sounds. No murmurs.  RESPIRATORY: clear anterior   GI:  Abdomen mildly distended  GU: Left lower quadrant surgical incision clean & dry. Mild tenderness over renal allograft.  MUSCULOSKELETAL: No joint swelling or deformities.  NERVOUS SYSTEM: No motor or sensory deficits. chills  PSYCH:  Apropriate affect.  Vascular access: LUE AVF   + Lines: + central line and also foley with cherry blood urine    LAB TESTS/STUDIES:  BLOOD COUNTS   Recent labs for the past 48 hours     12/26/12 0850 12/26/12 0502 12/25/12 0500 12/24/12 1030    WHITE BLOOD CELL COUNT 2.6* 2.6* 2.1* 3.9*    RED CELL COUNT 2.81* 2.86* 2.26* 2.47*    HEMOGLOBIN 9.0* 9.2* 7.3* 8.2*    HEMATOCRIT 25.8* 26.3* 21.4* 23.4*    MCV 91.8 92.1 94.4 94.6    MCH 32.0 32.2 32.4 33.1*    RDW 14.4 14.3 12.6 13.1    PLATELET COUNT 96* 95* 78* 86*    NUCLEATED RBC/100 WBC -- -- -- --    BC COMMENTS -- -- -- --      CHEM 20   Recent labs for the past 48 hours     12/26/12 0500  12/25/12 0500    SODIUM 136 134*    POTASSIUM 3.9 4.4    CHLORIDE 105 102    CARBON DIOXIDE TOTAL 18* 19*    CREATININE BLOOD 11.10* 9.91*    UREA NITROGEN, BLOOD (BUN) 78* 66*    GLUCOSE 122* 211*    CALCIUM 7.2* 7.1*    PROTEIN 5.9* 5.8*    ALBUMIN 2.8* 2.8*    ALKALINE PHOSPHATASE (ALP) 50 53    ASPARTATE TRANSAMINASE (AST) 21 20  BILIRUBIN TOTAL 0.6 0.4    ALANINE TRANSFERASE (ALT) 13 13    TRIGLYCERIDE -- --    URIC ACID, BLOOD -- --    LACTATE DEHYDROGENASE -- --    PHOSPHORUS (PO4) 6.3* 6.3*    CHOLESTEROL -- --    MAGNESIUM (MG) -- --        ASSESSMENT AND RECOMMENDATIONS:    1. Deceased donor renal transplant recipient, post-operative day # 4.  Has delayed renal allograft function required dialysis on 12/22/12 post transplant given hyperkalemia.  Possible early signs/symptoms of uremia.  Despite making urine, his creatinine continues to rise.    Plan for dialysis today, minimal UF aim for less than 1 L given possible early SIRS.      2. Fever, tachycardia   + SIRS.  Unclear if possible new infection, now on broad spectrum abx.  Cultures pending.  Likely sources include urine and site of recent surgery.    2. Immunosuppression  Non-sensitized   Given possibility of infection and with low white count, would like to hold his last dose of thymoglobulin today.  Receiving MMF.Tacrolimus .  After source of fevers identified, we can always give him the thymoglobulin.    3. Hypertension  No need for aggressive BP control in a setting of possible infection.    4. Diabetes mellitus  Continue blood sugar monitoring, much better controlled.    5. Antimicrobial prophylaxis  Intermediate risk for CMV disease -switched to oral valgancyclovir once antibody induction has been completed.  Will be started on trimethoprim/sulfamethoxyzole on post-operative day #4 for PJP and urinary infection prophylaxis.    6. Anemia  Now s/p 2 units with adequate response.  He is also on Epogen     Electronically signed  by:  Freeman Caldron   Renal Fellow  Pager: [phone]  PI: [id number]     TEACHING PHYSICIAN ATTESTATION  Patient seen and examined by me with the renal fellow. The history, physical exam and laboratory data were reviewed and confirmed by me. We developed a care plan together as outlined in the fellow's note above.    Electronically signed by:  Michaele Offer, MD  Attending  [phone]  Department of Nephrology    12/26/2012 15:19

## 2012-12-26 NOTE — Nurse Assessment (Addendum)
ASSESSMENT NOTE    Note Started: 12/26/2012, 22:28     Initial assessment completed and recorded in EMR.  Report received from day shift nurse and orders reviewed. Plan of Care reviewed and appropriate, discussed with patient.  No signs of distress, patient undergoing HD tonight.  Metoprolol held until half past 2100.  BP in the 140's to 150's after 1L fluid removal.  Patient remains stable, antibiotics held until HD is completed.  UO redish amber.  Still sediment visible. Vitals remain stable, will continue to assess and address.  Ronnette Juniper RN

## 2012-12-26 NOTE — Plan of Care (Signed)
Problem: General Plan of Care  Goal: Individualization/Patient-Specific Goal  The patient and/or their representative will achieve their patient-specific goals related to the plan of care.    The patient-specific goals include:    Get better and go home   Outcome: Progressing  Dc planned for sat    Problem: Sleep Pattern Disturbance (Adult, Obstetrics)  Goal: Adequate Sleep/Rest (Sleep Pattern Disturbance)  Patient will demonstrate the desired outcomes.   Outcome: Progressing  Care clustered    Problem: Kidney Transplant (Adult)  Goal: Prevent/Manage Potential Problems (Kidney Transplant (Adult))  Outcome: Signs and symptoms of listed potential problems will be absent or manageable (reference CPG)   Outcome: Progressing  Remains on pathway, teaching done, patient stable, last dose of thymo going.  Will continue to monitor for temps.  UO greater than 600 per shift.

## 2012-12-26 NOTE — Progress Notes (Addendum)
Renal Services  Hemodialysis Summary  (see hemodialysis flowsheet for more details)    Date:    12/26/2012  Time:    2200  Duration:   2.5  hours  Dialysis #:   3    Dialyzer:  optiflux 160  Access:  Left, Upper, arm , fistula    Fluid Balance:  Removed:   2000  mL  Given:   1000 mL  Net Removed: 1000  mL    Kt/V:  Volume:  42.4 L  Delivered Kt/Vurea single pool: 0.99  Blood Volume Processed: 55.6  If Volume is unavailable report KTurea only: n/a    Lab Results   Lab Name Value Date/Time    NA 136 12/26/2012  5:00 AM    K 3.9 12/26/2012  5:00 AM    CL 105 12/26/2012  5:00 AM    CO2 18* 12/26/2012  5:00 AM    BUN 78* 12/26/2012  5:00 AM    CR 11.10* 12/26/2012  5:00 AM    GLU 122* 12/26/2012  5:00 AM       Dialysis Solution:  Sodium:   138 mEq/L  Bicarbonate:  30 mEq/L  Potassium:   3 mEq/L  Calcium:   2.5 mEq/L  Phosphorus:  0 mg/dL    Anticoagulation:  Type:    saline  Load:   250 prime  Infused:   700  Total:    950    Lock:  Type:    n/a  Arterial Vol:   n/a mL  Venous Vol: n/a mL    Comment:   Had 2.5 hrs dialysis,tolerated tx well.    To be completed by Attending Nephrologist:  Patient seen and evaluted 1 time(s) during this hemodialysis treatment.      Dr Michaele Offer  Transplant Nephrology  PI [phone]  [phone]

## 2012-12-26 NOTE — Nurse Assessment (Signed)
ASSESSMENT NOTE    Note Started: 12/26/2012, 01:12     Initial assessment completed and recorded in EMR.  Report received from day shift nurse and orders reviewed. Plan of Care reviewed and updated, discussed with patient.  Patient in stable condition, second unit of blood given at start of shift, thymo started at 2330.  No issues noted.  Had recurrent fever and chills.  Blood cultures ordered and taken. No further orders noted, patient temp back in the mid 37 range.  All other vitals stable and within norm limits.  Ronnette Juniper RN

## 2012-12-26 NOTE — Nurse Assessment (Signed)
Pt states he feels much better this evening, afebrile. BP elevated in 170s systolic, transplant team notified. Received order for PRN dose metoprolol if necessary. Wife at bedside. Pt states he has no appetite at this side.    Vanessa Kick, RN

## 2012-12-26 NOTE — Nurse Assessment (Addendum)
ASSESSMENT NOTE    Note Started: 12/26/2012, 19:35     Initial assessment completed and recorded in EMR.  Report received from night shift nurse and orders reviewed. Plan of Care reviewed and appropriate, discussed with patient.      Temp 38.8 upon arrival to shift, tachycardic at 112, pt shivering. O2 sats 86-89% on RA, placed pt on 3L O2 via NC, sats improved to 94%. Pt nauseous and vomiting scant amounts, mostly sputum, antiemetic given. Transplant team immediately notified, Orion Modest NP at bedside. Lactic acid, ABG, CBC, UA and chest X-ray obtained. Placed pt on tele and continuous pulse ox. Cefepime and vanco given. Will continue to monitor.    Vanessa Kick RN

## 2012-12-27 MED ORDER — TACROLIMUS 5 MG CAPSULE, IMMEDIATE-RELEASE
8.0000 mg | ORAL_CAPSULE | Freq: Two times a day (BID) | ORAL | Status: DC
Start: 2012-12-27 — End: 2012-12-29
  Administered 2012-12-27 – 2012-12-29 (×4): 8 mg via ORAL
  Filled 2012-12-27 (×4): qty 3

## 2012-12-27 MED ORDER — INSULIN ASPART (U-100) 100 UNIT/ML (3 ML) SUBCUTANEOUS PEN
1.0000 [IU] | PEN_INJECTOR | Freq: Three times a day (TID) | SUBCUTANEOUS | Status: DC
Start: 2012-12-27 — End: 2012-12-31
  Administered 2012-12-27: 3 [IU] via SUBCUTANEOUS
  Administered 2012-12-28: 1 [IU] via SUBCUTANEOUS
  Administered 2012-12-28: 3 [IU] via SUBCUTANEOUS
  Administered 2012-12-29: 1 [IU] via SUBCUTANEOUS
  Administered 2012-12-30: 2 [IU] via SUBCUTANEOUS
  Administered 2012-12-30: 3 [IU] via SUBCUTANEOUS
  Administered 2012-12-30 – 2012-12-31 (×2): 2 [IU] via SUBCUTANEOUS

## 2012-12-27 MED ORDER — VANCOMYCIN 500 MG INTRAVENOUS SOLUTION
500.0000 mg | Freq: Once | INTRAVENOUS | Status: AC
Start: 2012-12-27 — End: 2012-12-27
  Administered 2012-12-27: 500 mg via INTRAVENOUS
  Filled 2012-12-27: qty 500

## 2012-12-27 MED ORDER — MYCOPHENOLATE MOFETIL 250 MG CAPSULE
750.0000 mg | ORAL_CAPSULE | Freq: Two times a day (BID) | ORAL | Status: DC
Start: 2012-12-27 — End: 2012-12-28
  Administered 2012-12-27 – 2012-12-28 (×2): 750 mg via ORAL
  Filled 2012-12-27 (×2): qty 3

## 2012-12-27 MED ORDER — DEPRECATED NACL 0.9% 250ML CARRIER
75.0000 mg | Freq: Once | INTRAVENOUS | Status: AC
Start: 2012-12-27 — End: 2012-12-27
  Administered 2012-12-27: 75 mg via INTRAVENOUS
  Filled 2012-12-27: qty 75

## 2012-12-27 NOTE — Progress Notes (Signed)
PATIENT:  Charles Galvan, Charles Galvan  MR #:  [id number]  DOB:  1966-09-15  SEX:  M  AGE:  46  SERVICE DATE:  12/27/2012  INTERNAL MEDICINE INPATIENT PROGRESS NOTE    [name] was seen and examined by me this morning.  Recipient of a  small pediatric en bloc kidney transplant December 22, 2012.  PRA 0%.  Intermediate risk for cytomegalovirus disease.  Delayed graft function  after the surgery.    INTERVAL HISTORY:    1.  Started on broad-spectrum antibiotics yesterday, vancomycin and  cefepime, for high fever.  2.  Underwent hemodialysis yesterday evening with ultrafiltration of  approximately 1 L.  Tolerated dialysis well.  3.  Episode of confusion yesterday AM likely related to fevers, which  appears to be better.    SYSTEM REVIEW:    Feeling better and looking better compared to yesterday morning.  Fevers still persist.  Nausea yesterday but no vomiting.  Denies chest  pain, cough or shortness of breath.  In some pain at the surgical  incision site especially on ambulation.  Urinary drainage through  Foley catheter.  No significant lower extremity edema.    CURRENT MEDICATIONS:    Acetaminophen, antithymocyte globulin, baby aspirin, IV cefepime,  cholecalciferol, erythropoietin, famotidine, Norco, insulin,  metoprolol, mycophenolate mofetil, pravastatin, Bactrim,  valganciclovir, vancomycin.    PHYSICAL EXAM:    T-max 100.5 to 101 degrees Fahrenheit yesterday.  Heart rate 80 to 90  per minute.  Blood pressure 154/68 to 180/83.  O2 saturation 97% on 2  L.  Blood sugar 100 to 130 mg/dL.  In's and out's:  One L in, 2 liters  out.  Weight 78 kg (dry weight 75 kg).  Neck:  Triple-lumen catheter  left neck.  Chest:  Clear to auscultation anteriorly.  Cardiac exam:  Soft 2/6 ejection systolic murmur, best heard at the base.  Abdomen is  soft.  Tender to palpation in the left lower quadrant at the site of  recent surgery.  Appropriate incisional tenderness.  No evidence of  wound erythema or drainage.  Extremity exam:  No peripheral  edema.  Neurologic:  Awake and alert.  No focal weakness.  Psychiatric:  Appropriate mood.    LABORATORY DATA:    White count 3.8, hematocrit 24, platelet count 109.  Chemistries  reveal potassium 3.4, CO2 22, BUN 40, creatinine 7 mg/dL, blood  glucose 562 mg/dL.  Liver function tests are stable.  Tacrolimus level  is pending.    ASSESSMENT:    1. Deceased donor kidney transplant, postoperative day 5.  Has delayed  graft function and underwent hemodialysis yesterday which he tolerated  well.  No clinical or biochemical indication for performing dialysis  today.  2. Fever, tachycardia.  Likely related to infection or blood  transfusion reaction.  The patient definitely looks better today as  compared to yesterday.  Cultures are negative so far.  If the patient  has another significant fever spike, I would repeat blood cultures  again.  3. Immunosuppression.  The patient will receive his last dose of  Thymoglobulin today.  He is already receiving tacrolimus and  mycophenolate mofetil for immunosuppression.  4. Hypertension.  Aim for a supine blood pressure control of 150  systolic.  Check orthostatics x 1 to make sure that the patient's  blood pressure is not falling dramatically when he stands up. OK to  increase metoprolol to 50 mg twice a day.  5. History of diabetes.  Blood sugar is under  excellent control at  this time.  6. Antimicrobial prophylaxis.  Currently on IV ganciclovir.  7. Anemia, still persistent.  Blood transfusion as needed.      Report Electronically Signed - 12/27/2012 15:18:49 by  Orie Fisherman, MD  Assistant Professor  Internal Medicine  Transplant Nephrology    MMG/kc  D:  12/27/2012 08:39:02 PDT/PST  T:  12/27/2012 14:49:37 PDT/PST  Job #:  [id number] / [id number]

## 2012-12-27 NOTE — Progress Notes (Signed)
Transplant Surgery Daily Progress Note        Date of Service: 12/27/2012        ID: 47yr old male with ESRD due to diabetic nephropathy s/p Peds-en-bloc DDRT    POD: 5  24 Hour Events:  High fever yesterday morning with toxic appearance, desaturations to mid 80's, mild delirium.  Antibiotic coverage broadened to Vanco and Cefepime.  Dialysis yesterday with one liter off.  Lasix 120 mg IV given yesterday with good response.  This morning appears clinically improved despite continued fever overnight.  Last dose of Thymo held yesterday.    Vital Signs   Summary  Temp: 37.1 C (98.8 F) (03/08 0725)  Temp src: Oral (03/08 0725)  Pulse: 83  (03/08 0725)  BP: 154/68 mmHg (03/08 0725)  Resp: 18  (03/08 0725)  SpO2: 97 % (03/08 0725)  Height: 160 cm (5\' 3" ) (03/02 0840)  Weight: 78.1 kg (172 lb 2.9 oz) (03/08 0600)    Admission Weight: 75.2 kg    PHYSICAL EXAM  General: awake, alert, NAD  Eyes: non-icteric  Resp: right lung clear, left with crackles at base  CV: regular rate, regular rhythm, 2/6 murmur heard best left sternal border  GI: soft, non-distended, wound intact, no erythema  GU: Foley in place, draining pink urine  Neuro: Alert and oriented x 3  Ext: Palpable distal pulses bilaterally  Vascular: left upper extremity fistula with bruit and thrill    Intake/Output Summary (Last 24 hours) at 12/27/12 0940  Last data filed at 12/27/12 0600   Gross per 24 hour   Intake    860 ml   Output   3175 ml   Net  -2315 ml     Urine out:2.2 L  Dialysis out: 1 L  Emesis out : 45 ml    Labs:  Lab Results   Lab Name Value Date/Time    NA 136 12/27/2012  5:00 AM    K 3.3 12/27/2012  5:00 AM    CL 102 12/27/2012  5:00 AM    CO2 22* 12/27/2012  5:00 AM    BUN 39* 12/27/2012  5:00 AM    CR 6.96* 12/27/2012  5:00 AM    GLU 113* 12/27/2012  5:00 AM     Lab Results   Lab Name Value Date/Time    WBC 3.8* 12/27/2012  5:00 AM    HGB 8.5* 12/27/2012  5:00 AM    HCT 24.1* 12/27/2012  5:00 AM    PLT 109* 12/27/2012  5:00 AM       Lab Results   Lab Name Value  Date/Time    TACROLIMUS 2.2* 12/26/2012  5:00 AM     Blood culture, Urine culture pending    Imaging From the Last 24 Hours:   None    ASSESSMENT/PLAN:  86yr year old male s/p Peds-en-bloc Deceased Donor Renal Transplant    Graft: Delayed Graft Function.    -Hemodialysis necessary for hyperkalemia after transplantation on POD 1, again on POD 4  -Reassess need for dialysis daily  -Continue to monitor    Immunosuppression:    Low Immunologic Risk: PRA 0%;   -Thymoglobulin 1.5 mg/kg for 5 total doses, dose 5/5 today  -Solumedrol 3/3 doses completed  -Increase Cellcept to 750 mg PO BID  -Prograf 5 mg po bid, will adjust dose per level    Prophylaxis:    Low Risk CMV (CMV Donor negative, CMV Recipient positive)  -Valcyte and Bactrim    F/E/N: Hypervolemic,  improved after dialysis and lasix yesterday.  -SLIV  -Renal, diabetic diet    CV: Hypertension.  -Continue Metoprolol to 25 mg IBD  -Continue ASA, and Beta-blocker.    Pulm: No current pulmonary issues.  Continue IS, OOB.    GU: Foley Catheter in place.  -plan for Foley removal day of discharge    Endocrine: Diabetes Mellitus Type II, POC Glucose, blood:  [79 mg/dl-138 mg/dl] , better controlled.    -ISS    Neuro: Post-op pain well controlled on Norco.    Heme: CBC stable.  Will continue to monitor.    ID: Febrile with toxic clinical appearance.  Unknown source. Continue Vanco and Cefepime for now.  Follow up cultures.  Will remove central line today after Thymo infusion.  Will narrow antibiotics when source identified.  If no source identified and clinically improved/fevers resolved, will treat for 72 hours with broad spectrum and then narrow to continue treatment for donor enterococcus faecalis UTI (amox four weeks).    Dispo: Ward care.    Orion Modest, MSN, FNP-C  Transplant Coordinator III - Nurse Practitioner  Transplant Surgery  [facility]

## 2012-12-27 NOTE — Nurse Focus (Signed)
Pt ambulated around unit and maintained SpO2 above 90%. Still continues to have short episodes of desaturating into low 80s while in bed, returns above 95% after encouraging pt to take deep breaths. Discussed with Darl Pikes, NP. Will continue to monitor patient on room air.    Rhona Leavens, RN.

## 2012-12-27 NOTE — Plan of Care (Signed)
Problem: General Plan of Care  Goal: Individualization/Patient-Specific Goal  The patient and/or their representative will achieve their patient-specific goals related to the plan of care.    The patient-specific goals include:    Get better and go home   Outcome: Progressing  Patient status imroving    Problem: Sleep Pattern Disturbance (Adult, Obstetrics)  Goal: Adequate Sleep/Rest (Sleep Pattern Disturbance)  Patient will demonstrate the desired outcomes.   Outcome: Progressing  Patterns maintained, care clustered.    Problem: Kidney Transplant (Adult)  Goal: Prevent/Manage Potential Problems (Kidney Transplant (Adult))  Outcome: Signs and symptoms of listed potential problems will be absent or manageable (reference CPG)   Outcome: Other - note required  Bun and Cr were trending up, will continue to monitor morning labs. UO remains excellent.  600cc measure for first half of shift.  Teaching completed, education reinforced until discharge.

## 2012-12-27 NOTE — Nurse Assessment (Addendum)
ASSESSMENT NOTE    Note Started: 12/27/2012, 23:27     Initial assessment completed and recorded in EMR.  Report received from day shift nurse and orders reviewed. Plan of Care reviewed and appropriate, discussed with patient.  No distress, noted, no fevers, central line removed after IV antibiotics were finished.  No issues noted.  Patient reports some fullness in abd.  Incision line still clear and intact.  Will continue to monitor patient status. Ronnette Juniper RN

## 2012-12-27 NOTE — Nurse Assessment (Signed)
ASSESSMENT NOTE    Note Started: 12/27/2012, 08:55     Initial assessment completed and recorded in EMR.  Report received from night shift nurse and orders reviewed. Plan of Care reviewed and appropriate, discussed with patient.      Rhona Leavens, RN

## 2012-12-28 MED ORDER — MYCOPHENOLATE MOFETIL 250 MG CAPSULE
500.0000 mg | ORAL_CAPSULE | Freq: Three times a day (TID) | ORAL | Status: DC
Start: 2012-12-28 — End: 2012-12-31
  Administered 2012-12-28 – 2012-12-31 (×9): 500 mg via ORAL
  Filled 2012-12-28 (×10): qty 2

## 2012-12-28 MED ORDER — METOCLOPRAMIDE 10 MG TABLET
5.0000 mg | ORAL_TABLET | Freq: Four times a day (QID) | ORAL | Status: DC
Start: 2012-12-28 — End: 2012-12-31
  Administered 2012-12-28 – 2012-12-29 (×2): 5 mg via ORAL
  Administered 2012-12-29: 10 mg via ORAL
  Administered 2012-12-29 – 2012-12-31 (×9): 5 mg via ORAL
  Filled 2012-12-28 (×13): qty 1

## 2012-12-28 MED ORDER — SIMETHICONE 80 MG CHEWABLE TABLET
80.0000 mg | CHEWABLE_TABLET | Freq: Once | ORAL | Status: AC
Start: 2012-12-28 — End: 2012-12-28
  Administered 2012-12-28: 80 mg via ORAL
  Filled 2012-12-28: qty 1

## 2012-12-28 NOTE — Progress Notes (Addendum)
Transplant Surgery Daily Progress Note        Date of Service: 12/29/2012        ID: 47yr old male with ESRD due to diabetic nephropathy s/p Peds-en-bloc DDRT    POD: 7    24 Hour Events:  - Continued nausea, emesis yesterday.  KUB without evidence of obstruction.  - Started reglan for possible gastroparesis   - Foley removed, replaced for PVR > 450 cc    Vital Signs   Summary  Temp: 36.4 C (97.5 F) (03/10 0806)  Temp src: Oral (03/10 0806)  Pulse: 78  (03/10 0806)  BP: 152/80 mmHg (03/10 0806)  Resp: 18  (03/10 0806)  SpO2: 99 % (03/10 0806)  Height: 160 cm (5\' 3" ) (03/02 0840)  Weight: 77.8 kg (171 lb 8.3 oz) (03/10 0600)  Admission Weight: 75.2 kg    I/O:  Intake/Output Summary (Last 24 hours) at 12/29/12 1046  Last data filed at 12/29/12 1016   Gross per 24 hour   Intake    920 ml   Output   1825 ml   Net   -905 ml   UOP: 1.9 L, 50 cc/hr      PHYSICAL EXAM  General: awake, alert, NAD  HEENT: sclera clear, EOMI, PERRL, MMM  Resp: lungs clear bilaterally, respirations unlabored  CV: normal rate, regular rhythm, 2/6 murmur heard best left sternal border  GI: soft, moderately distended, minimally tender, wound intact, no erythema  GU: Foley replaced, clear yellow urine  Neuro: Alert and oriented x 3  Ext: Palpable distal pulses bilaterally  Vascular: left upper extremity fistula with bruit and thrill    Labs:  Lab Results   Lab Name Value Date/Time    NA 134* 12/29/2012  5:00 AM    K 2.8* 12/29/2012  5:00 AM    CL 103 12/29/2012  5:00 AM    CO2 19* 12/29/2012  5:00 AM    BUN 49* 12/29/2012  5:00 AM    CR 8.72* 12/29/2012  5:00 AM    GLU 104* 12/29/2012  5:00 AM     Lab Results   Lab Name Value Date/Time    WBC 4.4* 12/29/2012  5:00 AM    HGB 8.6* 12/29/2012  5:00 AM    HCT 24.2* 12/29/2012  5:00 AM    PLT 165 12/29/2012  5:00 AM     Lab Results   Lab Name Value Date/Time    TACROLIMUS 8.1 12/29/2012  5:00 AM     Blood culture (12/25/12): NGTD  Urine culture (12/26/12): negative    Imaging From the Last 24 Hours:   KUB (12/28/12):  Ureteral stents in place      ASSESSMENT/PLAN:  2yr year old male s/p Peds-en-bloc Deceased Donor Renal Transplant    Graft: Delayed Graft Function.    - Hemodialysis necessary for hyperkalemia after transplantation on POD 1, again on POD 4  - Reassess need for dialysis daily  - Continue to monitor  - Nuc Med Study today    Immunosuppression:    Low Immunologic Risk: PRA 0%;   - Thymoglobulin 1.5 mg/kg for 5 total doses, complete  - Solumedrol 3/3 doses completed  - Change Cellcept to 500 mg PO TID for nausea  - Prograf 8 mg po bid, will adjust dose per level    Prophylaxis:    Low Risk CMV (CMV Donor negative, CMV Recipient positive)  - Valcyte and Bactrim    F/E/N: Hypervolemic, stable.  - SLIV  -  Renal, diabetic diet    CV: Hypertension, moderately well-controlled.  - Continue Metoprolol to 25 mg IBD  - Continue ASA, and Pravstatin    Pulm: Desaturations without obvious cause.      - Consider consulting pulmonary or follow up on an outpatient basis for PFTs.    Continue IS, OOB.    ZO:XWRUE removed, replaced for urine retention.    Endocrine: Diabetes Mellitus Type II, POC Glucose, blood:  [103 mg/dl-141 mg/dl] , better controlled.    - ISS    Neuro: Post-op pain well controlled on Norco.    Heme: CBC stable.  Will continue to monitor.    ID: No fever x 48 hrs.    - Continue Vanco and Cefepime for now.    - Follow up cultures.    - If no source identified and clinically improved/fevers resolved, will treat for 72 hours (until Monday 3/10) with broad spectrum and then narrow to continue treatment for donor enterococcus faecalis UTI (amox four weeks).      GI: Nausea with abdominal distention, unchanged  - Continue Reglan scheduled QID    Dispo: Ward care.  Patient should be afebrile for 24 hours off antibiotics before he is sent home.  D/C at the soonest Tuesday if everything in place.    Electronically Signed by:  Billy Coast, MD  General Surgery, PGY1  Transplant Service Pager: [phone]      DATE OF  SERVICE:   12/29/2012  I reviewed this patient's medical record, obtained the history from the patient, reviewed all pertinent available imaging studies, did a physical examination, saw the patient with Dr. Ellene Route and agree with her findings, documentation and treatment plan we developed together.   Electronically signed by: Faythe Ghee, MD, FACS      PI# [id number]  Attending Surgeon; Department of Surgery, Division of Transplantation  [facility]

## 2012-12-28 NOTE — Plan of Care (Signed)
Problem: General Plan of Care  Goal: Individualization/Patient-Specific Goal  The patient and/or their representative will achieve their patient-specific goals related to the plan of care.    The patient-specific goals include:    Get better and go home   Outcome: Progressing  No fevers noted, patient continues positive trends.    Problem: Sleep Pattern Disturbance (Adult, Obstetrics)  Goal: Adequate Sleep/Rest (Sleep Pattern Disturbance)  Patient will demonstrate the desired outcomes.   Outcome: Progressing  Care clustered.    Problem: Kidney Transplant (Adult)  Goal: Prevent/Manage Potential Problems (Kidney Transplant (Adult))  Outcome: Signs and symptoms of listed potential problems will be absent or manageable (reference CPG)   Outcome: Progressing  Remains on pathway.

## 2012-12-28 NOTE — Progress Notes (Signed)
PATIENT:  NOLLAN, MULDROW  MR #:  [id number]  DOB:  October 21, 1966  SEX:  M  AGE:  47  SERVICE DATE:  12/28/2012  INTERNAL MEDICINE INPATIENT PROGRESS NOTE    TRANSPLANT NEPHROLOGY PROGRESS NOTE:    [name] was seen and examined by me this morning.    SUBJECTIVE:    [age]-year-old gentleman with diabetes and stage V kidney disease  who received small pediatric en-bloc kidney transplants, December 22, 2012.  PRA 0%.  Intermediate risk for cytomegalovirus disease.  Delayed graft function after the surgery.    INTERVAL HISTORY:    1)  Fever curve has come down since the administration of broad-  spectrum antibiotics.  Cultures have been negative and the patient  will receive his third day of vancomycin and cefepime.  2) Persists with nausea and vomitting after taking medications this  morning.  One to two episodes of vomiting over the last 24 hours.    SYSTEM REVIEW:    No fever or chills.  Intermittent nausea and vomiting persists.  No  significant pain except at the surgical site.  It does not appear to  me that the pain is any worse as compared to two days ago.  Foley  catheter discontinued this morning.  No significant lower extremity  edema.  Cough with greenish expectoration this morning.  No persistent  cough or shortness of breath.    MEDICATIONS:    Current medications include baby aspirin, cefepime, cholecalciferol,  Colace, erythropoietin, famotidine, metoprolol, multivitamin,  mycophenolate mofetil, ondansetron, Pravachol, tacrolimus, Bactrim,  and valganciclovir.    PHYSICAL EXAMINATION:    GENERAL:  The patient is in distress only when he has nausea and  vomiting.  VITAL SIGNS:  T-max 99.5 degrees, heart rate 80-90 per minute, blood  pressure 140-160 systolic (when he is standing, the blood pressure is  120-130 systolic), pain score 0-4/5.  In's and Out's:  1 L in, 1.5  liters out.  Weight 78 kg (dry weight approximately 75-76 kg).  NECK:  Supple, without lymphadenopathy.  CHEST:  Relatively clear to  auscultation bilaterally.  CARDIAC:  No gallop or rub.  ABDOMEN:  Soft.  Minimal tenderness in the left lower quadrant at the  site of recent surgery.  No erythema.  No drainage.  EXTREMITIES:  No peripheral edema.  NEUROLOGIC:  Awake and alert.  No focal weakness.  PSYCHIATRIC:  Appropriate mood and affect.    LABORATORY DATA:    White count 2.5, hematocrit 24, platelet count 130,000.  Chemistries  reveal potassium 3.3, CO2 20, BUN 46, creatinine 8.23 mg/dL, blood  glucose 454 mg/dL.  Liver function tests are stable.  Tacrolimus level  is pending from today.    ASSESSMENT AND PLAN:    1.  Status post pediatric en-bloc kidney transplant with delayed graft  function.  Plan for next hemodialysis is tomorrow.  The patient will  be placed on his usual Monday/Wednesday/Friday schedule unless there  is evidence of recovery of renal function.  2.  Immunosuppression.  Status post five doses of Thymoglobulin.  Currently on dual therapy with tacrolimus and mycophenolate mofetil.  Tacrolimus levels are being monitored.  3.  Nausea and vomiting.  Likely related to the combination of  diabetic gastroparesis and medications.  I agree with conversion of  mycophenolate mofetil from 750 mg twice a day to 500 mg three times a  day.  If the patient has difficulty tolerating this, the mycophenolate  mofetil should be converted to azathioprine.  Also consider removing  all nonessential medications, such as multivitamin, cholecalciferol,  etc.  Consider conversion from famotidine to pantoprazole therapy. If  nausea and vomitting persist, recommend starting reglan 5 mg before  each meal TID.  4.  Anemia, significant.  If there is a desire for blood transfusions,  please let the Nephrology fellow know tomorrow morning.  Blood  transfusions can be arranged with dialysis therapy.  5.  Hypertension.  Fair control at this time.  Would not be too  aggressive with antihypertensive medications at this time.      Report Electronically Signed -  12/28/2012 13:44:27 by  Orie Fisherman, MD  Assistant Professor  Internal Medicine  Transplant Nephrology    MMG/jm  D:  12/28/2012 09:49:57 PDT/PST  T:  12/28/2012 12:05:07 PDT/PST  Job #:  [id number] / [id number]

## 2012-12-28 NOTE — Progress Notes (Addendum)
Transplant Surgery Daily Progress Note        Date of Service: 12/28/2012        ID: 47yr old male with ESRD due to diabetic nephropathy s/p Peds-en-bloc DDRT    POD: 6  24 Hour Events:  No fever for greater than 24 hours  Reports nausea and feeling bloated today  Oxygen desaturations to the 80's intermittently.  Central line removed.    Vital Signs   Summary  Temp: 36.6 C (97.9 F) (03/09 1110)  Temp src: Oral (03/09 1110)  Pulse: 84  (03/09 1110)  BP: 145/65 mmHg (03/09 1110)  Resp: 18  (03/09 1110)  SpO2: 100 % (03/09 1110)  Height: 160 cm (5\' 3" ) (03/02 0840)  Weight: 77.9 kg (171 lb 11.8 oz) (03/09 0536)    Admission Weight: 75.2 kg    PHYSICAL EXAM  General: awake, alert, NAD  Eyes: non-icteric  Resp: lungs clear bilaterally  CV: regular rate, regular rhythm, 2/6 murmur heard best left sternal border  GI: soft, non-distended, wound intact, no erythema  GU: Foley removed  Neuro: Alert and oriented x 3  Ext: Palpable distal pulses bilaterally  Vascular: left upper extremity fistula with bruit and thrill      Intake/Output Summary (Last 24 hours) at 12/28/12 1144  Last data filed at 12/28/12 1021   Gross per 24 hour   Intake    600 ml   Output   1515 ml   Net   -915 ml     Urine out:1.5 L    Labs:  Lab Results   Lab Name Value Date/Time    NA 136 12/28/2012  5:00 AM    K 3.2* 12/28/2012  5:00 AM    CL 104 12/28/2012  5:00 AM    CO2 20* 12/28/2012  5:00 AM    BUN 48* 12/28/2012  5:00 AM    CR 8.23* 12/28/2012  5:00 AM    GLU 145* 12/28/2012  5:00 AM     Lab Results   Lab Name Value Date/Time    WBC 2.5* 12/28/2012  5:34 AM    HGB 8.4* 12/28/2012  5:34 AM    HCT 24.2* 12/28/2012  5:34 AM    PLT 130 12/28/2012  5:34 AM       Lab Results   Lab Name Value Date/Time    TACROLIMUS 9.8 12/28/2012  5:00 AM     Blood culture and Urine culture negative to date.    Imaging From the Last 24 Hours:   None    ASSESSMENT/PLAN:  31yr year old male s/p Peds-en-bloc Deceased Donor Renal Transplant    Graft: Delayed Graft Function.    -Hemodialysis  necessary for hyperkalemia after transplantation on POD 1, again on POD 4  -Reassess need for dialysis daily  -Continue to monitor  -Nuc Med Study for baseline tomorrow    Immunosuppression:    Low Immunologic Risk: PRA 0%;   -Thymoglobulin 1.5 mg/kg for 5 total doses, complete  -Solumedrol 3/3 doses completed  -Change Cellcept to 500 mg PO TID for nausea  -Prograf 8 mg po bid, will adjust dose per level    Prophylaxis:    Low Risk CMV (CMV Donor negative, CMV Recipient positive)  -Valcyte and Bactrim    F/E/N: Hypervolemic, stable.  -SLIV  -Renal, diabetic diet    CV: Hypertension, moderately well-controlled.  -Continue Metoprolol to 25 mg IBD  -Continue ASA, and Pravstatin    Pulm: Desaturations without obvious cause.  Will continue  to monitor over today.  Patient appears comfortable.  Consider consulting pulmonary or follow up on an outpatient basis for PFTs.    Continue IS, OOB.    [id number] Foley today.    Endocrine: Diabetes Mellitus Type II, POC Glucose, blood:  [829 mg/dl-185 mg/dl] , better controlled.    -ISS    Neuro: Post-op pain well controlled on Norco.    Heme: CBC stable.  Will continue to monitor.    ID: Febrile with toxic clinical appearance - much improved.  Unknown source. Continue Vanco and Cefepime for now.  Follow up cultures.  Will narrow antibiotics when source identified.  If no source identified and clinically improved/fevers resolved, will treat for 72 hours (until Monday 3/10) with broad spectrum and then narrow to continue treatment for donor enterococcus faecalis UTI (amox four weeks).    GI: Nausea with mild abdominal distention, check KUB today to rule out obstruction.    Dispo: Ward care.  Patient should be afebrile for 24 hours off antibiotics before he is sent home.  D/C at the soonest Tuesday if everything in place.      Orion Modest, MSN, FNP-C  Transplant Coordinator III - Nurse Practitioner  Transplant Surgery  [place] [facility]

## 2012-12-28 NOTE — Plan of Care (Addendum)
Problem: Kidney Transplant (Adult)  Goal: Prevent/Manage Potential Problems (Kidney Transplant (Adult))  Outcome: Signs and symptoms of listed potential problems will be absent or manageable (reference CPG)   Outcome: Not progressing - note required  BP remains elevated despite antihypertensives, team made aware. Poor PO intake due to constant nausea with intermittent episodes of emesis. Zofran given as needed, Reglan ATC added to regimen. Pt proficient with transplant teaching. KUB and nuc med scan ordered.

## 2012-12-28 NOTE — Nurse Assessment (Signed)
ASSESSMENT NOTE    Note Started: 12/28/2012, 19:38     Initial assessment completed and recorded in EMR.  Report received from day shift nurse and orders reviewed. Plan of Care reviewed and appropriate, discussed with patient.  Tobi Bastos, RN RN

## 2012-12-28 NOTE — Nurse Assessment (Signed)
ASSESSMENT NOTE    Note Started: 12/28/2012, 10:44     Initial assessment completed and recorded in EMR.  Report received from night shift nurse and orders reviewed. Plan of Care reviewed and appropriate, discussed with patient.  Pt A&Ox4, sitting upright in bed. Pt reports intermittent nausea, green emesis x1 approx . Unable to give antiemetic as it was given earlier by nightshift RN. Team notified and aware, abd x-ray ordered.  Coughing up green sputum with scant amounts of blood, team notified and aware.    Vanessa Kick RN

## 2012-12-28 NOTE — Nurse Focus (Signed)
Central removed at midnight.  Single anchor stitch remains in the neck.  Was unable to remove due proximity to skin.  Patient refused further intervention, MD aware.  Pt requested gas ex, MD informed and approved one time dose.     Rema Fendt RN.

## 2012-12-28 NOTE — Nurse Assessment (Signed)
Foley removed at 0845 per order. Pt instructed to call upon voiding so PVR can be obtained. Pt verbalized understanding.    Vanessa Kick, RN

## 2012-12-29 MED ORDER — METOPROLOL TARTRATE 50 MG TABLET
50.0000 mg | ORAL_TABLET | Freq: Two times a day (BID) | ORAL | Status: DC
Start: 2012-12-29 — End: 2012-12-31
  Administered 2012-12-29 – 2012-12-31 (×4): 50 mg via ORAL
  Filled 2012-12-29 (×4): qty 1

## 2012-12-29 MED ORDER — AMOXICILLIN 500 MG CAPSULE
500.0000 mg | ORAL_CAPSULE | Freq: Two times a day (BID) | ORAL | Status: DC
Start: 2012-12-29 — End: 2012-12-31
  Administered 2012-12-29 – 2012-12-31 (×4): 500 mg via ORAL
  Filled 2012-12-29 (×4): qty 1

## 2012-12-29 MED ORDER — POTASSIUM CHLORIDE 10 MEQ/100ML IN STERILE WATER INTRAVENOUS PIGGYBACK
10.0000 meq | INJECTION | INTRAVENOUS | Status: AC
Start: 2012-12-29 — End: 2012-12-29
  Administered 2012-12-29 (×4): 10 meq via INTRAVENOUS
  Filled 2012-12-29 (×4): qty 100

## 2012-12-29 MED ORDER — BISACODYL 10 MG RECTAL SUPPOSITORY
10.0000 mg | RECTAL | Status: DC | PRN
Start: 2012-12-29 — End: 2012-12-31

## 2012-12-29 MED ORDER — METOPROLOL TARTRATE 25 MG TABLET
25.0000 mg | ORAL_TABLET | Freq: Once | ORAL | Status: AC
Start: 2012-12-29 — End: 2012-12-29
  Administered 2012-12-29: 25 mg via ORAL
  Filled 2012-12-29: qty 1

## 2012-12-29 MED ORDER — OXYCODONE 5 MG TABLET
5.0000 mg | ORAL_TABLET | ORAL | Status: DC | PRN
Start: 2012-12-29 — End: 2012-12-31
  Administered 2012-12-29 – 2012-12-30 (×3): 5 mg via ORAL
  Filled 2012-12-29 (×3): qty 1

## 2012-12-29 MED ORDER — TACROLIMUS 5 MG CAPSULE, IMMEDIATE-RELEASE
10.0000 mg | ORAL_CAPSULE | Freq: Two times a day (BID) | ORAL | Status: DC
Start: 2012-12-29 — End: 2012-12-31
  Administered 2012-12-29 – 2012-12-31 (×4): 10 mg via ORAL
  Filled 2012-12-29 (×4): qty 2

## 2012-12-29 NOTE — Progress Notes (Addendum)
TRANSPLANT NEPHROLOGY PROGRESS NOTE  Name: Charles Galvan MRN: [id number]    Note Date and Time: 12/29/2012   Date of Admission: 12/21/2012  7:06 AM    Date of Service: 12/29/2012  Patient's PCP: No Pcp No Pcp      SUMMARY:    Charles Galvan is a 47yr old gentleman with history of stage V chronic kidney disease of unknown etiology but possibly attributed to diabetes who received a deceased donor renal transplant  (en-bloc kidneys from a pediatric donor, 59 months male with terminal creatinine of 2.6) on 12/22/12.  He has 0 % PRA and intermediate risk for CMV.  He did require one round of dialysis before and after his transplant surgery.  Post transplant complicated by delayed graft function requiring dialysis, febrile episode now on broad abx, and also nausea with vomiting.    History antedating transplant is also remarkable for hypertension, diabetes mellitus, HTN, HLD, depression.    INTERVAL HISTORY:   Persistent nausea with vomiting, started on Reglan   Remains on broad abxn     SUBJECTIVE/REVIEW OF SYSTEMS:   No chills or fever as of this am  Did not ambulate this morning  Poor appetite.  No shortness of breath, cough or expectoration.  Denies chest pain palpitations or light headedness.  + nausea, vomitting  Has an indwelling bladder catheter  No itching or skin rash.  No joint pain or swelling.  Denies headache, tremor, weakness or numbness.  Mood normal.  Incisional pain slightly worse this morning     Medications  Scheduled Medications     Aspirin Chewable Tablet 81 mg, ORAL, QAM  Cefepime (MAXIPIME) IV 1 g, IV, Q12H Now, Last Rate: 1 g (12/28/12 2239)  Cholecalciferol (VITAMIN D3) Tablet/Capsule 1,000 Units, NG, QAM  Docusate (COLACE) Capsule 100 mg, ORAL, BID  Epoetin (EPOGEN, PROCRIT) Injection 10,000 Units, SUBCUTANEOUS, Thu  FamoTIDine (PEPCID) Tablet 20 mg, ORAL, BID  Insulin Aspart (NOVOLOG) Injection Pen 1-12 Units, SUBCUTANEOUS, TID w/ meals - correction dose  Metoclopramide (REGLAN) Tablet 5 mg, ORAL,  QIDAC&Bedtime  Metoprolol Tartrate (LOPRESSOR) Tablet 25 mg, ORAL, BID  Multivitamin (HEXAVITAMIN) Tablet 1 tablet, ORAL, QAM  Mycophenolate (MMF) (CELLCEPT) Capsule 500 mg, ORAL, TID  Polyethylene Glycol 3350 (MIRALAX) Oral Powder Packet 17 g, ORAL, QAM  Potassium Chloride 10 mEq in Sterile Water IVPB, IV, Q1H Now, Last Rate: 10 mEq (12/29/12 0922)  Pravastatin (PRAVACHOL) Tablet 20 mg, ORAL, QAM  Sennosides (SENOKOT) Tablet 17.2 mg, ORAL, Daily Bedtime  Tacrolimus (FK-506) (PROGRAF) Capsule 8 mg, ORAL, BID (06,18)  Trimethoprim 80 mg/Sulfamethoxazole 400 mg (BACTRIM SS) Tablet 1 tablet, ORAL, QAM  valGANciclovir (VALCYTE) Tablet 450 mg, ORAL, Mon Fri  Vancomycin Patient Flag, NOT APPLICABLE, PATIENT FLAG ONLY    IV Medications   PRN Medications     Acetaminophen (TYLENOL) Suppository 650 mg, RECTALLY, Q4H PRN  Acetaminophen (TYLENOL) Tablet 650 mg, ORAL, Q6H PRN  Dextrose 50% Injection 12.5 g, IV, PRN  Dextrose 50% Injection 25 g, IV, PRN  DiphenhydrAMINE (BENADRYL) Capsule 25 mg, ORAL, Q6H PRN  Glucagon Injection 1 mg, IM, PRN  Heparin (PF) 100 units/mL Flush Syringe 3 mL, Intercatheter, PRN  Hydrocodone 5 mg/Acetaminophen 325 mg (NORCO  5) Tablet 1-2 tablet, ORAL, Q4H PRN  Ondansetron (ZOFRAN) Injection 8 mg, IV, Q8H PRN        Allergies  No Known Allergies     Vital Signs Summary  Temp Min: 36.4 C (97.5 F) Max: 37.7 C (99.9 F)   Systolic (  24hrs), Avg:160 mmHg, Min:119 mmHg, Max:213 mmHg     Diastolic (24hrs), Avg:76 mmHg, Min:51 mmHg, Max:106 mmHg    Pulse Min: 78  Max: 94   Temp: 36.4 C (97.5 F)  BP: 152/80 mmHg  Pulse: 78   Resp: 18   SpO2: 99 %  Flow (L/Min)(Oxygen Therapy): 2   Oxygen Concentration (%)(Therapy): (not recorded)  Weight: 77.8 kg (171 lb 8.3 oz)       Resp Min: 18  Max: 18   SpO2 Min: 95 % Max: 100 %  No Data Recorded     Intake and Output:  Current Shift:      Intake and Output: Last Two Completed Shifts:  I/O Last 2 Completed Shifts:  In: 1110 [Oral:1110]  Out: 2240 [Urine:1865;  Emesis:375]    PHYSICAL EXAM:  GENERAL APPEARANCE: Alert & Oriented, no distress   EYES:  No conjunctival pallor or scleral icterus. Extraocular movements normal.    MOUTH:  Oral mucosa moist. No oral thrush or ulcers.   CARDIOVASCULAR:  No edema of lower extremities. Jugular venous pressure not elevated. Tachycardia. Normal heart sounds. No murmurs.  RESPIRATORY: clear anterior   GI:  Abdomen mildly distended, no pain   GU: Left lower quadrant surgical incision clean & dry. No tenderness over renal allograft.  MUSCULOSKELETAL: No joint swelling or deformities.  NERVOUS SYSTEM: No motor or sensory deficits. chills  PSYCH:  Apropriate affect.  Vascular access: LUE AVF     LAB TESTS/STUDIES:  BLOOD COUNTS   Recent labs for the past 48 hours     12/29/12 0500 12/28/12 0534    WHITE BLOOD CELL COUNT 4.4* 2.5*    RED CELL COUNT 2.65* 2.65*    HEMOGLOBIN 8.6* 8.4*    HEMATOCRIT 24.2* 24.2*    MCV 91.4 91.4    MCH 32.3 31.5    RDW 13.8 13.7    PLATELET COUNT 165 130    NUCLEATED RBC/100 WBC -- --    BC COMMENTS -- --      CHEM 20   Recent labs for the past 48 hours     12/29/12 0500 12/28/12 0500    SODIUM 134* 136    POTASSIUM 2.8* 3.2*    CHLORIDE 103 104    CARBON DIOXIDE TOTAL 19* 20*    CREATININE BLOOD 8.72* 8.23*    UREA NITROGEN, BLOOD (BUN) 49* 48*    GLUCOSE 104* 145*    CALCIUM 7.7* 7.6*    PROTEIN 6.4 5.7*    ALBUMIN 2.9* 2.6*    ALKALINE PHOSPHATASE (ALP) 51 49    ASPARTATE TRANSAMINASE (AST) 15 17    BILIRUBIN TOTAL 0.8 0.7    ALANINE TRANSFERASE (ALT) 14 13    TRIGLYCERIDE -- --    URIC ACID, BLOOD -- --    LACTATE DEHYDROGENASE -- --    PHOSPHORUS (PO4) 5.7* 5.5*    CHOLESTEROL -- --    MAGNESIUM (MG) -- --        ASSESSMENT AND RECOMMENDATIONS:    1. Deceased donor renal transplant recipient, post-operative day # 7.  Has delayed renal allograft function required dialysis on 12/22/12 post transplant given hyperkalemia.  Only small rise in creatinine noted overnight, no acute dialysis  needs for today, remains non-oliguric.      2. Febrile episodes   Resolved, however patient continues to receive norco (which contains acetaminophen that can possibly mask his fevers), consider switching to hydrocodone only  Cultures on 3/6 remains negative to date, remains on  broad abx recommend continue broad abx until he is ready for discharge then would be switched to oral abx for his enterococcus.     3. Immunosuppression  Non-sensitized   He has completed his steroids and thymoglobulin.  Receiving MMF.Tacrolimus .    4. Hypertension  Noted to be have higher trend, however this may also be related to increase nausea.    5. Diabetes mellitus  Continue blood sugar monitoring, well controlled.    6. Antimicrobial prophylaxis  Intermediate risk for CMV disease -switched to oral valgancyclovir once antibody induction has been completed.  started on trimethoprim/sulfamethoxyzole on post-operative day #4 for PJP and urinary infection prophylaxis.     7. Anemia  Hgb low but stable, remains on epogen.    8. Nausea with vomiting  Patient mentioned he had nausea and sensitive stomach even before the transplant, suspect possible diabetic gastroparesis.  His nausea with vomiting seemed worse after the transplant.  Hesitant to discharge until patient can hold his medications down.       Case discussed with Dr. Sharlee Blew   Electronically signed by:  Freeman Caldron   Renal Fellow  Pager: [phone]  PI: [id number]       12/29/2012    I have seen and personally examined the patient.  I have reviewed the events of the past 24 hours and discussed the case with Dr. Freeman Caldron, Nephrology Fellow.  I agree with the documented findings as well as the impression and plan or recommendations.     Binnie Rail Sharlee Blew, MD  PI [phone]  Attending, Professor of Medicine   Kidney Transplant Service

## 2012-12-29 NOTE — Progress Notes (Addendum)
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 s/p Peds-en-bloc DDRT    POD: 9    24 Hour Events:  Continued intermittent nausea, emesis throughout the day  Nuclear medicine study showed activity in inferior > superior kidney (~2:1)  Negative cultures to date, no additional fevers, Narrowed antibiotic spectrum to Amox to cover donor UTI  Ambulating frequently    Vital Signs   Temp src:  [-]   Temp:  [36.4 C (97.5 F)-36.9 C (98.4 F)]   Pulse:  [78-84]   BP: (142-158)/(72-80)   Resp:  [16-18]   SpO2:  [97 %-100 %]   Weight: 74.5 kg (164 lb 3.9 oz) (12/30/12 0612)   Admission Weight: 75.2 kg    I/O:  Intake/Output Summary (Last 24 hours) at 12/30/12 0724  Last data filed at 12/30/12 9604   Gross per 24 hour   Intake   1720 ml   Output   3290 ml   Net  -1570 ml   UOP: 3.3 L, 125 cc/hr    PHYSICAL EXAM  General: awake, alert, NAD  HEENT: sclera clear, EOMI, PERRL, MMM  Resp: respirations unlabored  CV: normal rate, regular rhythm, 2/6 murmur heard best left sternal border  GI: soft, moderately distended, minimally tender, wound intact, no erythema  GU: Foley draining clear yellow urine  Ext: Palpable distal pulses bilaterally  Vascular: left upper extremity fistula with bruit and thrill    Labs:  Lab Results   Lab Name Value Date/Time    NA 137 12/30/2012  5:00 AM    K 3.2* 12/30/2012  5:00 AM    CL 107 12/30/2012  5:00 AM    CO2 18* 12/30/2012  5:00 AM    BUN 49* 12/30/2012  5:00 AM    CR 8.41* 12/30/2012  5:00 AM    GLU 184* 12/30/2012  5:00 AM     Lab Results   Lab Name Value Date/Time    WBC 5.4 12/30/2012  5:59 AM    HGB 8.1* 12/30/2012  5:59 AM    HCT 23.4* 12/30/2012  5:59 AM    PLT 203 12/30/2012  5:59 AM     Lab Results   Lab Name Value Date/Time    TACROLIMUS 8.1 12/29/2012  5:00 AM     Blood culture (12/25/12): NGTD  Urine culture (12/26/12): negative    Imaging:  NM Kidney Function:   1. FINDINGS CONSISTENT WITH ACUTE TUBULAR  NECROSIS OF LEFT LOWER QUADRANT PEDIATRIC EN BLOC RENAL TRANSPLANTATION; HOWEVER, AN ELEMENT OF ACUTE REJECTION CANNOT BE ENTIRELY EXCLUDED.  2. DIFFERENTIAL FUNCTION CANNOT BE CALCULATED SECONDARY TO   TRANSPLANT KIDNEY OVERLAPPING THE ANTERIOR PROJECTION, BUT   SEMI-QUANTIFICATION SHOWS APPROXIMATELY 2/3 OF RENAL FUNCTION APPEARS TO BE WITHIN THE MORE INFERIOR TRANSPLANT KIDNEY.      ASSESSMENT/PLAN:  47yr year old male s/p Peds-en-bloc Deceased Donor Renal Transplant    Graft: Delayed Graft Function.    - Hemodialysis necessary for hyperkalemia after transplantation on POD 1, again on POD 4  - Reassess need for dialysis daily  - Continue to monitor    Immunosuppression:    Low Immunologic Risk: PRA 0%;   - Thymoglobulin 1.5 mg/kg for 5 total doses, complete  - Solumedrol 3/3 doses completed  - Change Cellcept to 500 mg PO TID for nausea  - Prograf 8 mg po bid, will  adjust dose per level    Prophylaxis:    Low Risk CMV (CMV Donor negative, CMV Recipient positive)  - Valcyte and Bactrim    F/E/N: Hypervolemic, stable.  - SLIV  - Renal, diabetic diet    CV: Hypertension, moderately well-controlled.  - Continue Metoprolol to 25 mg IBD  - Continue ASA, and Pravstatin    Pulm: Desaturations without obvious cause.      - Consider consulting pulmonary or follow up on an outpatient basis for PFTs.    Continue IS, OOB.    ZO:XWRUE removed, replaced for urine retention.    Endocrine: Diabetes Mellitus Type II, POC Glucose, blood:  [87 mg/dl-148 mg/dl] , better controlled.    - ISS    Neuro: Post-op pain well controlled on Norco.    Heme: CBC stable.  Will continue to monitor.    ID: No fever    - Follow up cultures.    - Amoxicillin x4 weeks for donor enterococcus faecalis UTI (amox four weeks).      GI: Nausea with abdominal distention, unchanged  - Continue Reglan scheduled QID    Dispo: Ward care.  Patient should be afebrile for 24 hours off antibiotics before he is sent home.    Electronically Signed by:  Billy Coast, MD  General Surgery, PGY1  Transplant Service Pager: [phone]          DATE OF SERVICE:   12/30/2012  I reviewed this patient's medical record, obtained the history from the patient, reviewed all pertinent available imaging studies, did a physical examination, saw the patient with Dr. Ellene Route and agree with her findings, documentation and treatment plan we developed together.   Electronically signed by: Faythe Ghee, MD, FACS      PI# [phone]  Attending Surgeon; Department of Surgery, Division of Transplantation  [facility]

## 2012-12-29 NOTE — Plan of Care (Signed)
Problem: General Plan of Care  Goal: Plan of Care Review  The patient and/or their representative will communicate an understanding of their plan of care.    The patient-specific goals include:   Outcome: Progressing  Plan of care reviewed with the pt   Pt received 4 KCL series today, ambulated,   Foley to gravity, some clots noted, irrigated as needed to maintain urine flow.         Problem: Kidney Transplant (Adult)  Goal: Prevent/Manage Potential Problems (Kidney Transplant (Adult))  Outcome: Signs and symptoms of listed potential problems will be absent or manageable (reference CPG)   Outcome: Progressing  Medications and  there effects / rationale for medications

## 2012-12-29 NOTE — Nurse Assessment (Signed)
ASSESSMENT NOTE    Note Started: 12/29/2012, 23:47     Initial assessment completed and recorded in EMR.  Report received from day shift nurse and orders reviewed. Plan of Care reviewed and appropriate, discussed with patient. At no distress. Speaks in full sentences. Follows commands. Complains of bladder fullness and urge to urinate. Foley irrigated with clots noted. Incision approximated. Call light within reach.  Alyvia Derk Lorelee Cover, RN RN

## 2012-12-29 NOTE — Nurse Focus (Signed)
Pt foley not drainaing, some clots noted,  Called MD to notify, spoke to [place], NP, have orders to irrigate foley  Foley irrigated, to remove clots.

## 2012-12-29 NOTE — Nurse Focus (Signed)
Some very small  urine clots noted, do not obsturct urine flow, foley to gravity, flowing well, no obstruction, urine pink in color,

## 2012-12-29 NOTE — Nurse Assessment (Signed)
ASSESSMENT NOTE    Note Started: 12/29/2012, 08:13     Initial assessment completed and recorded in EMR.    Report received from the night  shift nurse and orders reviewed.   Plan of Care reviewed and  discussed with  the patient.   Pt alert and orientated, resting in bed, no distress noted.  Abdominal incision intact, no redness, or drainage noted.  Foley to gravity, pinkish, small clots, urine flowing well, no obstruction noted.     Tish Frederickson, RN

## 2012-12-29 NOTE — Allied Health Progress (Signed)
CLINICAL CASE MANAGEMENT PROGRESS NOTE    Comments:   CM to continue to follow, post transplant complicated by delayed graft function requiring dialysis, febrile episode now on broad abx, and also nausea with vomiting.    Further follow-up needed:   CM will continue to attend rounds for any discharge needs.    Date: 12/29/2012  Time: 16:29    Electronically Signed by:  Theodoro Parma RN CCM   [phone]

## 2012-12-30 ENCOUNTER — Ambulatory Visit: Payer: MEDICARE

## 2012-12-30 ENCOUNTER — Ambulatory Visit: Payer: MEDICARE | Admitting: Registered"

## 2012-12-30 MED ORDER — POTASSIUM CHLORIDE 10 MEQ/100ML IN STERILE WATER INTRAVENOUS PIGGYBACK
10.0000 meq | INJECTION | INTRAVENOUS | Status: AC
Start: 2012-12-30 — End: 2012-12-30
  Administered 2012-12-30 (×3): 10 meq via INTRAVENOUS
  Filled 2012-12-30: qty 300

## 2012-12-30 NOTE — Nurse Focus (Signed)
ASSESSMENT NOTE    Note Started: 12/30/2012, 08:50     Initial assessment completed and recorded in EMR.  Report received from night shift nurse and orders reviewed. Plan of Care reviewed and appropriate, discussed with patient.  Foley draining red urine with clots. At this time pt denies bladder pressure/discomfort. NOC shift reports flushing foley x 4 last night.  H/H trending down, pt denies SOB, chest pain, palpitations, not tachycardic, does endorse fatigue.  Angla Delahunt, Charity fundraiser

## 2012-12-30 NOTE — Nurse Assessment (Signed)
ASSESSMENT NOTE    Note Started: 12/30/2012, 22:18     Initial assessment completed and recorded in EMR.  Report received from day shift nurse and orders reviewed. Plan of Care reviewed and appropriate, discussed with patient. At no distress. PRBC #1 done, no signs of reaction. Will start PRBC #2. Foley intact. Incision approximated. Call light within reach. Wife at bedside.  Malon Siddall Lorelee Cover, RN RN

## 2012-12-30 NOTE — Progress Notes (Addendum)
TRANSPLANT NEPHROLOGY PROGRESS NOTE  Name: Charles Galvan MRN: [id number]    Note Date and Time: 12/30/2012   Date of Admission: 12/21/2012  7:06 AM    Date of Service: 12/30/2012  Patient's PCP: No Pcp No Pcp      SUMMARY:    Charles Galvan is a 47yr old gentleman with history of stage V chronic kidney disease of unknown etiology but possibly attributed to diabetes who received a deceased donor renal transplant  (en-bloc kidneys from a pediatric donor, 47 months male with terminal creatinine of 2.6) on 12/22/12.  He has 0 % PRA and intermediate risk for CMV.  He did require one round of dialysis before and after his transplant surgery.  Post transplant complicated by delayed graft function requiring dialysis, febrile episode now on broad abx, and also nausea with vomiting.    History antedating transplant is also remarkable for hypertension, diabetes mellitus, HTN, HLD, depression.    INTERVAL HISTORY:   Amoxicillin started   Hemodynamically stable, afebrile overnight   Passing some clots in his foley.      SUBJECTIVE/REVIEW OF SYSTEMS:   No chills or fever as of this am  ambulate this morning  Improving appetite.   Had some loose stools last night   No shortness of breath, cough or expectoration.  Denies chest pain palpitations or light headedness.  Slight nausea but much improved, no vomiting this am   Has an indwelling bladder catheter   No itching or skin rash.   No joint pain or swelling.  Denies headache, tremor, weakness or numbness.  Mood normal.  Incisional pain present     Medications  Scheduled Medications     Amoxicillin (AMOXIL) Capsule 500 mg, ORAL, Q12H Now  Aspirin Chewable Tablet 81 mg, ORAL, QAM  Cholecalciferol (VITAMIN D3) Tablet/Capsule 1,000 Units, NG, QAM  Docusate (COLACE) Capsule 100 mg, ORAL, BID  Epoetin (EPOGEN, PROCRIT) Injection 10,000 Units, SUBCUTANEOUS, Thu  FamoTIDine (PEPCID) Tablet 20 mg, ORAL, BID  Insulin Aspart (NOVOLOG) Injection Pen 1-12 Units, SUBCUTANEOUS, TID w/ meals -  correction dose  Metoclopramide (REGLAN) Tablet 5 mg, ORAL, QIDAC&Bedtime  Metoprolol Tartrate (LOPRESSOR) Tablet 50 mg, ORAL, BID  Multivitamin (HEXAVITAMIN) Tablet 1 tablet, ORAL, QAM  Mycophenolate (MMF) (CELLCEPT) Capsule 500 mg, ORAL, TID  Polyethylene Glycol 3350 (MIRALAX) Oral Powder Packet 17 g, ORAL, QAM  Potassium Chloride 10 mEq in Sterile Water IVPB, IV, Q1H Now  Pravastatin (PRAVACHOL) Tablet 20 mg, ORAL, QAM  Sennosides (SENOKOT) Tablet 17.2 mg, ORAL, Daily Bedtime  Tacrolimus (FK-506) (PROGRAF) Capsule 10 mg, ORAL, BID (06,18)  Trimethoprim 80 mg/Sulfamethoxazole 400 mg (BACTRIM SS) Tablet 1 tablet, ORAL, QAM  valGANciclovir (VALCYTE) Tablet 450 mg, ORAL, Mon Fri    IV Medications   PRN Medications     Acetaminophen (TYLENOL) Suppository 650 mg, RECTALLY, Q4H PRN  Acetaminophen (TYLENOL) Tablet 650 mg, ORAL, Q6H PRN  Bisacodyl (DULCOLAX) Suppository 10 mg, RECTALLY, Q24H PRN  Dextrose 50% Injection 12.5 g, IV, PRN  Dextrose 50% Injection 25 g, IV, PRN  DiphenhydrAMINE (BENADRYL) Capsule 25 mg, ORAL, Q6H PRN  Glucagon Injection 1 mg, IM, PRN  Heparin (PF) 100 units/mL Flush Syringe 3 mL, Intercatheter, PRN  Ondansetron (ZOFRAN) Injection 8 mg, IV, Q8H PRN  Oxycodone (ROXICODONE) Tablet 5 mg, ORAL, Q4H PRN        Allergies  No Known Allergies     Vital Signs Summary  Temp Min: 36.5 C (97.7 F) Max: 36.9 C (98.4 F)   Systolic (24hrs),  Avg:160 mmHg, Min:119 mmHg, Max:213 mmHg     Diastolic (24hrs), Avg:76 mmHg, Min:51 mmHg, Max:106 mmHg    Pulse Min: 78  Max: 88   Temp: 36.7 C (98.1 F)  BP: 157/74 mmHg  Pulse: 88   Resp: 18   SpO2: 98 %  Flow (L/Min)(Oxygen Therapy): 2   Oxygen Concentration (%)(Therapy): (not recorded)  Weight: 74.5 kg (164 lb 3.9 oz)       Resp Min: 16  Max: 18   SpO2 Min: 97 % Max: 100 %  No Data Recorded     Intake and Output:  Current Shift:      Intake and Output: Last Two Completed Shifts:  I/O Last 2 Completed Shifts:  In: 1720 [Oral:1320; Crystalloid:400]  Out: 3290  [Urine:3290]    PHYSICAL EXAM:  GENERAL APPEARANCE: Alert & Oriented, NAD   EYES:  No conjunctival pallor or scleral icterus. Extraocular movements normal.    MOUTH:  Oral mucosa dry  CARDIOVASCULAR:  No edema of lower extremities. Jugular venous pressure not elevated. Regular rate. Normal heart sounds. No murmurs.  RESPIRATORY: clear anterior   GI:  Abdomen mildly distended, no pain   GU: Left lower quadrant surgical incision clean & dry. No tenderness over renal allograft.  MUSCULOSKELETAL: No joint swelling or deformities.  NERVOUS SYSTEM: No motor or sensory deficits. chills  PSYCH:  Apropriate affect.  Vascular access: LUE AVF     LAB TESTS/STUDIES:  BLOOD COUNTS   Recent labs for the past 48 hours     12/30/12 0559 12/29/12 0500    WHITE BLOOD CELL COUNT 5.4 4.4*    RED CELL COUNT 2.54* 2.65*    HEMOGLOBIN 8.1* 8.6*    HEMATOCRIT 23.4* 24.2*    MCV 92.2 91.4    MCH 31.8 32.3    RDW 13.9 13.8    PLATELET COUNT 203 165    NUCLEATED RBC/100 WBC -- --    BC COMMENTS -- --      CHEM 20   Recent labs for the past 48 hours     12/30/12 0500 12/29/12 0500    SODIUM 137 134*    POTASSIUM 3.2* 2.8*    CHLORIDE 107 103    CARBON DIOXIDE TOTAL 18* 19*    CREATININE BLOOD 8.41* 8.72*    UREA NITROGEN, BLOOD (BUN) 49* 49*    GLUCOSE 184* 104*    CALCIUM 8.1* 7.7*    PROTEIN 5.9* 6.4    ALBUMIN 2.8* 2.9*    ALKALINE PHOSPHATASE (ALP) 57 51    ASPARTATE TRANSAMINASE (AST) 17 15    BILIRUBIN TOTAL 0.7 0.8    ALANINE TRANSFERASE (ALT) 14 14    TRIGLYCERIDE -- --    URIC ACID, BLOOD -- --    LACTATE DEHYDROGENASE -- --    PHOSPHORUS (PO4) 5.9* 5.7*    CHOLESTEROL -- --    MAGNESIUM (MG) -- --        ASSESSMENT AND RECOMMENDATIONS:    1. Deceased donor renal transplant recipient, post-operative day # 8.  Has delayed renal allograft function required dialysis on 12/22/12 post transplant given hyperkalemia.  No acute dialysis needs for today, remains non-oliguric and possible early recovery     2. Febrile  episodes   Resolved, now on amoxicillin for enterococcus coverage (donor + culture), culture obtained here are negative to date     3. Immunosuppression  Non-sensitized   He has completed his steroids and thymoglobulin.  Receiving MMF.Tacrolimus .    4.  Hypertension  Metoprolol has been increased to 50 mg twice a day, however suspect this may be also related to his nausea.     5. Diabetes mellitus  Continue blood sugar monitoring, ranges in the low 200.       6. Antimicrobial prophylaxis  Intermediate risk for CMV disease -taking oral valgancyclovir .  started on trimethoprim/sulfamethoxyzole on post-operative day #4 for PJP and urinary infection prophylaxis.     7. Anemia  Hgb low but stable, remains on epogen.    8. Nausea with vomiting  This seems to have improved once MMF switched to TID and also started on reglan.    Case discussed with Dr. Sharlee Blew   Electronically signed by:  Freeman Caldron   Renal Fellow  Pager: [phone]  PI: [id number]     12/30/2012    I have seen and personally examined the patient.  I have reviewed the events of the past 24 hours and discussed the case with Dr. Freeman Caldron, Nephrology Fellow.  I agree with the documented findings as well as the impression and plan or recommendations.       Binnie Rail Sharlee Blew, MD  PI [phone]  Attending, Professor of Medicine   Kidney Transplant Service

## 2012-12-30 NOTE — Nurse Focus (Signed)
Pt/family education:     Pt and pt's wife instructed on flushing foley catheter, and foley/leg bag care at home.  They did not return demonstration of flushing foley, but will have them participate tomorrow prior to pt being discharged. Understanding of teaching stated.     Have instructed pt and pt's wife on use of Novolog insulin pen today. Pt set up and administered his ss insulin dose today for lunch with prompting and assistance.  Pt to practice more with Novolog pen prior to discharge. Transplant team has not confirmed that pt is to be sent home on Novolog and ss. Pt was using oral hyperglycemic agents prior to transplant, but states he has used injectable insulin in the past using insulin syringe and drawing up from bottle. He has an Accucheck glucometer at the bedside and states that he is comfortable with using it properly.    Jyden Kromer, Charity fundraiser

## 2012-12-30 NOTE — Nurse Focus (Signed)
Pt's wife set up and administered dinner time Novolog insulin to patient with minimal assistance/prompting. Tilda Samudio, Charity fundraiser

## 2012-12-30 NOTE — Allied Health Progress (Signed)
ADULT INITIAL NUTRITION ASSESSMENT    Admission Date:   12/21/2012   Date of Service:    12/30/2012, 09:19     Reason For Assessment:     LOS and decreased intake    Assessment:  Admission Diagnosis: 47 year old male who received a DDRT (en-bloc kidney from a pediatric donor) on 12/22/12, post transplant complicated by delayed graft function, requiring dialysis 3/7, febrile episode, nausea and vomiting     Pertinent Medical History:    -Diabetes mellitus, HTN, ESRD-requiring hemodialysis    Subjective:  Patient reports not eating too well for the past few days related to nausea, emesis 2 days ago and generally not feeling well.  Endorses eating one big meal at night, does not eat much during the day, as he has diarrhea after eating and does not want to use restrooms at place of work.  C/o history of nausea/dry heaves at home also.  Does not check blood sugars at home, states at the dialysis clinic his labs were checked and they were OK.  Patient does not know his A1c lab value.  Was taking glyburide for diabetes.  Weight stable, dry weight ~ 74- 75 kgs.  Was drinking Monster diet drinks at home.      Food and nutrition related history:   Previously prescribed diets: Low K, low phosphorus diet, avoids desserts, sugary drinks  Culture/Religious Food Preferences:  [place] cuisine  Food Allergies: None    Nutrition Order:   Modified diet: Renal diet, carbobohydrate controlled diet 60- 75 gms carbs/meal, Nepro TID    IV Fluids:   None   Pertinent Medications:      Cholecalciferol (VITAMIN D3) Tablet/Capsule 1,000 Units, NG, QAM  Docusate (COLACE) Capsule 100 mg, ORAL, BID  Epoetin (EPOGEN, PROCRIT) Injection 10,000 Units, SUBCUTANEOUS, Thu  FamoTIDine (PEPCID) Tablet 20 mg, ORAL, BID  Insulin Aspart (NOVOLOG) Injection Pen 1-12 Units, SUBCUTANEOUS, TID w/ meals - correction dose  Metoclopramide (REGLAN) Tablet 5 mg, ORAL, QIDAC&Bedtime  Multivitamin (HEXAVITAMIN) Tablet 1 tablet, ORAL, QAM  Mycophenolate (MMF) (CELLCEPT)  Capsule 500 mg, ORAL, TID  Polyethylene Glycol 3350 (MIRALAX) Oral Powder Packet 17 g, ORAL, QAM  Sennosides (SENOKOT) Tablet 17.2 mg, ORAL, Daily Bedtime  Tacrolimus (FK-506) (PROGRAF) Capsule 10 mg, ORAL, BID (06,18)  Ondansetron (ZOFRAN) Injection 8 mg, IV, Q8H PRN    Pertinent Labs:    Lab Results   Lab Name Value Date/Time    NA 137 12/30/2012  5:00 AM    K 3.2* 12/30/2012  5:00 AM    CL 107 12/30/2012  5:00 AM    CO2 18* 12/30/2012  5:00 AM    BUN 49* 12/30/2012  5:00 AM    CR 8.41* 12/30/2012  5:00 AM    GLU 184* 12/30/2012  5:00 AM     POC Glucose, blood:  [87 mg/dl-208 mg/dl]   1/61- P- 5.9       Nutrition Focused Physical Findings:   Digestive Systems: Last BM:3/10  Skin: compromised: incision LLQ  Urine output: 1400- 2265 mL past few days      Height: 160 cms ( 5'3")    Admission Weight:3/2- 75.2 kgs (165 lbs) actual standing, no edema    Current Weight: 3/11-  74.5 kg (164 lb) -actual, standing, 2+ generalized edema   BMI: 29.37   Ideal Body Weight:  56.4 kgs   % Ideal Body Weight:  133%   Adjusted  Weight:  61.1 kgs    Usual Body Weight:  74- 75 kgs  Weight Change:  0.7 kgs less than admit weight, most likely related to fluid shifts, as well as pt not eating too well           Estimated Nutrition Needs:     2000- 2300 Kcal/day Using [facility] x 1.3-1.5   (with 75.2 kg)   73-91 g protein/day 1.2-1.5 g of protein/kg (with 61 kg)   Per MD or 1830  mL fluid/day 30  mL fluid/kg (with 61 kg)      Average Nutrition Intake:  43% meals consumed past 7 days (= 925 kcals, 42 gms protein)    Nutrition Diagnosis:   Inadequate oral intake related to nausea, vomiting as evidenced by pt meeting 46% estimated energy needs, 58% estimated protein needs.  NI-2.1     Tolerating diet in small amounts today, feeling better, less nausea today.  Has tried Nepro supplement and did not tolerate it well.  Discussed availability of Isopure supplement and  patient is willing to try.  Renal, carbohydrate controlled diet appropriate.  Low K levels probably from patient not eating too well, will continue to monitor K levels, and may benefit from diet liberalization if K levels remain low.     Discussed low fat, low fiber diet modifications to help with nausea, diarrhea.  Per MD notes 3/10-  suspect possible diabetic gastroparesis   Noted patient on hexavitamins, patient to benefit from multivitamins with minerals daily.     Current Nutrition Risk: Moderate    Nutrition Intervention:    1) Continue current diet order     2) Oral supplement    -Suggest Isopure supplements TID, suggest d/c Nepro as pt not drinking them  3) Collaboration with other providers    -Monitor K levels and adjust renal diet as appropriate   -Suggest multivitamins with minerals  4) Nutrition Education:    -Reinforced Nutrition after Kidney transplant, food safety guidelines with pt/wife    Nutrition Monitoring and Evaluation:    1) Total energy intake      Goal: to neat at least 1800 kcals per day  2) Total protein intake      Goal: to net at least 65 gms per day  3) Electrolyte Profile :Potassium     Goal: WDL    Report Electronically Signed by:   Zeb Comfort, MS, RD, CDE  Pager: [phone]

## 2012-12-31 ENCOUNTER — Encounter: Payer: Self-pay | Admitting: Acute Care

## 2012-12-31 ENCOUNTER — Other Ambulatory Visit: Payer: Self-pay

## 2012-12-31 MED ORDER — TACROLIMUS 5 MG CAPSULE, IMMEDIATE-RELEASE
9.0000 mg | ORAL_CAPSULE | Freq: Two times a day (BID) | ORAL | Status: DC
Start: 2012-12-31 — End: 2012-12-31

## 2012-12-31 MED ORDER — INSULIN ASPART (U-100) 100 UNIT/ML (3 ML) SUBCUTANEOUS PEN
PEN_INJECTOR | SUBCUTANEOUS | 5 refills | Status: DC
Start: 2012-12-31 — End: 2013-09-02
  Filled 2012-12-31: fill #0
  Filled 2013-02-13: qty 15, 30d supply, fill #0
  Filled 2013-04-14: qty 15, 30d supply, fill #1
  Filled 2013-05-11: qty 15, 30d supply, fill #2
  Filled 2013-06-09: qty 15, 30d supply, fill #3
  Filled 2013-07-08: qty 15, 30d supply, fill #4
  Filled 2013-08-04: qty 15, 30d supply, fill #5

## 2012-12-31 MED ORDER — TACROLIMUS 1 MG CAPSULE, IMMEDIATE-RELEASE
9.0000 mg | ORAL_CAPSULE | Freq: Two times a day (BID) | ORAL | 6 refills | Status: DC
Start: 2012-12-31 — End: 2013-01-02

## 2012-12-31 MED ORDER — VALGANCICLOVIR 450 MG TABLET
450.0000 mg | ORAL_TABLET | ORAL | 0 refills | Status: DC
Start: 2013-01-02 — End: 2013-01-13

## 2012-12-31 MED ORDER — MYCOPHENOLATE MOFETIL 250 MG CAPSULE
500.0000 mg | ORAL_CAPSULE | Freq: Three times a day (TID) | ORAL | 3 refills | Status: DC
Start: 2012-12-31 — End: 2013-03-05
  Filled 2013-01-13 – 2013-01-20 (×2): qty 180, 30d supply, fill #0
  Filled 2013-02-17: qty 180, 30d supply, fill #1

## 2012-12-31 MED ORDER — AMOXICILLIN 500 MG CAPSULE
500.0000 mg | ORAL_CAPSULE | Freq: Two times a day (BID) | ORAL | 0 refills | Status: AC
Start: 2012-12-31 — End: 2013-01-19
  Filled 2012-12-31: qty 40, 20d supply, fill #0

## 2012-12-31 MED ORDER — METOCLOPRAMIDE 5 MG TABLET
5.0000 mg | ORAL_TABLET | Freq: Four times a day (QID) | ORAL | 2 refills | Status: DC
Start: 2012-12-31 — End: 2013-01-13
  Filled 2012-12-31: qty 120, 30d supply, fill #0

## 2012-12-31 MED ORDER — METOPROLOL TARTRATE 50 MG TABLET
50.0000 mg | ORAL_TABLET | Freq: Two times a day (BID) | ORAL | 6 refills | Status: DC
Start: 2012-12-31 — End: 2013-01-20
  Filled 2012-12-31: qty 60, 30d supply, fill #0
  Filled 2013-01-13: qty 60, 30d supply, fill #1

## 2012-12-31 NOTE — Progress Notes (Addendum)
TRANSPLANT NEPHROLOGY PROGRESS NOTE  Name: Charles Galvan MRN: [id number]    Note Date and Time: 12/31/2012   Date of Admission: 12/21/2012  7:06 AM    Date of Service: 12/31/2012  Patient's PCP: No Pcp No Pcp      SUMMARY:    [name] is a 47yr old gentleman with history of stage V chronic kidney disease of unknown etiology but possibly attributed to diabetes who received a deceased donor renal transplant  (en-bloc kidneys from a pediatric donor, 95 months male with terminal creatinine of 2.6) on 12/22/12.  He has 0 % PRA and intermediate risk for CMV.  He did require one round of dialysis before and after his transplant surgery.  Post transplant complicated by delayed graft function requiring dialysis, febrile episode now on broad abx, and also nausea with vomiting.    History antedating transplant is also remarkable for hypertension, diabetes mellitus, HTN, HLD, depression.    INTERVAL HISTORY:   Hemodynamically stable  Continues to pass small clots in his foley.      SUBJECTIVE/REVIEW OF SYSTEMS:   Ambulated earlier today.  Good appetite.   Loose stools improving   No shortness of breath, cough or expectoration.  Denies chest pain palpitations or light headedness.  Slight nausea but much improved, no vomiting this am   Has an indwelling bladder catheter   No itching or skin rash.   No joint pain or swelling.  Denies headache, tremor, weakness or numbness.  Mood normal.  Incisional pain present     Medications  Scheduled Medications     Amoxicillin (AMOXIL) Capsule 500 mg, ORAL, Q12H Now  Aspirin Chewable Tablet 81 mg, ORAL, QAM  Cholecalciferol (VITAMIN D3) Tablet/Capsule 1,000 Units, NG, QAM  Docusate (COLACE) Capsule 100 mg, ORAL, BID  Epoetin (EPOGEN, PROCRIT) Injection 10,000 Units, SUBCUTANEOUS, Thu  FamoTIDine (PEPCID) Tablet 20 mg, ORAL, BID  Insulin Aspart (NOVOLOG) Injection Pen 1-12 Units, SUBCUTANEOUS, TID w/ meals - correction dose  Metoclopramide (REGLAN) Tablet 5 mg, ORAL, QIDAC&Bedtime  Metoprolol  Tartrate (LOPRESSOR) Tablet 50 mg, ORAL, BID  Multivitamin (HEXAVITAMIN) Tablet 1 tablet, ORAL, QAM  Mycophenolate (MMF) (CELLCEPT) Capsule 500 mg, ORAL, TID  Polyethylene Glycol 3350 (MIRALAX) Oral Powder Packet 17 g, ORAL, QAM  Pravastatin (PRAVACHOL) Tablet 20 mg, ORAL, QAM  Sennosides (SENOKOT) Tablet 17.2 mg, ORAL, Daily Bedtime  Tacrolimus (FK-506) (PROGRAF) Capsule 10 mg, ORAL, BID (06,18)  Trimethoprim 80 mg/Sulfamethoxazole 400 mg (BACTRIM SS) Tablet 1 tablet, ORAL, QAM  valGANciclovir (VALCYTE) Tablet 450 mg, ORAL, Mon Fri    IV Medications   PRN Medications     Acetaminophen (TYLENOL) Tablet 650 mg, ORAL, Q6H PRN  Bisacodyl (DULCOLAX) Suppository 10 mg, RECTALLY, Q24H PRN  Dextrose 50% Injection 12.5 g, IV, PRN  Dextrose 50% Injection 25 g, IV, PRN  DiphenhydrAMINE (BENADRYL) Capsule 25 mg, ORAL, Q6H PRN  Glucagon Injection 1 mg, IM, PRN  Heparin (PF) 100 units/mL Flush Syringe 3 mL, Intercatheter, PRN  Ondansetron (ZOFRAN) Injection 8 mg, IV, Q8H PRN  Oxycodone (ROXICODONE) Tablet 5 mg, ORAL, Q4H PRN        Allergies  No Known Allergies     Vital Signs Summary  Temp Min: 36.7 C (98.1 F) Max: 37.4 C (99.3 F)   Systolic (24hrs), Avg:160 mmHg, Min:119 mmHg, Max:213 mmHg     Diastolic (24hrs), Avg:76 mmHg, Min:51 mmHg, Max:106 mmHg    Pulse Min: 84  Max: 88   Temp: 37.4 C (99.3 F) (room is very warm.  thermostat turned down)  BP: 142/73 mmHg  Pulse: 84   Resp: 18   SpO2: 100 %  Flow (L/Min)(Oxygen Therapy): 2   Oxygen Concentration (%)(Therapy): (not recorded)  Weight: 75.1 kg (165 lb 9.1 oz)       Resp Min: 16  Max: 18   SpO2 Min: 98 % Max: 100 %  No Data Recorded     Intake and Output:  Current Shift:  In: 500 [Oral:500]  Out: 40 [Urine:40]   Intake and Output: Last Two Completed Shifts:  I/O Last 2 Completed Shifts:  In: 3200 [Oral:2200; Crystalloid:400; Blood:600]  Out: 2825 [Urine:2825]    PHYSICAL EXAM:  GENERAL APPEARANCE: Alert & Oriented, NAD, sitting in a chair   EYES:  No conjunctival  pallor or scleral icterus. Extraocular movements normal.    MOUTH:  Oral mucosa dry  CARDIOVASCULAR:  No edema of lower extremities. Jugular venous pressure not elevated. Regular rate. Normal heart sounds. No murmurs.  RESPIRATORY: clear anterior   GI:  Abdomen mildly distended, no pain   GU: Left lower quadrant surgical incision clean & dry. No tenderness over renal allograft.  MUSCULOSKELETAL: No joint swelling or deformities.  NERVOUS SYSTEM: No motor or sensory deficits. chills  PSYCH:  Apropriate affect.  Vascular access: LUE AVF     LAB TESTS/STUDIES:  BLOOD COUNTS   Recent labs for the past 48 hours     12/31/12 0500 12/30/12 0559    WHITE BLOOD CELL COUNT 6.6 5.4    RED CELL COUNT 3.29* 2.54*    HEMOGLOBIN 10.3* 8.1*    HEMATOCRIT 29.8* 23.4*    MCV 90.7 92.2    MCH 31.4 31.8    RDW 14.4 13.9    PLATELET COUNT 218 203    NUCLEATED RBC/100 WBC -- --    BC COMMENTS -- --      CHEM 20   Recent labs for the past 48 hours     12/31/12 0530 12/30/12 0500    SODIUM 137 137    POTASSIUM 3.2* 3.2*    CHLORIDE 107 107    CARBON DIOXIDE TOTAL 17* 18*    CREATININE BLOOD 7.68* 8.41*    UREA NITROGEN, BLOOD (BUN) 45* 49*    GLUCOSE 102* 184*    CALCIUM 8.0* 8.1*    PROTEIN 6.1* 5.9*    ALBUMIN 2.8* 2.8*    ALKALINE PHOSPHATASE (ALP) 53 57    ASPARTATE TRANSAMINASE (AST) 24 17    BILIRUBIN TOTAL 0.7 0.7    ALANINE TRANSFERASE (ALT) 19 14    TRIGLYCERIDE -- --    URIC ACID, BLOOD -- --    LACTATE DEHYDROGENASE -- --    PHOSPHORUS (PO4) 5.0 5.9*    CHOLESTEROL -- --    MAGNESIUM (MG) -- --        ASSESSMENT AND RECOMMENDATIONS:    1. Deceased donor renal transplant recipient, post-operative day # 9.  Has delayed renal allograft function required dialysis on 12/22/12 post transplant given hyperkalemia.  Do not anticipate he will need dialysis anymore as possible early recovery.  No need for potassium supplementation but can consider liberalizing his diet given recent low potassium trends.    2.  Febrile episodes   Resolved, now on amoxicillin for enterococcus coverage (donor + culture), culture obtained here are negative to date     3. Immunosuppression  Non-sensitized   He has completed his steroids and thymoglobulin.  Receiving MMF.Tacrolimus .    4. Hypertension  acceptable.  5. Diabetes mellitus  Will need sliding scale and teaching before his discharge.    6. Antimicrobial prophylaxis  Intermediate risk for CMV disease -taking oral valgancyclovir .  started on trimethoprim/sulfamethoxyzole on post-operative day #4 for PJP and urinary infection prophylaxis.     7. Anemia  Hgb low but stable, remains on epogen.    8. Nausea with vomiting  This has resolved, will likely titrate down reglan as an outpatient.    Case discussed with Dr. Sharlee Blew   Electronically signed by:  Freeman Caldron   Renal Fellow  Pager: [phone]  PI: [id number]       12/31/2012    I have seen and personally examined the patient.  I have reviewed the events of the past 24 hours and discussed the case with Deloris Ping, Nephrology Fellow.  I agree with the documented findings as well as the impression and plan or recommendations.     Binnie Rail Sharlee Blew, MD  PI [phone]  Attending, Professor of Medicine   Kidney Transplant Service

## 2012-12-31 NOTE — Discharge Instructions (Signed)
Warning Signs:    If you have any of the following symptoms, please call your transplant coordinator immediately:    Fever  Wound redness or drainage  Pain over your transplant kidney  Hand and feet swelling  Weight Gain (greater than 3 pounds in a day)  Decreased urine output  Bloody urine  Any difficulty taking your medication or obtaining your medication  Any general feeling of illness or weakness  Nausea and vomiting that won't stop and keeps you from eating      Follow Up:  Start having your labs drawn twice weekly on Mondays and Thursdays as instructed by your transplant coordinator.    You have a follow up appointment at the transplant center, please see the follow up section of this sheet for the date and time.    Wound Care:  You may leave your wound open to air.  You may shower but keep your back to the stream of water.  Don't soak in a tub or pool until your wound is fully healed.    Foley Catheter:  You will be going home with a Foley catheter.  You must empty it as instructed by your nurse.  Keep track of the amount emptied in your transplant diary. We will remove the Foley in the clinic.

## 2012-12-31 NOTE — Discharge Planning (AHS/AVS) (Signed)
NOVOLOG INSULIN SLIDING SCALE    Check blood sugar level before each meal  Give Novolog ONLY after eating at least 50% of meal  Do NOT use if NOT eating    BLOOD SUGAR UNITS OF Novolog INSULIN  Inject subcutaneously   0-80 Apple juice/carbs and recheck in 30 min   81 - 110    111 - 150 2 units   151 - 200 4 units   201-250 6 units   251-300 8 units   301-350 10 units   351-400 12 units   Greater than 401 14 units and call coordinator     Novolog insulin = SHORT ACTING pre-meal insulin  Lantus insulin = LONG ACTING once daily insulin    Do NOT mix Novolog insulin with Lantus insulin    No need to refrigerate after open; Good for 28 days at room temperature

## 2012-12-31 NOTE — Discharge Summary (Signed)
INPATIENT DISCHARGE SUMMARY  ADMISSION DATE:  12/21/2012   DISCHARGE DATE:  12/31/2012    ADMISSION DIAGNOSIS:   Chronic Kidney Disease    DISCHARGE DIAGNOSIS:  Chronic Kidney Disease, s/p Deceased Donor Renal Transplantation Delayed Graft Function.    PROCEDURES:    1. Deceased Donor Renal Transplantation (Dr. Rise Patience)    HISTORY OF PRESENT ILLNESS:     Charles Galvan is a 47yr old male with chronic kidney disease stage V secondary to diabetic nephropathy. The patient was placed on the [facility] kidney transplant list and was called into the hospital when a suitable En Bloc donor was located for him. He underwent deceased-donor renal transplantation on 12/22/2012.     HOSPITAL COURSE:     1. Graft:     Delayed Graft Function   The patient had poor urine output post operatively.  Dialysis was required on POD 0 for hyperkalemia.  It is not anticipated that he will need dialysis as an outpatient.  The patient's discharge creatinine level was 7.68 mg/dL.     2. Immunosuppression. The patient is low immunologic risk with a PRA of 0%. The patient was given 5 doses of Thymoglobulin.  The patient's total Thymoglobulin dose was 7.6 mg/kg.  The patient received 3 doses of solumedrol.  The patient was not sent home on a long steroid taper.  The patient was started on mycophenolate preoperatively; the mycophenolate dose was advanced to 500 mg PO TID by discharge.  Tacrolimus was started on postop day 2. The patient's discharge dose of tacrolimus was 9 mg p.o. b.i.d.    3.  Prophylaxis. The patient is intermediate risk for CMV disease. The patient is CMV positive, and the donor is CMV negative. The patient was placed on IV ganciclovir for the duration of Thymoglobulin therapy and was transitioned to p.o. Valcyte when the Thymoglobulin course was complete.  The patient was also placed on Bactrim single strength p.o. daily for PJP prophylaxis.     4. Genitourinary. The patient underwent placement of two transplant  ureteral stents during transplantation, which will be removed four to six weeks from the date of transplantation.  The patient was discharged with a foley catheter. The indication for discharge with a foley was hematuria with clots.  The Foley catheter can be removed in clinic if there are no clots.       5. Cardiovascular.  The patient has a history of hypertension and was continued on Metoprolol 50 mg BID.      6. Endocrine:  The patient has a history of Diabetes Mellitus. The patient was started on an insulin sliding scale post operatively and was sent home on a sliding scale insulin plan. In clinic     7. ID:  The patient's donor had urine cultures positive for Enterococcus Faecalis . The patient will continue treatment on an outpatient basis with oral Amoxicillin for a total course of four weeks. Antibiotic therapy will be complete on 01/19/2013.     8. GI: The patient did have complaints of nausea and poor oral intake. Reglan was added to his medication regimen and his Mycophenolate dose was changed to 500 mg TID instead of 750 mg BID, this has greatly improved his nausea and is now with improving oral intake.    CONSULTATIONS:     1. Transplant Nephrology.    PATIENT INSTRUCTIONS:    The patient was instructed regarding transplant medications, wound care, lab follow up, clinic follow up, and warning signs  for which to call the transplant coordinator.    FOLLOWUP:    1. The patient has a follow up appointment in the Transplant Clinic on Tuesday January 06, 2013.       Golden, [place]   Home Medication Instructions [id number]    Printed on:12/31/12 1716   Medication Information                      Amoxicillin (AMOXIL) 500 mg Capsule  Take 1 capsule by mouth 2 times daily. Through 01/19/13             Aspirin 81 mg DR Tablet EC Tablet  Take 1 tablet by mouth every morning.             Blood Sugar Diagnostic (ACCU-CHEK AVIVA PLUS TEST STRP) Strips  Use to test blood glucose 4 times daily              Cholecalciferol (VITAMIN D3) 1,000 unit Tablet  Take 1 tablet by mouth every day.             Docusate (COLACE) 100 mg Capsule  Take 1 capsule by mouth 2 times daily.             Epoetin (EPOGEN, PROCRIT) 10,000 unit/mL Injection  Inject 1 mL subcutaneously one time each week. Indications: Anemia due to Renal Failure             FamoTIDine (PEPCID) 20 mg Tablet Tablet  Take 1 tablet by mouth 2 times daily.             Hydrocodone 5 mg/Acetaminophen 325 mg (NORCO  5) 5-325 mg per tablet  Take 1-2 tablets by mouth every 4 hours if needed (pain).             Insulin Aspart (NOVOLOG FLEXPEN) 100 unit/mL Pen   Inject 3 times a day WITH MEALS < 70, drink juice, 70-130,0 units;131-150,1 unit;151-180,2 units; 181-210,3 units; 211-240,4 units; FSBG 241-270, 5 units; FSBG 271-300,6 units; 301-330, 7 units; 331-360,8 units; 361-400,10 units;> 400,12 units.             Insulin Pen Needles, Disposable, (BD ULTRA-FINE III PEN NEEDLE) 31 x 3/16 "  1 each three to four times daily or as directed. for insulin administration via insulin pen             Insulin Syringe-Needle U-100 1 mL 30 x 1/2"  Use to administer Procrit injection once weekly as directed             Lancets (ACCU-CHEK SOFTCLIX LANCETS)  Use to test blood glucose 4 times daily             Metoclopramide (REGLAN) 5 mg Tablet  Take 1 tablet by mouth 4 times daily. 30 minutes before each meal             Metoprolol Tartrate (LOPRESSOR) 50 mg Tablet  Take 1 tablet by mouth 2 times daily. Check BP if you are dizzy or light-headed. Call if your SBP > 180 or < 100, or your HR < 50             Multivitamin (HEXAVITAMIN) Tablet  Take 1 tablet by mouth every morning.             Mycophenolate (MMF) 250 mg Capsule  Take 2 capsules by mouth 3 times daily. Or as directed. Do NOT take calcium or antacids within 2 hours of any dose. (SANDOZ ONLY; DO NOT  CHANGE MFG FOR REFILLS) +++ ICD 9: V 42.0 +++             Pravastatin (PRAVACHOL) 20 mg Tablet  Take 1 tablet by mouth every  morning.             Tacrolimus (PROGRAF) 1 mg Capsule  Take 9 capsules by mouth 2 times daily. Or as directed by MD. Take doses every 12 hours. Take morning dose AFTER blood draw on lab days. (SANDOZ ONLY; DO NOT CHANGE MFG FOR REFILLS) +++ ICD 9: V 42.0 +++             Trimethoprim 80 mg/Sulfamethoxazole 400 mg (BACTRIM SS) 400-80 mg Tablet  Take 1 tablet by mouth every morning.             valGANciclovir (VALCYTE) 450 mg Tablet  Take 1 tablet by mouth every Monday and Friday.

## 2012-12-31 NOTE — Nurse Focus (Signed)
Reviewed Novolog insulin administration instructions with patient and had patient administer his own Novolog. He required a lot of re-enforcement, but is now able to set up and administer Novolog sub Q independently. Wife still not at bedside. Pt likely to be discharged home today.  Pt states wife should be here soon for education. Alastair Hennes, Charity fundraiser

## 2012-12-31 NOTE — Progress Notes (Signed)
Final Donor Cultures:    Blood: negative  Urine: Enterococcus faecalis  Plan to treat with Ampicillin x 4 weeks ending 01/19/2013

## 2012-12-31 NOTE — Nurse Assessment (Signed)
ASSESSMENT NOTE    Note Started: 12/31/2012, 07:28     Initial assessment completed and recorded in EMR.  Report received from night shift nurse and orders reviewed. Plan of Care reviewed and appropriate, discussed with patient. 2 units PRBCs received, H/H has responded appropriately. Pt reports he feels less fatigued after transfusion.  NOC shift RN reports he flushed foley once last night, able to withdraw some small clots with pink hematuria. Clots are smaller and less frequent, urine is more clear red/dark pink as opposed to yesterday when it was more dark red.  Continue with plan of care. Sherese Heyward, Charity fundraiser

## 2012-12-31 NOTE — Progress Notes (Addendum)
Transplant Surgery Daily Progress Note        Date of Service: 12/31/2012        ID: 47yr old male with ESRD due to diabetic nephropathy s/p Peds-en-bloc DDRT    POD: 10    24 Hour Events:  - Bladder discomfort yesterday  - Continued clots in foley throughout the day, required intermittent flushing.  Foley teaching completed including flushing  - Transfused 2 unit(s) PRBCs, Hgb with appropriate rise 8 -> 10    Vital Signs   Temp src:  [-]   Temp:  [36.7 C (98.1 F)-37.4 C (99.3 F)]   Pulse:  [75-88]   BP: (136-165)/(62-81)   Resp:  [16-18]   SpO2:  [98 %-100 %]   Weight: 75.1 kg (165 lb 9.1 oz) (12/31/12 0549)   Admission Weight: 75.2 kg    I/O:  Intake/Output Summary (Last 24 hours) at 12/31/12 0653  Last data filed at 12/31/12 0549   Gross per 24 hour   Intake   3200 ml   Output   2825 ml   Net    375 ml   UOP 2.83 L, 117 cc/hr    PHYSICAL EXAM  General: awake, alert, NAD  HEENT: sclera clear, EOMI  Resp: breathing easily  CV: normal rate, regular rhythm  GI: soft, moderately distended, wound intact, no erythema  GU: Foley draining cranberry colored urine    Labs:  Lab Results   Lab Name Value Date/Time    NA 137 12/31/2012  5:30 AM    K 3.2* 12/31/2012  5:30 AM    CL 107 12/31/2012  5:30 AM    CO2 17* 12/31/2012  5:30 AM    BUN 45* 12/31/2012  5:30 AM    CR 7.68* 12/31/2012  5:30 AM    GLU 102* 12/31/2012  5:30 AM     Lab Results   Lab Name Value Date/Time    WBC 6.6 12/31/2012  5:00 AM    HGB 10.3* 12/31/2012  5:00 AM    HCT 29.8* 12/31/2012  5:00 AM    PLT 218 12/31/2012  5:00 AM     Lab Results   Lab Name Value Date/Time    TACROLIMUS 11.1 12/30/2012  5:00 AM     Blood culture (12/25/12): NGTD  Urine culture (12/26/12): negative      ASSESSMENT/PLAN:  38yr year old male s/p Peds-en-bloc Deceased Donor Renal Transplant    Graft: Delayed Graft Function.    - Hemodialysis necessary for hyperkalemia after transplantation on POD 1, again on POD 4  - Reassess need for dialysis daily  - Continue to monitor    Immunosuppression:     Low Immunologic Risk: PRA 0%;   - Thymoglobulin 1.5 mg/kg for 5 total doses, complete  - Solumedrol 3/3 doses completed  - Cellcept 500 mg PO TID  - Prograf 8 mg po bid, will adjust dose per level    Prophylaxis:    Low Risk CMV (CMV Donor negative, CMV Recipient positive)  - Valcyte and Bactrim    F/E/N: Hypervolemic, stable.  - SLIV  - Renal, diabetic diet    CV: Hypertension better controlled.  - Continue Metoprolol to 50 mg BID  - Continue ASA, and Pravstatin    Pulm: No desaturations.      - Continue IS, OOB  - Consider consulting pulmonary or follow up on an outpatient basis for PFTs.        GU: Foley removed, replaced for urine retention.  -  Continued hematuria    Endocrine: Diabetes Mellitus Type II, POC Glucose, blood:  [811 mg/dl-158 mg/dl] , better controlled.    - ISS  - Insulin teaching completed    Neuro: Pain well controlled on Oxycodone 5 mg.    Heme: CBC stable.  Will continue to monitor.    ID: No fever, no leukocytosis      - Amoxicillin x4 weeks for donor enterococcus faecalis UTI (amox four weeks).      GI: Nausea with abdominal distention, unchanged  - Continue Reglan scheduled QID    Dispo: Potential discharge home today with foley    Electronically Signed by:  Billy Coast, MD  General Surgery, PGY1  Transplant Service Pager: [phone]      DATE OF SERVICE:   12/31/2012  I reviewed this patient's medical record, obtained the history from the patient, reviewed all pertinent available imaging studies, did a physical examination, saw the patient with Dr. Ellene Route and agree with her findings, documentation and treatment plan we developed together.   Electronically signed by: Faythe Ghee, MD, FACS      PI# [phone]  Attending Surgeon; Department of Surgery, Division of Transplantation  [facility]

## 2012-12-31 NOTE — Nurse Focus (Addendum)
Pt reported feeling bladder pressure. Foley flushed several times with 20 ml NS each times. Several small clots withdrawn. Hematuria clearing. Ramirez Fullbright, Charity fundraiser

## 2012-12-31 NOTE — Nurse Focus (Signed)
Foley flushed again for pt reports of bladder pressure.  Removed several small clots and some larger clots. Urine still clear pink.  Re-enforced foley irrigation education with patient. Pt's wife not yet at bedside.  Elexus Barman, Charity fundraiser

## 2012-12-31 NOTE — Nurse Focus (Signed)
RN Discharge Note:    Pt deemed stable for discharge to home.  AHS and discharge instructions provided to patient and wife.  Medication changes and medication schedule reviewed by transplant PharmD Alcario Drought.  Education on foley/leg bag care at home completed with pt and pt's wife. Understanding of all discharge instructions stated.    Chaden Doom, Charity fundraiser

## 2013-01-01 MED FILL — ceFAZolin 500 mg solution for injection: INTRAMUSCULAR | Qty: 1000 | Status: AC

## 2013-01-01 MED FILL — bupivacaine (PF) 0.25 % (2.5 mg/mL) injection solution: INTRAMUSCULAR | Qty: 30 | Status: AC

## 2013-01-01 MED FILL — heparin (porcine) 5,000 unit/500 mL(10 unit/mL) in 0.9 % NaCl IV soln: INTRAVENOUS | Qty: 500 | Status: AC

## 2013-01-01 MED FILL — Medication: Qty: 1 | Status: AC

## 2013-01-01 MED FILL — sodium chloride 0.9 % irrigation solution: Qty: 1000 | Status: AC

## 2013-01-01 MED FILL — sodium chloride 0.9 % intravenous solution: INTRAVENOUS | Qty: 1000 | Status: AC

## 2013-01-01 MED FILL — gentamicin 40 mg/mL injection solution: INTRAMUSCULAR | Qty: 2 | Status: AC

## 2013-01-01 MED FILL — cellulose, oxidized 4" X 8" pads: Qty: 2 | Status: AC

## 2013-01-02 ENCOUNTER — Telehealth: Payer: Self-pay | Admitting: Specialist

## 2013-01-02 ENCOUNTER — Encounter: Payer: Self-pay | Admitting: Specialist

## 2013-01-02 MED ORDER — TACROLIMUS 1 MG CAPSULE, IMMEDIATE-RELEASE
8.0000 mg | ORAL_CAPSULE | Freq: Two times a day (BID) | ORAL | 6 refills | Status: DC
Start: 2013-01-02 — End: 2013-01-28
  Filled 2013-01-13 – 2013-01-20 (×2): qty 480, 30d supply, fill #0

## 2013-01-02 NOTE — Telephone Encounter (Signed)
Detailed message left on patients mobile and his wife's mobile VM.  -----  Notes Recorded by Marline Backbone, MD on 01/02/2013 at 2:25 PM  Drop to 8 BId  Muna  ------    Notes Recorded by Virgina Jock, RN on 01/02/2013 at 2:25 PM  FYI-  He had been receiving 10mg  bid while on T8, level came back at 14.9 and dose was reduced at time of discharge to 9mg  bid. He would have only had one dose (wed PM) prior to this lab draw.  ------    Notes Recorded by Virgina Jock, RN on 01/02/2013 at 2:22 PM  FK level at 14.9 with dose of 9mg  bid.

## 2013-01-06 ENCOUNTER — Ambulatory Visit: Admitting: Specialist

## 2013-01-06 ENCOUNTER — Ambulatory Visit

## 2013-01-06 ENCOUNTER — Encounter: Payer: Self-pay | Admitting: Specialist

## 2013-01-06 ENCOUNTER — Ambulatory Visit: Payer: MEDICARE

## 2013-01-06 VITALS — BP 152/59 | HR 85 | Temp 99.7°F | Wt 159.4 lb

## 2013-01-06 NOTE — Nursing Note (Signed)
>>   Virgina Jock, RN     Thu Jan 15, 2013  3:42 PM  Patient foley removed per MD order. Post void residual with bladder scan shows to be <57ml X 2 separate readings. Patient instructed to double void to develop bladder strength and control.

## 2013-01-06 NOTE — Progress Notes (Deleted)
POST TRANSPLANT NUTRITION EVALUATION  01/06/2013    Nutrition Assessment     Charles Galvan is a 47yr old male who is status post peds en bloc on 12/22/2012, and was discharged from the hospital on 12/31/2012 with DGF.    Pertinent Medical History:   Client Medical History: DM2, HTN, Dyslipidemia, CKD5. Pre-Tx Dialysis: HD    Pertinent Labs:   Lab Results   Lab Name Value Date/Time    CR 3.98 01/05/2013 9:08 AM    BUN 52 01/05/2013 9:08 AM    GFRNAA 8* 12/31/2012 5:30 AM    GFRAA 9* 12/31/2012 5:30 AM    K 4.5 01/05/2013 9:08 AM    PO4 4.8 01/05/2013 9:08 AM    MG 2.0 01/05/2013 9:08 AM    ALB 4.1 01/05/2013 9:08 AM     Self Monitored Blood Glucose:   03/17 98-189  03/16: 121-241  03/15: 137-154      Pertinent Meds: Tacrolimus, Mycophenolate, Lipitor, Multivitamin, Colace, Novolog Insulin correction dose with meals, Reglan, Vitamin D3    Nutrition Focused Physical Findings:  Digestive Systems: regular BMs    Anthropometrics:  Estimated body mass index is 28.24 kg/(m^2) as calculated from the following:   Height as of 12/21/12: 1.6 m (5\' 3" ).   Weight as of an earlier encounter on 01/06/13: 72.3 kg (159 lb 6.3 oz).  Pre-transplant admission weight : 75.2 kg (3/2)   Weight History: -2.9 kg (3.9% of body weight) over the past 2 weeks.    Wt Readings from Last 6 Encounters:   01/06/13 72.3 kg (159 lb 6.3 oz)   12/31/12 75.1 kg (165 lb 9.1 oz)   07/17/12 76.9 kg (169 lb 8.5 oz)       Food & Nutrition Related History:   Previously prescribed diet: Renal, Diabetic, Low Sodium, Fluid restriction diet  Previous nutrition education/counseling: post-transplant education with inpatient RD.  Nutritional/Herbal Supplements: none noted    Diet Recall:   Meal/Snack pattern: 3 meals/day and snacks  Breakfast: eggs, toast, oatmeal  Lunch: chicken, rice, mixed frozen vegetables (cauliflower, broccoli, carrots)  Dinner: fish, shrimp, salmon, rice, vegetables (see above)  Snacks:  jello, 1/2 banana, 1/2 [place], ice cream  Beverages: ~2400+ mL water daily, diet Cola     Nutrition Diagnosis     1. Excessive carbohydrate intake related to limited adherence to diabetic diet as evidenced by diet recall and elevated finger stick glucose levels over the past 3 days. NI-5.8.2    Summary of Assessment:  Patient reports a good appetite post transplant without nausea and regular BMs. Average finger stick glucose levels were elevated. Advised patient to avoid ice cream based on diet recall and continue to adhere to a diabetic diet. Potassium and phosphorus are within normal limits, though remain on the upper side. Advised patient to limit high potassium and phosphorus foods for now.    Nutrition Intervention     1. Nutrition Education:    FOOD & WATER SAFETY: Reviewed proper hand hygiene, food preparation, and food storage. Discussed not eating out first 6 months.   FOOD-DRUG INTERACTIONS: Mandarin, pomegranate, and grapefruit need to be avoided. Avoid calcium rich foods two hours before & after taking Mycophenolate.    RENAL DIET: Discussed post-transplant changes to protein, sodium, potassium, phosphorus, and fluid needs. Advised to continue to limit sodium. Avoid high potassium foods such as banana and [place] for now.    DIABETES: Discussed post-transplant risk for hyperglycemia, and emphasized the importance of long term diabetes management.  Avoid ice cream, concentrated sweets, and controlling portions of high carbohydrate foods such as tortillas.   Education needs identified: Yes  Barriers to learning identified: No  Patient verbalizes understanding of teaching instructions: Yes  Patient agreed to recommendations: Yes  Handouts provided: None    Nutrition Monitoring & Evaluation     1. Glucose/Endocrine Profile: Maintain glycemic control <180    2. Electrolyte Profile: Maintain phosphorus and potassium  WNL      01/06/2013   Ledora Bottcher Dietetic Intern, pager x [phone]

## 2013-01-06 NOTE — Progress Notes (Deleted)
POST TRANSPLANT NUTRITION EVALUATION  01/06/2013    Nutrition Assessment     Charles Galvan is a 46yr old male who is status post peds en bloc on 12/22/2012, and was discharged from the hospital on 12/31/2012 with DGF.    Pertinent Medical History:   Client Medical History: DM2, HTN, Dyslipidemia, CKD5. Pre-Tx Dialysis: HD    Pertinent Labs:   Lab Results   Lab Name Value Date/Time    CR 3.98 01/05/2013 9:08 AM    BUN 52 01/05/2013 9:08 AM    GFRNAA 8* 12/31/2012 5:30 AM    GFRAA 9* 12/31/2012 5:30 AM    K 4.5 01/05/2013 9:08 AM    PO4 4.8 01/05/2013 9:08 AM    MG 2.0 01/05/2013 9:08 AM    ALB 4.1 01/05/2013 9:08 AM     Self Monitored Blood Glucose:   03/17 98-189  03/16: 121-241  03/15: 137-154      Pertinent Meds: Tacrolimus, Mycophenolate, Lipitor, Multivitamin, Colace, Novolog Insulin correction dose with meals, Reglan, Vitamin D3    Nutrition Focused Physical Findings:  Digestive Systems: regular BMs    Anthropometrics:  Estimated body mass index is 28.24 kg/(m^2) as calculated from the following:   Height as of 12/21/12: 1.6 m (5' 3").   Weight as of an earlier encounter on 01/06/13: 72.3 kg (159 lb 6.3 oz).  Pre-transplant admission weight : 75.2 kg (3/2)   Weight History: -2.9 kg (3.9% of body weight) over the past 2 weeks.    Wt Readings from Last 6 Encounters:   01/06/13 72.3 kg (159 lb 6.3 oz)   12/31/12 75.1 kg (165 lb 9.1 oz)   07/17/12 76.9 kg (169 lb 8.5 oz)       Food & Nutrition Related History:   Previously prescribed diet: Renal, Diabetic, Low Sodium, Fluid restriction diet  Previous nutrition education/counseling: post-transplant education with inpatient RD.  Nutritional/Herbal Supplements: none noted    Diet Recall:   Meal/Snack pattern: 3 meals/day and snacks  Breakfast: eggs, toast, oatmeal  Lunch: chicken, rice, mixed frozen vegetables (cauliflower, broccoli, carrots)  Dinner: fish, shrimp, salmon, rice, vegetables (see above)  Snacks:  jello, 1/2 banana, 1/2 [place], ice cream  Beverages: ~2400+ mL water daily, diet Cola     Nutrition Diagnosis     1. Excessive carbohydrate intake related to limited adherence to diabetic diet as evidenced by diet recall and elevated finger stick glucose levels over the past 3 days. NI-5.8.2    Summary of Assessment:  Patient reports a good appetite post transplant without nausea and regular BMs. Average finger stick glucose levels were elevated. Advised patient to avoid ice cream based on diet recall and continue to adhere to a diabetic diet. Potassium and phosphorus are within normal limits, though remain on the upper side. Advised patient to limit high potassium and phosphorus foods for now.    Nutrition Intervention     1. Nutrition Education:    FOOD & WATER SAFETY: Reviewed proper hand hygiene, food preparation, and food storage. Discussed not eating out first 6 months.   FOOD-DRUG INTERACTIONS: Mandarin, pomegranate, and grapefruit need to be avoided. Avoid calcium rich foods two hours before & after taking Mycophenolate.    RENAL DIET: Discussed post-transplant changes to protein, sodium, potassium, phosphorus, and fluid needs. Advised to continue to limit sodium. Avoid high potassium foods such as banana and [place] for now.    DIABETES: Discussed post-transplant risk for hyperglycemia, and emphasized the importance of long term diabetes management.   Avoid ice cream, concentrated sweets, and controlling portions of high carbohydrate foods such as tortillas.   Education needs identified: Yes  Barriers to learning identified: No  Patient verbalizes understanding of teaching instructions: Yes  Patient agreed to recommendations: Yes  Handouts provided: None    Nutrition Monitoring & Evaluation     1. Glucose/Endocrine Profile: Maintain glycemic control <180    2. Electrolyte Profile: Maintain phosphorus and potassium  WNL      01/06/2013   Jazlen Ogarro Dietetic Intern, pager x [phone]

## 2013-01-06 NOTE — Progress Notes (Signed)
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 Received (en-bloc kidneys from a pediatric donor, 27 months male with terminal creatinine of 2.6) on 12/22/12  He is low immunologic risk and intermediate risk for CMV infection     Rejection Episodes: No  BK Screening: Too soon   Pertinent Infections: On amoxil 500 mg BID till 01/19/2013 for donor related infection      SUBJECTIVE:  Since being DC from the hospital he is doing well, still with Foley in place after failing a trial of catheter removal in the hospital, no fever good appetitegood UOP     REVIEW OF SYSTEMS:  Constitutional: Generally feels well  Eyes: The patient denies changes in visual acuity, diplopia or amaurosis, no discharge, matting, redness, tearing or eye pain.  Ears, Nose, Mouth, Throat: negative for hearing problems, no complains of chronic sinus problems.  No mouth or dental compliants.   CV: No complaints of palpitations, syncope, chest pain, paroxysmal nocturnal dyspnea, orthopnea, cyanosis, or claudication.  Resp: No complaints of cough, hemoptysis, shortness of breath, pleuritic pain, wheezing, dyspnea on exertion  GI: No complaints of vomiting, dysphagia, nausea, heartburn or reflux, early satiety, abdominal pain, bleeding from rectum, constipation, diarrhea.  GU: making urine. No complaints of dysuria, or hematuria.  Musculoskeletal: No complaints of joint or muscle pain, swelling, or  tenderness .  Integumentary: Negative for moles that have changed, rash, itching, bruising, lumps or bumps.  Neuro: No complaints of headaches, seizures, no tremors   Psych: No complaints of depressed mood, insomnia, or anxiety.  Endo:No complaints of cold intolerance, heat intolerance.  Heme/Lymphatic:No complaints of bleeding disorder, abnormal bleeding, abnormal bruising.  Allergy/Immun: No complaints of hay fever, itchy eyes.      MEDICATIONS:  Prograf 8 mg BID  Cellcept 500 mg TID       FAMILY HISTORY:  Reviewed and noncontributory.    SOCIAL HISTORY:  History     Social History    Marital Status: MARRIED     Spouse Name: N/A     Number of Children: N/A    Years of Education: N/A     Occupational History    Not on file.     Social History Main Topics    Smoking status: Never Smoker     Smokeless tobacco: Never Used    Alcohol Use: No    Drug Use: No    Sexually Active: Not on file     Other Topics Concern    Not on file     Social History Narrative    No narrative on file       Reviewed and noncontributory.    PHYSICAL EXAMINATION:  BP 152/59  Pulse 85  Temp(Src) 37.6 C (99.7 F) (Core)  Wt 72.3 kg (159 lb 6.3 oz)  BMI 28.24 kg/m2  Wt  Readings from Last 4 Encounters:   01/06/13 72.3 kg (159 lb 6.3 oz)   12/31/12 75.1 kg (165 lb 9.1 oz)   07/17/12 76.9 kg (169 lb 8.5 oz)       Physical Exam:General Appearance: not pale or jaundiced   Eyes: WNL  ENT: WNL  Neck: supple. No adenopathy, thyroid symmetric, normal size.   Cardiovascular: rate and regular rhythm, no murmurs, clicks, or gallops   Respiratory: clear to auscultation, no crackles or wheezes   Abdomen: soft, non-tender. No masses or organomegaly.   GU: The renal allograft in the left iliac fossa  with no tenderness no bruits intact incision   Extremities: no edema  Musculoskeletal: no arthritis   Skin No rashes or lesions.  Neuro: no tremors         LABORATORY DATA:  SODIUM   Date Value Range Status   01/05/2013 139   Final    01/01/2013 138   Final   12/31/2012 137  135-145 mEq/L Final     POTASSIUM   Date Value Range Status   01/05/2013 4.5   Final   01/01/2013 3.7   Final   12/31/2012 3.2* 3.3-5.0 mEq/L Final     CHLORIDE   Date Value Range Status   01/05/2013 111   Final   01/01/2013 107   Final   12/31/2012 107  95-110 mEq/L Final     CARBON DIOXIDE TOTAL   Date Value Range Status   01/05/2013 15   Final   01/01/2013 18   Final   12/31/2012 17* 24-32 mEq/L Final     UREA NITROGEN, BLOOD (BUN)   Date Value Range Status   01/05/2013 52   Final   01/01/2013 44   Final   12/31/2012 45* 8-22 mg/dL Final     CREATININE BLOOD   Date Value Range Status   01/05/2013 3.98   Final   01/01/2013 6.97   Final   12/31/2012 7.68* 0.44-1.27 mg/dL Final     CALCIUM   Date Value Range Status   01/05/2013 8.9   Final   01/01/2013 8.5   Final   12/31/2012 8.0* 8.6-10.5 mg/dL Final     GLUCOSE   Date Value Range Status   01/05/2013 144   Final   01/01/2013 130   Final   12/31/2012 102* 70-99 mg/dL Final      FASTING:                          NON-FASTING:      100-125 Impaired fasting glucose  >199 Diabetes Mellitus      >=126   Diabetes Mellitus              .      The reference interval is based on a fasting patient.   12/30/2012 184* 70-99 mg/dL Final      FASTING:                          NON-FASTING:      100-125 Impaired fasting glucose  >199 Diabetes Mellitus      >=126   Diabetes Mellitus              .      The reference interval is based on a fasting patient.   12/29/2012 104* 70-99 mg/dL Final      FASTING:  NON-FASTING:      100-125 Impaired fasting glucose  >199 Diabetes Mellitus      >=126   Diabetes Mellitus              .      The reference interval is based on a fasting patient.   12/28/2012 145* 70-99 mg/dL Final      FASTING:                          NON-FASTING:      100-125 Impaired fasting glucose  >199 Diabetes Mellitus      >=126   Diabetes Mellitus              .      The reference interval is based on a fasting patient.      No components found with this basename: phosphorus     No components found with this basename: magnesium     ASPARTATE TRANSAMINASE (AST)   Date Value Range Status   01/05/2013 12   Final   01/01/2013 24   Final   12/31/2012 24  15-43 U/L Final       No results found for this basename: Chol, Trig, HDL     TACROLIMUS (FK506)   Date Value Range Status   01/05/2013 pend   Final   01/01/2013 14.9   Corrected   12/31/2012 14.9  5-15 ng/mL Final             Many factors influence the determination of appropriate      tacrolimus blood levels at any individual transplant center      and for any single patient. Concentrations above 15 ng/mL      are associated with an increased incidence of adverse      effects.      METHODOLOGY: Abbott ARCHITECT i1000 Chemiluminescent      Microparticle Immunoassay (CMIA)   12/30/2012 11.1  5-15 ng/mL Final             Many factors influence the determination of appropriate      tacrolimus blood levels at any individual transplant center      and for any single patient. Concentrations above 15 ng/mL      are associated with an increased incidence of adverse      effects.      METHODOLOGY: Abbott ARCHITECT i1000 Chemiluminescent      Microparticle Immunoassay (CMIA)       WHITE BLOOD CELL COUNT   Date Value Range Status   01/05/2013 8.2   Final   01/01/2013 8.2   Final   12/31/2012 6.6  4.5-11.0 K/MM3 Final        HEMOGLOBIN   Date Value Range Status   01/05/2013 11.7   Final   01/01/2013 11.3   Final   12/31/2012 10.3* 14.0-18.0 GM/DL Final        HEMATOCRIT   Date Value Range Status   01/05/2013 35.2   Final   01/01/2013 34.1   Final   12/31/2012 29.8* 40-52 % Final        PLATELET COUNT   Date Value Range Status   01/05/2013 325   Final   01/01/2013 293   Final   12/31/2012 218  130-400 K/MM3 Final         IMPRESSION AND PLAN:    1.  S/P Renal Transplant:  Renal function is improving no need for dialysis  2.  Immunosuppression.  Current immunsuppression is prograf and cellcept     3.  Hypertension.   controlled    4.  Hematologic.  Cell counts are stable.    5.  Electrolytes are within acceptable limits.    6.  Infectious disease prophylaxis.  On bactrim and valcyte     7. DC foley check PVR    8. Glucose: acceptable 150-200    The patient was counseled about the above findings and recommendations and understood the discussion. Fortino Sic will return to clinic in on week   and will have laboratory tests twice weekly     [facility]  Transplant Nephrology  [facility]  PI [phone]  Pager [phone]

## 2013-01-06 NOTE — Progress Notes (Signed)
POST TRANSPLANT NUTRITION EVALUATION  01/06/2013    Nutrition Assessment     Charles Galvan is a 47yr old male who is status post peds en bloc on 12/22/2012, and was discharged from the hospital on 12/31/2012 with DGF.    Pertinent Medical History:   Client Medical History: DM2, HTN, Dyslipidemia, CKD5. Pre-Tx Dialysis: HD    Pertinent Labs:   Lab Results   Lab Name Value Date/Time    CR 3.98 01/05/2013 9:08 AM    BUN 52 01/05/2013 9:08 AM    GFRNAA 8* 12/31/2012 5:30 AM    GFRAA 9* 12/31/2012 5:30 AM    K 4.5 01/05/2013 9:08 AM    PO4 4.8 01/05/2013 9:08 AM    MG 2.0 01/05/2013 9:08 AM    ALB 4.1 01/05/2013 9:08 AM     Self Monitored Blood Glucose:   03/17 98-189  03/16: 121-241  03/15: 137-154      Pertinent Meds: Tacrolimus, Mycophenolate, Lipitor, Multivitamin, Colace, Novolog Insulin correction dose with meals, Reglan, Vitamin D3    Nutrition Focused Physical Findings:  Digestive Systems: regular BMs    Anthropometrics:  Estimated body mass index is 28.24 kg/(m^2) as calculated from the following:   Height as of 12/21/12: 1.6 m (5\' 3" ).   Weight as of an earlier encounter on 01/06/13: 72.3 kg (159 lb 6.3 oz).  Pre-transplant admission weight : 75.2 kg (3/2)   Weight History: -2.9 kg (3.9% of body weight) over the past 2 weeks.    Wt Readings from Last 6 Encounters:   01/06/13 72.3 kg (159 lb 6.3 oz)   12/31/12 75.1 kg (165 lb 9.1 oz)   07/17/12 76.9 kg (169 lb 8.5 oz)       Food & Nutrition Related History:   Previously prescribed diet: Renal, Diabetic, Low Sodium, Fluid restriction diet  Previous nutrition education/counseling: post-transplant education with inpatient RD.  Nutritional/Herbal Supplements: none noted    Diet Recall:   Meal/Snack pattern: 3 meals/day and snacks  Breakfast: eggs, toast, oatmeal  Lunch: chicken, rice, mixed frozen vegetables (cauliflower, broccoli, carrots)  Dinner: fish, shrimp,  salmon, rice, vegetables (see above)  Snacks: jello, 1/2 banana, 1/2 [place], ice cream  Beverages: ~2400+ mL water daily, diet Cola     Nutrition Diagnosis     1. Excessive carbohydrate intake related to limited adherence to diabetic diet as evidenced by diet recall and elevated finger stick glucose levels over the past 3 days. [id number]    Summary of Assessment:  Patient reports a good appetite post transplant without nausea and regular BMs. Average finger stick glucose levels were elevated. Advised patient to avoid ice cream based on diet recall and continue to adhere to a diabetic diet. Potassium and phosphorus are within normal limits, though remain on the upper side. Advised patient to limit high potassium and phosphorus foods for now.    Nutrition Intervention     1. Nutrition Education:    FOOD & WATER SAFETY: Reviewed proper hand hygiene, food preparation, and food storage. Discussed not eating out first 6 months.   FOOD-DRUG INTERACTIONS: Mandarin, pomegranate, and grapefruit need to be avoided. Avoid calcium rich foods two hours before & after taking Mycophenolate.    RENAL DIET: Discussed post-transplant changes to protein, sodium, potassium, phosphorus, and fluid needs. Advised to continue to limit sodium. Avoid high potassium foods such as banana and [place] for now.    DIABETES: Discussed post-transplant risk for hyperglycemia, and emphasized the importance of long term diabetes management.  Avoid ice cream, concentrated sweets, and controlling portions of high carbohydrate foods such as tortillas.  Education needs identified: Yes  Barriers to learning identified: No  Patient verbalizes understanding of teaching instructions: Yes  Patient agreed to recommendations: Yes  Handouts provided: None    Nutrition Monitoring & Evaluation     1. Glucose/Endocrine Profile: Maintain glycemic control <180    2. Electrolyte Profile: Maintain phosphorus and potassium  WNL      01/06/2013   Ledora Bottcher Dietetic Intern  Herminio Commons, RD - Renal Transplant Dietitian  Phone: [phone] / Pager: [phone]

## 2013-01-06 NOTE — Progress Notes (Signed)
Transplant Date: 01/01/13    Allergies: No Known Allergies     Reviewed patient's medication bottles/box: yes   Reviewed patient's diary: yes  Assessed patient's/ family's compliance and understanding of medications: yes  Issues:  Patient at appointment with wife, patient and wife both are responsible for medications. Patient is very dependant on wife for answering questions.   1) DM  2) HTN  3) Pain    Barriers to education none  Barriers to Learning: none  Patient/Family Understanding: verbalizes    There were no vitals filed for this visit.    Graft function:   Recent labs for the past 72 hours     01/05/13 0908    POTASSIUM 4.5    CARBON DIOXIDE TOTAL 15    CREATININE BLOOD 3.98               Immunosuppressants:  Lab Results   Lab Name Value Date/Time    TACROLIMUS pend 01/05/2013  9:08 AM    TACROLIMUS 14.9 01/01/2013  9:09 AM   Tacrolimus (SANDOZ) 8mg  PO every 12 hours  Mycophenolate Mofetil (SANDOZ): 500mg  PO three times daily  Prednisone: no                  Heme:  Lab Results   Lab Name Value Date/Time    WBC 8.2 01/05/2013  9:08 AM    HCT 35.2 01/05/2013  9:08 AM    PLT 325 01/05/2013  9:08 AM     Iron Studies:  Lab Results   Lab Name Value Date/Time    FE 74 12/25/2012 12:23 PM    TRANSFERRIN 119* 12/25/2012 12:23 PM    TIBC 165* 12/25/2012 12:23 PM    FRTN 1030* 12/25/2012 12:23 PM     ID: Bacterial Px: Bactrim SS PO daily or Dapsone 100mg  PO daily  Viral Px: Valcyte 450mg  PO BIW; CMV status: D -/R +; EBV status D?/R+  Amoxicillin 500mg  BID for donor positive enterococcus End Date 01/19/13    CV: Blood Pressure Medications: yes Metoprolol 50mg  BID          Avg. BP: 121-144/63-70; Dizzy/Light-headed: no           Cholesterol Medications: yes Pravastatin 20mg  QHS; Muscle pain/ weakness: no     Lab Results   Lab Name Value Date/Time    CK 40 01/05/2013  9:08 AM    ALP 68 01/05/2013  9:08 AM    AST 12 01/05/2013  9:08 AM    ALT 12 01/05/2013  9:08 AM    TBIL 0.4 01/05/2013  9:08 AM    ALB 4.1 01/05/2013  9:08 AM                                           CAD: no                    Anti-platelet/coagulation:yes ASA 81mg      Endo: DM: yes; Avg. FSBG: 115-137 pre-prandial: 145-189   Lab Results   Lab Name Value Date/Time    GLU 144 01/05/2013  9:08 AM                     Diet controlled:  yes                    Oral medications: no  Insulin: yes                              8 units average per 24hr                  BLOOD SUGAR UNITS OF Novolog INSULIN   Inject subcutaneously   0-80 Apple juice/carbs and recheck in 30 min   81 - 110    111 - 150 2 units   151 - 200 4 units   201-250 6 units   251-300 8 units   301-350 10 units   351-400 12 units   Greater than 401 14 units and call coordinator                  Parathyroid:  Lab Results   Lab Name Value Date/Time    PO4 4.8 01/05/2013  9:08 AM    CA 8.9 01/05/2013  9:08 AM    KPhos neutral: no;  Diarrhea: no                         Thyroid: no   No results found for this basename: tsh, ft4    Hypo-thyroid Sx: no Hyper-thyroid Sx: no    GI:  Denied indigestion/N/V. no distress         Pain: Pain score: 0/10, with pt reported 10/10 after taking medications. Patient reports a burning sensation after taking medications which quickly resolves after pain medications are administered.                Pain controlled: yes               Pain meds: Norco 5/325 avg. 2 tabs per day    Current Outpatient Prescriptions   Medication Sig Dispense Refill    Amoxicillin (AMOXIL) 500 mg Capsule Take 1 capsule by mouth 2 times daily. Through 01/19/13  40 capsule  0    Aspirin 81 mg DR Tablet EC Tablet Take 1 tablet by mouth every morning.  100 tablet  3    Blood Sugar Diagnostic (ACCU-CHEK AVIVA PLUS TEST STRP) Strips Use to test blood glucose 4 times daily  100 strip  5    Cholecalciferol (VITAMIN D3) 1,000 unit Tablet Take 1 tablet by mouth every day.  100 tablet  5    Docusate (COLACE) 100 mg Capsule Take 1 capsule by mouth 2 times daily.  100 capsule  2    Epoetin (EPOGEN,  PROCRIT) 10,000 unit/mL Injection Inject 1 mL subcutaneously one time each week. Indications: Anemia due to Renal Failure  4 mL  1    FamoTIDine (PEPCID) 20 mg Tablet Tablet Take 1 tablet by mouth 2 times daily.  60 tablet  5    Hydrocodone 5 mg/Acetaminophen 325 mg (NORCO  5) 5-325 mg per tablet Take 1-2 tablets by mouth every 4 hours if needed (pain).  30 tablet  0    Insulin Aspart (NOVOLOG FLEXPEN) 100 unit/mL Pen  Inject 3 times a day WITH MEALS < 70, drink juice, 70-130,0 units;131-150,1 unit;151-180,2 units; 181-210,3 units; 211-240,4 units; FSBG 241-270, 5 units; FSBG 271-300,6 units; 301-330, 7 units; 331-360,8 units; 361-400,10 units;> 400,12 units.  15 mL  5    Insulin Pen Needles, Disposable, (BD ULTRA-FINE III PEN NEEDLE) 31 x 3/16 " 1 each three to four times daily or as directed. for insulin administration via insulin pen  100 each  5    Insulin Syringe-Needle U-100 1 mL 30 x 1/2" Use to administer Procrit injection once weekly as directed  4 syringe  1    Lancets (ACCU-CHEK SOFTCLIX LANCETS) Use to test blood glucose 4 times daily  100 each  5    Metoclopramide (REGLAN) 5 mg Tablet Take 1 tablet by mouth 4 times daily. 30 minutes before each meal  120 tablet  2    Metoprolol Tartrate (LOPRESSOR) 50 mg Tablet Take 1 tablet by mouth 2 times daily. Check BP if you are dizzy or light-headed. Call if your SBP > 180 or < 100, or your HR < 50  60 tablet  6    Multivitamin (HEXAVITAMIN) Tablet Take 1 tablet by mouth every morning.  100 tablet  3    Mycophenolate (MMF) 250 mg Capsule Take 2 capsules by mouth 3 times daily. Or as directed. Do NOT take calcium or antacids within 2 hours of any dose. (SANDOZ ONLY; DO NOT CHANGE MFG FOR REFILLS) +++ ICD 9: V 42.0 +++  180 capsule  3    Pravastatin (PRAVACHOL) 20 mg Tablet Take 1 tablet by mouth every morning.  30 tablet  5    Tacrolimus (PROGRAF) 1 mg Capsule Take 8 capsules by mouth 2 times daily. Or as directed by MD. Take doses every 12 hours. Take  morning dose AFTER blood draw on lab days. (SANDOZ ONLY; DO NOT CHANGE MFG FOR REFILLS) +++ ICD 9: V 42.0 +++  480 capsule  6    Trimethoprim 80 mg/Sulfamethoxazole 400 mg (BACTRIM SS) 400-80 mg Tablet Take 1 tablet by mouth every morning.  90 tablet  0    valGANciclovir (VALCYTE) 450 mg Tablet Take 1 tablet by mouth every Monday and Friday.  60 tablet  0     Assessment/Plan:  Patient denies difficulties in taking medications or any missed doses. Patient denies side effects to the medications. Patient has delayed graft function not requiring dialysis since discharge. Patient introduced to RAMP to aid in medication adherence.     1) IS: Patient is on FK 8mg  BID and MMF 500mg  TID. Patient will require an early refill for FK as supply will not last 1 month at current dose.   2) ID: Patient is on Bactrim SS and Valcyte 450mg  BID. Plan to increase Valcyte 450mg  QOD at this appointment. As renal function improves, recommend increasing to goal of Valcyte 450mg  QDay x 3 months.   3) HTN: BP slightly above goal, continue to monitor.   4) DM: Patient is currently on Novolog sliding scale. BS are not at goal. Continue to monitor. If BS remain elevated consider addition of long acting basal insulin.   5) Pain: Continue to monitor. Pain is well controlled, foley removed at this appointment. Patient reports 10/10 pain after taking medications, however resolved quickly with pain medications and is not described as either somatic or visceral pain. Will continue to monitor for resolution. At appointment patient reported 0/10 pain despite having recently taken 9am medications.     Anell Barr, PharmD

## 2013-01-13 ENCOUNTER — Encounter: Payer: Self-pay | Admitting: Nephrology

## 2013-01-13 ENCOUNTER — Ambulatory Visit: Admitting: Nephrology

## 2013-01-13 ENCOUNTER — Ambulatory Visit: Admit: 2013-01-13 | Discharge: 2013-01-13

## 2013-01-13 ENCOUNTER — Other Ambulatory Visit: Payer: Self-pay

## 2013-01-13 ENCOUNTER — Ambulatory Visit

## 2013-01-13 ENCOUNTER — Other Ambulatory Visit: Payer: Self-pay | Admitting: Surgery

## 2013-01-13 VITALS — BP 120/59 | HR 96 | Temp 100.2°F | Wt 151.9 lb

## 2013-01-13 DIAGNOSIS — I10 Essential (primary) hypertension: Secondary | ICD-10-CM | POA: Insufficient documentation

## 2013-01-13 DIAGNOSIS — E119 Type 2 diabetes mellitus without complications: Secondary | ICD-10-CM | POA: Insufficient documentation

## 2013-01-13 MED ORDER — VALGANCICLOVIR 450 MG TABLET
450.0000 mg | ORAL_TABLET | Freq: Every day | ORAL | 2 refills | Status: DC
Start: 2013-01-13 — End: 2013-01-14
  Filled 2013-01-13: fill #0

## 2013-01-13 NOTE — Patient Instructions (Addendum)
Today-  Have abdominal X-ray in [facility] Radiology/Rm [phone]  Urine test for BK studies at [facility]    Stent removal  Report to [facility] on 01/23/13.  You will be contacted with the time of your arrival before appt. Please arrive promptly. Failure to arrive promptly may cause your procedure to be cancelled.   Nothing to eat after midnight the evening before surgery.  Usual meds in AM (you may take a few hours early day of stent removal). You mave have sips of water only to take meds- do not take blood sugar lowering medications (Shots or pills) until you are again allowed to eat. If you take a long acting insulin such as Lantus, take only 1/2 your usual dose the evening before the procedure.    If you have any questions please call your coordinator.    Time of Procedure:  You may call [phone] at [facility] if you haven not been contacted by the scheduler. Procedure room is on the fifth floor.

## 2013-01-13 NOTE — Patient Instructions (Addendum)
1. Low-potassium diet (please follow guidelines you were given last week).    2. Take baking soda powder, 1 teaspoon in 1/2 glass of water twice a day.    3. Take valgancyclovir 450 mg once a day.    4. Please contact us immediately if you experience chills, fever higher than 100.5oF (please purchase a new thermometer and check your temperature twice a day), or new symptoms - vomiting, diarrhea, yellowish or greenish sputum, burning or pain when passing urine, cloudy urine.

## 2013-01-13 NOTE — Nursing Note (Signed)
>>   LACY WOOD, MA     Tue Jan 13, 2013 11:31 AM  Vital signs obtained. Drug allergies verified. Screened for pain. Pharmacy information updated.      Nash Dimmer, MA

## 2013-01-15 ENCOUNTER — Telehealth: Payer: Self-pay | Admitting: Specialist

## 2013-01-15 ENCOUNTER — Telehealth: Payer: Self-pay | Admitting: Nephrology

## 2013-01-15 LAB — OUTSIDE LABS
Alanine Transferase (ALT): 12
Albumin: 4.1
Alkaline Phosphatase (ALP): 68
Aspartate Transaminase (AST): 12
Bilirubin Total: 0.4
Calcium: 8.9
Carbon Dioxide Total: 15
Chloride: 111
Creatine Kinase: 40
Creatinine Blood: 3.98
Creatinine Spot Urine: 111
Glucose: 144
Hematocrit: 35.2
Hemoglobin: 11.7
Leuk. Esterase: NEGATIVE
Magnesium (Mg): 2
Neutrophils % Auto: 90
Neutrophils Abs Auto: 7380
Nitrite Urine: NEGATIVE
Phosphorus (PO4): 4.8
Platelet Count: 325
Potassium: 4.5
Protein Total Spot Urine: 58
Protein: 7
Sodium: 139
Specific Gravity: 1.013
Tacrolimus (FK506): 14.1
Urea Nitrogen, Blood (BUN): 52
White Blood Cell Count: 8.2

## 2013-01-15 NOTE — Telephone Encounter (Signed)
Message copied by Virgina Jock on Thu Jan 15, 2013  4:07 PM  ------       Message from: Virgina Jock       Created: Thu Jan 15, 2013 10:09 AM       Regarding: FW: fever         Message left requesting callback to see if anything is localized or if symptoms have changed from general malaise.              ----- Message -----          From: Aura Camps, MD          Sent: 01/14/2013  12:47 PM            To: Sabino Gasser, MD, Virgina Jock, RN       Subject: RE: fever                                                  Did not have any localizing symptoms (other than cough productive of a small amount of ? mucoid sputum) or physical examination findings when I evaluated him last afternoon.              Does he have any symptoms accompanying the fever?Marland Kitchen              In the absence of new symptoms, would obtain CBC with manual differential, urinalysis, and biochemical panel.              May need additional evaluation based on new information you may obtain.                     ----- Message -----          From: Sabino Gasser, MD          Sent: 01/14/2013  11:40 AM            To: Aura Camps, MD, #       Subject: RE: fever                                                  I have cc'd Dr. Rocco Serene since he saw the patient yesterday       ----- Message -----          From: Virgina Jock, RN          Sent: 01/14/2013   9:32 AM            To: Sabino Gasser, MD       Subject: FW: fever                                                  Seen by Dr.Golconda yesterday.       This is a bit higher than yesterday. Any thoughts?                     ----- Message -----          From: Donna Bernard, RN  Sent: 01/14/2013   9:19 AM            To: Virgina Jock, RN       Subject: fever                                                      Pt called with fever 101.5, advised tylenol,650mg  bid. Advised fluids and hydration. zHE says MD was told about it yesterday.       Thanks       Evangeline Dakin RN                                      ------

## 2013-01-15 NOTE — Progress Notes (Signed)
PATIENTANTONEY, Galvan  MR #:  [id number]  DOB:  October 28, 1965  SEX:  M  AGE:  47  SERVICE DATE:  01/13/2013  TRANSPLANT CENTER CLINIC NOTE    [name] with a history of stage  V chronic kidney disease in the setting of type II diabetes mellitus  and hypertension who received a deceased-donor renal transplant  ("imported" en-bloc kidneys from a 43-month-old, 10.8-kg donor with  acute kidney injury - terminal creatinine 2.8 mg/dL) on 14/78/29.  Prior to his transplant, hemodialysis had been initiated on 11/03/10.    [name] is nonsensitized.  Following thymoglobulin induction (five  doses, since he was receiving kidneys from a pediatric donor), he is  on dual maintenance immunosuppressive therapy.    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    The immediate posttransplant course was remarkable for:  1. "Slow graft function."  Serum creatinine at discharge, on  postoperative day 9 was 7.8 mg/dL.    2. Gross hematuria - he was discharged with an indwelling bladder  catheter.    3. Persistent nausea.    After Enterococcus faecalis was isolated from donor urinary cultures,  he was discharged with instructions to complete a four-week course of  amoxicillin.    In addition to type II diabetes mellitus complicated by retinopathy  and peripheral neuropathy, and hypertension, his history is also  remarkable for hyperlipidemia, depression, vascular access creation,  bilateral vitrectomy, bilateral carpal tunnel release and rotator cuff  surgeries.    I saw [name] in the Transplant Clinic this morning (01/13/13) for a  followup visit.    PERSONAL/SOCIAL HISTORY:    He used to work as a Presenter, broadcasting.  He is currently a Consulting civil engineer  (human services).  He does not have a history of smoking, alcohol  abuse or recreational drug use.    FAMILY HISTORY:    He has a family history of diabetes mellitus (mother and two sisters).  There is no family history of chronic kidney  disease.    SUBJECTIVE:    [name] told me that he was still tired and, as a result, was  experiencing difficulty with studying.  He reported feeling extremely  fatigued following effort and experiencing shortness of breath with  effort.  He denied chest pain, palpitations or lightheadedness.  He  said that over the past week, he had been intermittently sneezing and  occasionally had cough productive of a small amount of sputum (he  thinks that the sputum has been clear).  The thermometer that he was  provided at discharge has not been working, so it is not known if his  temperature has been elevated.  He denied chills.  He said that he had  felt nauseous earlier this morning.  He denied nausea, vomiting or  diarrhea.  In response to my question, he told me that he was not  aware of other family members or social contacts being unwell.  He  said that he continued to have numbness in the lateral aspect of his  left thigh (since surgery).  He denied weakness in that leg.  He has  been able to void without difficulty, and I learnt that urine was not  smelly or cloudy.  He also denied pain during micturition.  He denied  skin rash or itching.  He has not had joint pain or swelling.  He has  not noticed any enlarged lymph nodes.  CURRENT MEDICATIONS:    MMF (Sandoz) 500 mg three times a day, tacrolimus (Sandoz) 8 mg twice  a day, trimethoprim/sulfamethoxazole single-strength 450 mg on Mondays  and Thursdays, amoxicillin 500 mg twice a day (four-week course, to be  completed on 01/19/13), insulin aspart before meals - dose based on  sliding scale, metoprolol 50 mg twice a day, aspirin 81 mg a day,  pravastatin 20 mg a day, famotidine 20 mg twice a day, metoclopramide  5 mg before each meal PRN for nausea, multivitamin one tablet daily,  and vitamin D3 1000 units a day.    ALLERGIES:  None known.    PHYSICAL EXAMINATION:    GENERAL APPEARANCE:  He was alert and oriented.    VITAL SIGNS:  Weight 68.9 kg, blood pressure  120/59 mmHg, pulse 96 per  minute, regular, and temperature 37.9 degrees centigrade.    EYES:  He did not have conjunctival pallor or scleral icterus.    MOUTH:  Oral mucous membranes were moist.  He did not have pharyngeal  injection, oral thrush or ulceration.    CARDIOVASCULAR:  He did not have pedal edema.  Normal heart sounds  were heard.    RESPIRATORY:  Vesicular breath sounds were heard.  No wheezes or  crackles were heard.    GI:  Abdomen was soft and nontender.  Normal bowel sounds were heard.    GU:  His left lower quadrant surgical incision is healing well.  There  was no erythema along the surgical incision. The renal allograft was  nontender.  A bruit was heard over the allograft.    SKIN:  He did not have skin rash.    MUSCULOSKELETAL:  He did not have joint swelling or deformities.    LYMPHORETICULAR:  He did not have cervical lymphadenopathy.    NERVOUS SYSTEM:  He did not have tremor or motor neurologic deficits.  He had patchy numbness in the lateral aspect of his left thigh and  decreased sensation in a stocking distribution in his legs.    PSYCH:  Affect was normal.    LABORATORY DATA:    (01/12/13) Hemoglobin 11.7, hematocrit 35.1, white cell count 3.1,  platelet count 231.  BUN 38, creatinine 2.29, sodium 133, potassium  5.9, chloride 108, carbon dioxide 15.  Glucose 219.  Calcium 9.3,  magnesium 2, phosphorus 3.2, albumin 4.3.  Total bilirubin 0.4, AST  11, ALT 9, ALP 96.  Tacrolimus level pending (level on 01/08/13 was  12).  Urinalysis was remarkable for 3+ glucose and 1+ protein.    ASSESSMENT/PLAN:    1.  Deceased-donor (en-bloc kidneys from a donor with acute kidney  injury) renal transplant recipient, three weeks posttransplant.  His most recent serum creatinine of 2.29 mg/dL is the nadir value till  date.    2.  Immunosuppression  He is nonsensitized.  Following thymoglobulin induction, he is on dual  maintenance immunosuppressive therapy.  I have not made any changes to his  immunosuppressive regimen today.    3.  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  With improvement  in renal function, I have increased the dose of valganciclovir to 450  mg once a day.  He will take trimethoprim/sulfamethoxazole for three months for  prophylaxis against Pneumocystis pneumonia and urinary tract  infections.    4.  Donor enterococcal urinary tract infection - he is scheduled to  complete the four-week course of amoxicillin on 01/19/13.    5.  Hyperkalemia/metabolic acidosis  I have stressed the need to follow low-potassium dietary guidelines  (as given last week).  I have told him to take baking soda powder to be taken in the dose of  one teaspoon in half a glass of water twice a day.    6.  Type II diabetes mellitus  On insulin.  Capillary blood glucose levels in the fasting state have ranged  between 136 and 186 mg/dL and, when checked before other meals and at  bedtime, between 146 and 209 mg/dL.    7.  Hypertension  On metoprolol.  Home blood pressure levels have ranged between 144 and 172 mmHg  systolic and between 51 and 77 mmHg diastolic.    8.  Low-grade fever - temperature in the clinic this morning was 37.9  degrees centigrade.  I have advised him to purchase a thermometer and  check his temperature twice a day.  I have told him to contact us  should he note a temperature greater than 100.5 degrees Fahrenheit,  experience chills, or develop symptoms such as vomiting, diarrhea,  cough productive of yellowish or greenish sputum, chest pain, burning  or discomfort when passing urine or note that his urine has become  cloudy.    [name] will continue to have laboratory tests twice a week.    I will see him for a followup visit in a week's time.      Report Electronically Signed - 01/15/2013 10:03:28 by  Aura Camps, MD  Clinical Professor  Internal Medicine  Transplant Nephrology    MSG/th  D:  01/14/2013 17:53:01 PDT/PST  T:  01/15/2013  08:53:33 PDT/PST  Job #:  [id number] / [id number]    cc:   Duncan Dull. Luisa Hart, MD, [facility], [address],  [address], [place], [place] [address]

## 2013-01-15 NOTE — Telephone Encounter (Signed)
Received call from [facility] with priority 1 lab result - K of 6.3 drawn this morning at 9:39 am.  Called Dr. Tiburcio Pea who states patient needs Kayexalate 30 grams daily x 3 days.  Called rx into [facility] in [place] per pt. Request.  Patient will pick up Kayexalate and take first dose tonight.  Patient instructed to avoid high potassium foods and go to ED if he develops chest pain or SOB.  Patient verbalized understanding and will have routine labs drawn on Monday.

## 2013-01-19 ENCOUNTER — Encounter: Payer: Self-pay | Admitting: Specialist

## 2013-01-19 LAB — OUTSIDE LABS
Alanine Transferase (ALT): 10
Albumin: 4.2
Alkaline Phosphatase (ALP): 76
Aspartate Transaminase (AST): 12
Bilirubin Total: 0.4
Calcium: 9.1
Carbon Dioxide Total: 16
Chloride: 112
Creatine Kinase: 37
Creatinine Blood: 2.55
Creatinine Spot Urine: 112
Glucose: 179
Hematocrit: 35.6
Hemoglobin: 11.5
Leuk. Esterase: NEGATIVE
Magnesium (Mg): 2
Neutrophils % Auto: 90
Neutrophils Abs Auto: 5040
Nitrite Urine: NEGATIVE
Phosphorus (PO4): 3.6
Platelet Count: 268
Potassium: 5.5
Protein Total Spot Urine: 44
Protein: 7.4
Sodium: 137
Specific Gravity: 1.018
Tacrolimus (FK506): 12
Urea Nitrogen, Blood (BUN): 42
White Blood Cell Count: 5.6

## 2013-01-19 NOTE — Telephone Encounter (Signed)
Rn note: Patient calling this morning report ongoing fevers, no drainage, no swelling in any area. But is experiencing SOB when does activities.  Not experiencing any pain He was able to keep a conversation on phone without gasping for air.  He was called over the weekend due to elevated potassium and has been taking Kayexalate for the past three days.  He is producing plenty of urine and with no symptoms.  He had his blood drawn today.       Please advise, he has a clinic visit in the morning. Can this wait?    Crisoforo Oxford RN

## 2013-01-20 ENCOUNTER — Encounter: Payer: Self-pay | Admitting: Specialist

## 2013-01-20 ENCOUNTER — Other Ambulatory Visit: Payer: Self-pay

## 2013-01-20 ENCOUNTER — Encounter: Payer: Self-pay | Admitting: Nephrology

## 2013-01-20 ENCOUNTER — Ambulatory Visit: Admitting: Nephrology

## 2013-01-20 ENCOUNTER — Ambulatory Visit: Payer: MEDICARE

## 2013-01-20 VITALS — BP 102/60 | HR 86 | Temp 97.9°F | Wt 147.3 lb

## 2013-01-20 DIAGNOSIS — R509 Fever, unspecified: Secondary | ICD-10-CM | POA: Insufficient documentation

## 2013-01-20 DIAGNOSIS — Z48298 Encounter for aftercare following other organ transplant: Secondary | ICD-10-CM | POA: Insufficient documentation

## 2013-01-20 DIAGNOSIS — E872 Acidosis, unspecified: Secondary | ICD-10-CM | POA: Insufficient documentation

## 2013-01-20 DIAGNOSIS — Z7960 Long term (current) use of unspecified immunomodulators and immunosuppressants: Secondary | ICD-10-CM | POA: Insufficient documentation

## 2013-01-20 MED ORDER — BLOOD SUGAR DIAGNOSTIC STRIPS
ORAL_STRIP | 5 refills | Status: DC
Start: 2013-01-20 — End: 2013-07-08
  Filled 2013-01-20: qty 100, 25d supply, fill #0
  Filled 2013-02-17: qty 100, 25d supply, fill #1
  Filled 2013-03-17: qty 100, 25d supply, fill #2
  Filled 2013-04-14: qty 100, 25d supply, fill #3
  Filled 2013-05-11: qty 100, 25d supply, fill #4
  Filled 2013-06-09: qty 100, 25d supply, fill #5

## 2013-01-20 MED ORDER — LANCETS
5 refills | Status: DC
Start: 2013-01-20 — End: 2013-07-08
  Filled 2013-01-20: qty 100, 25d supply, fill #0
  Filled 2013-02-17: qty 100, 25d supply, fill #1
  Filled 2013-03-17: qty 100, 25d supply, fill #2
  Filled 2013-04-14: qty 100, 25d supply, fill #3
  Filled 2013-05-11: qty 100, 25d supply, fill #4
  Filled 2013-06-09: qty 100, 25d supply, fill #5

## 2013-01-20 MED ORDER — METOCLOPRAMIDE 5 MG TABLET
5.0000 mg | ORAL_TABLET | Freq: Four times a day (QID) | ORAL | 3 refills | Status: DC
Start: 2013-01-20 — End: 2013-03-05
  Filled 2013-01-20 (×2): qty 120, 30d supply, fill #0
  Filled 2013-02-13 – 2013-02-19 (×2): qty 120, 30d supply, fill #1

## 2013-01-20 MED ORDER — METOPROLOL TARTRATE 25 MG TABLET
12.5000 mg | ORAL_TABLET | Freq: Two times a day (BID) | ORAL | 11 refills | Status: DC
Start: 2013-01-20 — End: 2013-01-27
  Filled 2013-01-20: qty 30, 30d supply, fill #0

## 2013-01-20 MED ORDER — INSULIN SYRINGE-NEEDLE U-100 HALF UNIT MARKING 0.3 ML 31 GAUGE X 5/16"
INJECTION | 3 refills | Status: DC
Start: 2013-01-20 — End: 2013-12-21
  Filled 2013-01-20: qty 30, 30d supply, fill #0
  Filled 2013-02-17: qty 30, 30d supply, fill #1
  Filled 2013-03-17: qty 30, 30d supply, fill #2
  Filled 2013-04-14: qty 30, 30d supply, fill #3
  Filled 2013-05-11: qty 30, 30d supply, fill #4
  Filled 2013-06-09: qty 30, 30d supply, fill #5
  Filled 2013-07-08: qty 30, 30d supply, fill #6
  Filled 2013-08-04: qty 30, 30d supply, fill #7
  Filled 2013-09-03: qty 30, 30d supply, fill #8
  Filled 2013-09-29: qty 30, 30d supply, fill #9
  Filled 2013-10-28: qty 30, 30d supply, fill #10
  Filled 2013-12-01: qty 30, 30d supply, fill #11

## 2013-01-20 MED ORDER — INSULIN GLARGINE 100 UNIT/ML SUB-Q VIAL
6.0000 [IU] | Freq: Every day | SUBCUTANEOUS | 11 refills | Status: DC
Start: 2013-01-20 — End: 2013-01-27
  Filled 2013-01-20: qty 10, 28d supply, fill #0

## 2013-01-20 MED ORDER — VALGANCICLOVIR 450 MG TABLET
450.0000 mg | ORAL_TABLET | Freq: Every day | ORAL | 1 refills | Status: AC
Start: 2013-01-20 — End: 2013-03-23
  Filled 2013-01-20: qty 30, 30d supply, fill #0
  Filled 2013-01-20: fill #0
  Filled 2013-02-13: qty 30, 30d supply, fill #1

## 2013-01-20 NOTE — Patient Instructions (Addendum)
1. Reduce dose of metoprolol to 12.5 mg twice a day.  Please call us if your systolic BP remains under 100 mm Hg.    2. Start insulin glargine - 6 units in the evening.    3. Do NOT take acetaminophen.     4. Please call us if your temperature exceeds 102oF.

## 2013-01-20 NOTE — Progress Notes (Signed)
[name] with a history of stage V chronic kidney disease in the setting of type II diabetes mellitus and hypertension who received a deceased-donor renal transplant ("imported" en-bloc kidneys from a 29-month-old, 10.8-kg donor with acute kidney injury - terminal creatinine 2.8 mg/dL) on 16/10/96. Prior to his transplant, hemodialysis had been initiated on 11/03/10.    [name] is nonsensitized. Following thymoglobulin induction (five doses, since he was receiving kidneys from a pediatric donor), he ison dual maintenance immunosuppressive therapy.    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    The immediate posttransplant course was remarkable for:   1. "Slow graft function." Serum creatinine at discharge, on postoperative day 9 was 7.8 mg/dL.    2. Gross hematuria - he was discharged with an indwelling bladder catheter.    3. Persistent nausea.    After Enterococcus faecalis was isolated from donor urinary cultures, he received a four-week course of amoxicillin.    Creatinine nadir till date has been the most recent value of 2.16 mg/dL.    He received 3 doses of kayexylate last week to treat hyperkalemia.    In addition to type II diabetes mellitus complicated by retinopathy and peripheral neuropathy, and hypertension, his history is also remarkable for hyperlipidemia, depression, vascular access creation, bilateral vitrectomy, bilateral carpal tunnel release and rotator cuff surgeries.    I saw [name] in the Transplant Clinic this morning (01/20/13) for a followup visit.    SUBJECTIVE:  [name] told me that he continued to have fever & chills and was taking acetaminophen intermittently. Maximum temperature over the past week has been 101.6oF.   He told me that he felt weak & dizzy, and was short of breath following effort.   Other symptoms he has been experiencing are mild frontal headache without any changes in vision, mild nausea -  in the mornings, and joint pain - especially in the wrists and elbows.    REVIEW OF SYSTEMS:  No cough or expectoration.  Denies chest pain or palpitations.  Appetite good. No heartburn or vomiting. Normal bowel movements.  No difficulty voiding, no burning or discomfort during micturition. Urine clear and not smelly.  Denies itching, no skin rash  Has not noted any enlarged lymph nodes.  Denies tremor, numbness or focal limb weakness.    MEDICATIONS:  Current Outpatient Prescriptions   Medication Sig    Aspirin 81 mg DR Tablet EC Tablet Take 1 tablet by mouth every morning.    Blood Sugar Diagnostic (ACCU-CHEK AVIVA PLUS TEST STRP) Strips Use to test blood glucose 4 times daily    Cholecalciferol (VITAMIN D3) 1,000 unit Tablet Take 1 tablet by mouth every day.    Docusate (COLACE) 100 mg Capsule Take 1 capsule by mouth once daily if needed.    FamoTIDine (PEPCID) 20 mg Tablet Tablet Take 1 tablet by mouth 2 times daily.    Insulin Aspart (NOVOLOG FLEXPEN) 100 unit/mL Pen  Inject 3 times a day WITH MEALS < 70, drink juice, 70-130,0 units;131-150,1 unit;151-180,2 units; 181-210,3 units; 211-240,4 units; FSBG 241-270, 5 units; FSBG 271-300,6 units; 301-330, 7 units; 331-360,8 units; 361-400,10 units;> 400,12 units.    Insulin Glargine (LANTUS) 100 unit/mL Vial Inject 6 Units subcutaneously every day at bedtime.    Insulin Pen Needles, Disposable, (BD ULTRA-FINE III PEN NEEDLE) 31 x 3/16 " 1 each three to four times daily or as directed. for insulin administration via insulin pen    insulin  syr/ndl U100 half mark (BD INSULIN SYRINGE HALF UNIT MARKINGS) 0.3 mL 31 x 5/16" Syringe use 1 syringe to inject lantus daily at bedtime    Insulin Syringe-Needle U-100 1 mL 30 x 1/2" Use to administer Procrit injection once weekly as directed    Lancets (ACCU-CHEK SOFTCLIX LANCETS) Use to test blood glucose 4 times daily    Metoclopramide (REGLAN) 5 mg Tablet Take 1 tablet by mouth if needed (For nausea). 30 minutes  before each meal    Metoprolol Tartrate (LOPRESSOR) 25 mg Tablet Take 0.5 tablets by mouth 2 times daily.    Multivitamin (HEXAVITAMIN) Tablet Take 1 tablet by mouth every morning.    Mycophenolate (MMF) 250 mg Capsule Take 2 capsules by mouth 3 times daily. Or as directed. Do NOT take calcium or antacids within 2 hours of any dose. (SANDOZ ONLY; DO NOT CHANGE MFG FOR REFILLS) +++ ICD 9: V 42.0 +++    Pravastatin (PRAVACHOL) 20 mg Tablet Take 1 tablet by mouth every morning.    Sodium Bicarbonate 650 mg Tablet 1 teaspoonful of baking soda powder in 1/2 glass of water, twice a day.    Tacrolimus (PROGRAF) 1 mg Capsule Take 8 capsules by mouth 2 times daily. Or as directed by MD. Take doses every 12 hours. Take morning dose AFTER blood draw on lab days. (SANDOZ ONLY; DO NOT CHANGE MFG FOR REFILLS) +++ ICD 9: V 42.0 +++    Trimethoprim 80 mg/Sulfamethoxazole 400 mg (BACTRIM SS) 400-80 mg Tablet Take 1 tablet by mouth every morning.    valGANciclovir (VALCYTE) 450 mg Tablet Take 1 tablet by mouth every day.     No current facility-administered medications for this visit.     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   He does not have a history of smoking, alcohol abuse or recreational drug use.    FAMILY HISTORY:  He has a family history of diabetes mellitus (mother and two sisters).   There is no family history of chronic kidney disease.    PHYSICAL EXAMINATION:  GENERAL APPEARANCE: Alert, oriented.  VITAL SIGNS: BP 102/60  Pulse 86  Temp(Src) 36.6 C (97.9 F) (Tympanic)  Wt 66.8 kg (147 lb 4.3 oz)  BMI 26.09 kg/m2  SpO2 100%  I repeated BP measurements (right arm, sitting position) using the clinic manual BP apparatus and his personal machine and obtained readings of  90/42 and 94/52  mm Hg respectively.   During the clinic visit, he was able to walk up 3 flights of stairs after which he reported  feeling short of breath. Oxygen saturation before the effort was 99% and following exertion was 100%.  EYES: No conjunctival pallor or scleral icterus.   MOUTH: Oral mucosa moist. No thrush or ulcers.   CARDIOVASCULAR: No edema, normal heart sounds, no murmurs heard.   RESPIRATORY: Bronchovesicular breath sounds over both lower hemithoraces, no wheezes or crackles heard.  GASTROINTESTINAL: Abdomen soft, non tender, active bowel sounds heard.  GENITOURINARY: Left lower quadrant surgical incision is well healed. No tenderness to palpation over renal allograft.  Bruit heard over allograft site.  SKIN: No rash.    MUSCULOSKELETAL: No joint swelling, warmth or tenderness. Normal muscle bulk and tone.  LYMPHORETICULAR:  No cervical or axillary lymphadenopathy.  NERVOUS SYSTEM: No signs of meningeal irritation, no sensory or motor deficits.    LABORATORY DATA:     01/19/2013  SODIUM 135   POTASSIUM 4.6   CHLORIDE 106   CARBON DIOXIDE TOTAL 20   UREA NITROGEN, BLOOD (BUN) 31   CREATININE BLOOD 2.16   GLUCOSE 206   PHOSPHORUS (PO4) 2.9   CALCIUM 9.0   PROTEIN 7.7   ALBUMIN 4.2   ALKALINE PHOSPHATASE (ALP) 102   ASPARTATE TRANSAMINASE (AST) 12   BILIRUBIN TOTAL 0.3   ALANINE TRANSFERASE (ALT) 12   CREATINE KINASE 23   MAGNESIUM (MG) 2.2   WHITE BLOOD CELL COUNT 3.4   HEMOGLOBIN 11.5   HEMATOCRIT 33.6   PLATELET COUNT 224   NEUTROPHILS % AUTO 81   NEUTROPHIL ABS AUTO 2754   TACROLIMUS (FK506) 13.1   SPECIFIC GRAVITY 1.020   OCCULT BLOOD URINE neg   GLUCOSE URINE 3+   PROTEIN URINE 1+   NITRITE URINE neg   LEUK. ESTERASE neg   WBC/HPF 0-5   RBC/HPF 0-3   CREATININE SPOT URINE 251   PROTEIN TOTAL SPOT URINE 55       ASSESSMENT & RECOMMENDATIONS:    1. Deceased-donor (en-bloc kidneys from a donor with acute kidney injury) renal transplant recipient, one month posttransplant.   His most recent serum creatinine of 2.16 mg/dL is the nadir value till date.    2. Febrile illness  He does not have any localizing symptoms. The  only abnormal finding on physical examination was bronchovesicular breath sounds over the lower hemithoraces. However, a chest X-ray did not reveal any abnormalities.  CMV PCR has been checked today.  I have advised him:  - To continue to drink plenty of oral fluids.  - Not to take acetaminophen.   - Call us if temperature exceeds 102oF or if he develops any new symptoms.    3. Immunosuppression  He is nonsensitized. Following thymoglobulin induction, he is on dual maintenance immunosuppressive therapy.  I have not made any changes to his immunosuppressive regimen.    4. 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 He will receive valganciclovir for 55-months.  He will take trimethoprim/sulfamethoxazole, also for three months, as prophylaxis against pneumocystis pneumonia and urinary tract infections.    5. Metabolic acidosis  He is receiving supplemental sodium bicarbonate.    6. Type II diabetes mellitus  On insulin aspart alone.  Capillary blood glucose levels in the fasting state have ranged between 168 and 243 mg/dL and, when checked before other meals and at bedtime, between 123 and 278 mg/dL.  I have prescribed insulin glargine - 6 units in the evening.    7. Hypertension  On metoprolol.  While measured home blood pressure levels have ranged between 127 and 169 mmHg systolic and between 59 and 83 mmHg diastolic, his technique of measuring BP is incorrect. I have taught him the correct method to check his BP levels.  Given the low BP levels documented today, I have told [name] to reduce dose of metoprolol to 12.5 mg twice a day and to call us if systolic BP remains under 100 mm Hg.    [name] will continue to have laboratory tests twice a week.    He will be seen for a followup visit in a week's time.    The patient was counseled about the above assessment and recommendations and understood the discussion.          Electronically signed by   Roxana Hires, MD, MRCP Blanca Friend)  PI # [id number]  Clinical Professor  Section of Transplant  Nephrology  Department of Internal Medicine

## 2013-01-20 NOTE — Nursing Note (Signed)
>>   LACY WOOD, MA     Tue Jan 20, 2013  9:39 AM  Vital signs obtained. Drug allergies verified. Screened for pain. Pharmacy information updated.      Nash Dimmer, MA

## 2013-01-22 ENCOUNTER — Other Ambulatory Visit: Payer: Self-pay

## 2013-01-22 NOTE — H&P (Addendum)
HISTORY AND PHYSICAL    PROCEDURE:  Cystoscopy and ureteral stent removal.    HPI:  Charles Galvan is a 47yr-old man who is status post dual deceased donor renal transplantation on 12/22/12 by Dr. Carlynn Purl.Two ureteral stent were placed during the operation.  The postop course was not complicated.The patient was discharged home with delayed graft function likely needing dialysis as an outpatient.  He returns today for removal of his double J stents via cystoscopy.    Past Surgical History  BL Laser photcoagulaton and retinal surgeries  B/L carpal tunnel surgeries  B/L rotator cuff repairs in shoulders  Laparoscopic cholecystectomy  B/L inguinal hernias with mesh - patient states this was done laparoscopically  Dual DDRT     Past Medical History:    Kidney disease                                                Diabetes mellitus                                             Hypertension                                                  MEDICATIONS:  Current outpatient prescriptions:Aspirin 81 mg DR Tablet EC Tablet, Take 1 tablet by mouth every morning., Disp: 100 tablet, Rfl: 3;  Blood Sugar Diagnostic (ACCU-CHEK AVIVA PLUS TEST STRP) Strips, Use to test blood glucose 4 times daily, Disp: 100 strip, Rfl: 5;  Cholecalciferol (VITAMIN D3) 1,000 unit Tablet, Take 1 tablet by mouth every day., Disp: 100 tablet, Rfl: 5  Docusate (COLACE) 100 mg Capsule, Take 1 capsule by mouth once daily if needed., Disp: , Rfl: ;  FamoTIDine (PEPCID) 20 mg Tablet Tablet, Take 1 tablet by mouth 2 times daily., Disp: 60 tablet, Rfl: 5  Insulin Aspart (NOVOLOG FLEXPEN) 100 unit/mL Pen,  Inject 3 times a day WITH MEALS < 70, drink juice, 70-130,0 units;131-150,1 unit;151-180,2 units; 181-210,3 units; 211-240,4 units; FSBG 241-270, 5 units; FSBG 271-300,6 units; 301-330, 7 units; 331-360,8 units; 361-400,10 units;> 400,12 units., Disp: 15 mL, Rfl: 5;  Insulin Glargine (LANTUS) 100 unit/mL Vial, Inject 6 Units subcutaneously every day at  bedtime., Disp: 10 mL, Rfl: 11  Insulin Pen Needles, Disposable, (BD ULTRA-FINE III PEN NEEDLE) 31 x 3/16 ", 1 each three to four times daily or as directed. for insulin administration via insulin pen, Disp: 100 each, Rfl: 5;  insulin syr/ndl U100 half mark (BD INSULIN SYRINGE HALF UNIT MARKINGS) 0.3 mL 31 x 5/16" Syringe, use 1 syringe to inject lantus daily at bedtime, Disp: 100 syringe, Rfl: 3  Insulin Syringe-Needle U-100 1 mL 30 x 1/2", Use to administer Procrit injection once weekly as directed, Disp: 4 syringe, Rfl: 1;  Lancets (ACCU-CHEK SOFTCLIX LANCETS), Use to test blood glucose 4 times daily, Disp: 100 each, Rfl: 5;  Metoclopramide (REGLAN) 5 mg Tablet, Take 1 tablet by mouth 4 times daily with food. 30 minutes before each meal, Disp: 120 tablet, Rfl: 3  Metoprolol Tartrate (LOPRESSOR) 25 mg Tablet, Take 0.5 tablets by mouth  2 times daily., Disp: 60 tablet, Rfl: 11;  Multivitamin (HEXAVITAMIN) Tablet, Take 1 tablet by mouth every morning., Disp: 100 tablet, Rfl: 3  Mycophenolate (MMF) 250 mg Capsule, Take 2 capsules by mouth 3 times daily. Or as directed. Do NOT take calcium or antacids within 2 hours of any dose. (SANDOZ ONLY; DO NOT CHANGE MFG FOR REFILLS) +++ ICD 9: V 42.0 +++, Disp: 180 capsule, Rfl: 3;  Pravastatin (PRAVACHOL) 20 mg Tablet, Take 1 tablet by mouth every morning., Disp: 30 tablet, Rfl: 5  Sodium Bicarbonate 650 mg Tablet, 1 teaspoonful of baking soda powder in 1/2 glass of water, twice a day., Disp: , Rfl: ;  Tacrolimus (PROGRAF) 1 mg Capsule, Take 8 capsules by mouth 2 times daily. Or as directed by MD. Take doses every 12 hours. Take morning dose AFTER blood draw on lab days. (SANDOZ ONLY; DO NOT CHANGE MFG FOR REFILLS) +++ ICD 9: V 42.0 +++, Disp: 480 capsule, Rfl: 6  Trimethoprim 80 mg/Sulfamethoxazole 400 mg (BACTRIM SS) 400-80 mg Tablet, Take 1 tablet by mouth every morning., Disp: 90 tablet, Rfl: 0;  valGANciclovir (VALCYTE) 450 mg Tablet, Take 1 tablet by mouth every day.,  Disp: 30 tablet, Rfl: 1  Current facility-administered medications:Lidocaine (XYLOCAINE) 10 mg/mL (1%) Injection 0.1-0.25 mL, 0.1-0.25 mL, INFILTRATION, PRN, Jaymes Graff, MD;  Metoclopramide (REGLAN) Injection 10 mg, 10 mg, IV, PRN X 1, Jaymes Graff, MD;  NaCl 0.9% Infusion, , IV, CONTINUOUS, Jaymes Graff, MD;  NaCl 0.9% Infusion, , IV, CONTINUOUS, Jaymes Graff, MD, Last Rate: 50 mL/hr at 01/23/13 0756  Ondansetron (ZOFRAN) Injection 4 mg, 4 mg, IV, PRN X 1, Jaymes Graff, MD    ALLERGIES:  No Known Allergies      SOCIAL HISTORY:  History     Social History    Marital Status: MARRIED     Spouse Name: N/A     Number of Children: N/A    Years of Education: N/A     Occupational History    Not on file.     Social History Main Topics    Smoking status: Never Smoker     Smokeless tobacco: Never Used    Alcohol Use: No    Drug Use: No    Sexually Active: Not on file     Other Topics Concern    Not on file     Social History Narrative    No narrative on file     FAMILY HISTORY:   Mother: Has DM, alive at age 57  68 year old healthy brother  Two sisters with gestational DM  Three kids (1 son, 2 daughters, both healthy)    REVIEW OF SYSTEMS:  A 14-point review of systems was performed and was negative except as noted in the HPI.    PHYSICAL EXAM:  GEN: NAD  HEENT: anicteric  CV: RRR  RESP: CTAB  ABD: soft, NT, ND, well healed left LQ incision  EXT: warm, no edema.     LABORATORIES:  Please see EMR for most up-to-date lab values.    IMAGES:  Please see EMR for most up-to-date radiographic images, including KUB performed prior to cystoscopy.    ASSESSMENT:  Charles Galvan is a 47yr-old male who is status post dual decease donor renal transplantation.  He returns today for cystoscopy and removal of ureteral stents.    PLAN:  1.Was NPO since midnight for the procedure.   2.Admitted for a same-day surgery to the [facility] at [facility].   3.Preop orders placed prior to the procedure.   4.He can expected to be discharged the same day after an appropriate amount of time in the recovery area.  5.  Prior to the procedure, he was given the opportunity to provide verbal and written consent.  The risks, benefits, and alternatives to the procedure were discussed.  The risks include, but are not limited to, urethra injury, bladder injury, bleeding, infection, retained stent material, need for more procedures, and discomfort.  He was given time to ask any questions and they were all answered.    Electronically signed by:   Nolon Stalls MD  PGY1 General Surgery  PI# [id number]        DATE OF SERVICE:  01/23/2013    TRANSPLANT ATTENDING SURGEON ADMISSION HISTORY AND PHYSICAL UPDATE NOTE    I reviewed this patient's medical record, obtained the interim history from the patient, reviewed all pertinent available imaging studies, did a physical examination, and saw the patient with Dr. Neita Goodnight and agree with her findings, documentation and admission and inpatient treatment plan we developed together.     Electronically signed by: Faythe Ghee, MD, FACS      PI# [id number]  Attending Surgeon; Department of Surgery, Division of Transplantation  [facility]

## 2013-01-23 ENCOUNTER — Ambulatory Visit: Admit: 2013-01-23 | Discharge: 2013-01-23

## 2013-01-23 ENCOUNTER — Encounter: Payer: Self-pay | Admitting: Specialist

## 2013-01-23 VITALS — BP 123/54 | HR 80 | Temp 99.7°F | Resp 12 | Ht 63.0 in | Wt 148.0 lb

## 2013-01-23 MED ORDER — NACL 0.9% IV INFUSION
INTRAVENOUS | Status: DC
Start: 2013-01-23 — End: 2013-01-23

## 2013-01-23 MED ORDER — METOCLOPRAMIDE 5 MG/ML INJECTION SOLUTION
10.0000 mg | INTRAMUSCULAR | Status: DC | PRN
Start: 2013-01-23 — End: 2013-01-23

## 2013-01-23 MED ORDER — NACL 0.9% IV INFUSION
INTRAVENOUS | Status: DC
Start: 2013-01-23 — End: 2013-01-23
  Administered 2013-01-23: 08:00:00 via INTRAVENOUS

## 2013-01-23 MED ORDER — LIDOCAINE HCL 10 MG/ML (1 %) INJECTION SOLUTION
0.1000 mL | INTRAMUSCULAR | Status: DC | PRN
Start: 2013-01-23 — End: 2013-01-23

## 2013-01-23 MED ORDER — ONDANSETRON HCL (PF) 4 MG/2 ML INJECTION SOLUTION
4.0000 mg | INTRAMUSCULAR | Status: DC | PRN
Start: 2013-01-23 — End: 2013-01-23

## 2013-01-23 NOTE — Progress Notes (Signed)
ANESTHESIA ATTENDING  PACU  SIGN-OUT NOTE    01/23/2013  11:39     This is a 78yr male s/p Cystourethroscopy,  Cystoscopic removal of double-J stent from kidney graft ureter  under MAC    Temp: 37.6 C (99.7 F) (04/04 0828)  Temp src: Tympanic (04/04 0828)  Pulse: 80  (04/04 0846)  BP: 123/54 mmHg (04/04 0846)  Resp: 12  (04/04 0846)  SpO2: 99 % (04/04 0846)  Height: 160 cm (5\' 3" ) (04/04 0716)  Weight: 67.132 kg (148 lb) (04/04 0716)         Pain controlled.  No nausea.  MAE.    PACU course uneventful.    D/c from PACU to home.    Report Electronically signed by Jaymes Graff, MD  Attending, Dept. Of Anesthesiology  PI [id number],  Pager [phone]

## 2013-01-23 NOTE — Nurse Focus (Signed)
Huddle with patient and surgical team. Physicians in room [place], [place], [place]. Personnel in room a Leanne Sisler and r anson. Scope 15. Sheilah Mins RN

## 2013-01-23 NOTE — Discharge Instructions (Signed)
PLEASE GO TO TRANSPLANT CLINIC IMMEDIATELY AFTER YOUR VISIT TODAY TO GET REPEAT ABDOMINAL X RAY IMAGING TO CONFIRM NO FURTHER CYSTOSCOPY STENTS IN PLACE.     URINARY RETENTION AFTER DISCHARGE - If you do not void by 6 hours after your procedure, please call our clinic or go to the nearest emergency room.     BLOOD IN URINE - Light pink urine and mild burning on urination is normal after this procedure. If you develop active bleeding with blood clots or if your bladder becomes very distended without ability to urinate, please seek medical attention. If these problems persist, please contact your transplant coordinator.    MEDICATIONS  1. Resume all home medications as directed.  2. Do not drive or operate machinery while taking narcotic pain medications.    ACTIVITY Resume all previous activity as tolerated.     DIET Resume same diet prior to your hospital admission.    RETURN  Call [phone] or go to the Emergency Department near you for any symptoms such as fevers with temperature greater than 101.5 F, chills, persistent nausea and vomiting, severe pain not controlled with medications, pus drainage from the wound site, or for any acute problems or illnesses.

## 2013-01-23 NOTE — Progress Notes (Signed)
ANESTHESIA PRE OP ASSESSMENT  Date: 01/23/2013 Time: 05:35   Date of Service (Patient contact): 01/23/2013  Patient Name: Charles Galvan  34yr  10-28-1965    Consent form completed and signed by the patient: yes   Scheduled Surgery Date: 01/23/2013  Proposed Surgery: removal of his double J stent via cystoscopy.  Pre-op Dx: status post deceased donor renal transplantation on 12/22/12     HISTORY  Patient Active Problem List    Diagnosis Date Noted    Aftercare following organ transplant 01/20/2013    Long-term use of immunosuppressant medication 01/20/2013    Fever 01/20/2013    Acidosis, metabolic 01/20/2013    HTN (hypertension) 01/13/2013    Diabetes mellitus, type II 01/13/2013    Dyslipidemia 07/17/2012    GERD (gastroesophageal reflux disease) 07/17/2012    Depression 07/17/2012    Secondary hyperparathyroidism of renal origin 07/17/2012    No Known Allergies   Past Medical History   Diagnosis Date    Kidney disease     Diabetes mellitus     Hypertension      No past surgical history on file.   (Not in a hospital admission)   Social History     Occupational History    Not on file.     Social History Main Topics    Smoking status: Never Smoker     Smokeless tobacco: Never Used    Alcohol Use: No    Drug Use: No    Sexually Active: Not on file    Active Inpatient Medications         No prescriptions on file.            No family history on file.     Anesthetic Hx:  general anesthesia, denies complications             CV Tests:  EKG    Patient Prescribed a Beta Blocker? No    Labs:  Lab Results   Lab Name Value Date/Time    WHITE BLOOD CELL COUNT 3.4 01/19/2013 0934    HEMOGLOBIN 11.5 01/19/2013 0934    HEMATOCRIT 33.6 01/19/2013 0934    PLATELET COUNT 224 01/19/2013 0934     Lab Results   Lab Name Value Date/Time    SODIUM 135 01/19/2013 0934    POTASSIUM 4.6 01/19/2013 0934    CHLORIDE 106 01/19/2013 0934    CARBON DIOXIDE TOTAL 20 01/19/2013 0934    UREA NITROGEN, BLOOD (BUN) 31 01/19/2013 0934     CREATININE BLOOD 2.16 01/19/2013 0934    GLUCOSE 206 01/19/2013 0934     Lab Results   Lab Name Value Date/Time    INR 1.08 12/22/2012 0318    APTT 23.5* 12/24/2012 0445     No results found for this basename: POCPREG, PREGURINE       ROS:  CV: HTN  Resp: None  Neuro: None  Musculoskeletal: None  Med: ESRD, DM Type II    Activity: 2 flights stairs    VITAL SIGNS:  Vital Signs (Last Recorded):    BP 154/72  Pulse 92  Temp(Src) 36.5 C (97.7 F)  Resp 16  Ht 1.6 m (5\' 3" )  Wt 67.132 kg (148 lb)  BMI 26.22 kg/m2  SpO2 100%          There is no height or weight on file to calculate BSA.  There is no weight on file to calculate BMI.    PE:  Mallampati image  Mallampati Class:  2 Gr. I intubation on 12/22/2012  Oral Eval: Mouth opening normal  Neck ROM:  full  Thyroid-mentum distance in fingerbreaths: 3  Lungs:  normal  Card:  regular    ASA Status: 3 - Moderate to severe systemic disease that limits activity but not incapacitating    NPO Guidelines Met: yes      Consent:  Risks and benefits of MAC Anesthesia discussed with patient. Questions answered and patient wishes to proceed.    Impression and Anesthetic Plan: MAC Anesthesia    Jaymes Graff, MD Attending Physician

## 2013-01-23 NOTE — Progress Notes (Signed)
ANESTHESIOLOGY OPERATIVE NOTE  Date: 01/23/2013 Time: 11:36    Date of Service (Patient contact): 01/23/2013    Procedure: Cystourethroscopy,  Cystoscopic removal of double-J stent from kidney graft ureter    Anesthesia: MAC    Estimated Blood Loss: <1cc    Intravenous Fluids: Crystalloid 400 ml    Urine output: cysto case  Anesthetic Complications: No apparent anesthetic complications  Disposition: PACU, awake, in good stable condition.        ANESTHESIOLOGY ATTENDING NOTE  Date: 01/23/2013 Time: 11:36      Date of service (Patient contact): 01/23/2013    Procedure: Cystourethroscopy,  Cystoscopic removal of double-J stent from kidney graft ureter    Anesthesia: MAC    Perioperative Presence: I was physically present for entire procedure.    Concurrency: SINGLE CASE:  I was physically present for the key portion(s) of the procedure and during other times, was immediately available to return to the procedure.  The key portions of the procedure are documented below.    Key Portions:  Other Key Portions: I was physically present for the whole procedure.   Attending Signature:     Report Electronically signed by Jaymes Graff, MD, Attending Anesthesiologist, Anesthesiology and Pain Medicine, [phone]  Pager: [phone]                         The information contained on this form is true and correct to the best of my knowledge. Further, I understand that if I misrepresent, falsify or conceal information regarding my participation in the professional service described above, I may be subject to fine, imprisonment, or civil penalty under applicable federal laws.

## 2013-01-23 NOTE — Procedures (Signed)
INPATIENT OPERATION RECORD     PATIENT NAME:  Charles Galvan     MR#:  [id number]   DATE OF BIRTH:  Nov 10, 1965  DATE OF OPERATION:  01/23/2013    PREOPERATIVE DIAGNOSIS: Status post renal transplantation.   POSTOPERATIVE DIAGNOSIS: Same.     PROCEDURE: 1. Cystourethroscopy. 2. Cystoscopic removal of double-J stent from kidney graft ureter.     ANESTHESIA: Monitored anesthesia care plus topical lidocaine-containing lubricant gel.   ESTIMATED BLOOD LOSS: Less than 1 cc.   COMPLICATIONS: None.     HISTORY:    This patient has recently received a kidney transplant.  Having achieved stable renal allograft function, he was deemed ready for removal of his transplant ureteral stent which had been inserted at the time of the implantation of the kidney graft.  The patient had received a pediatric en bloc graft and had had two ureteral stents inserted (4.5 Fr x 8 cm each).  This had been document on an early postoperative abdominal film.  His pre-op KUB X-ray on 3/25 demonstrated only one stent and the patient could not remember voiding one of the stents out.  Preoperatively, informed consent was obtained.    DESCRIPTION OF SURGERY:    Patient was taken to the Operating Room and placed in supine position on the operating table.  The penis and perineal area were prepped and draped in the usual sterile fashion. After an adequate surgical pause, we injected the penile urethra with lidocaine-containing lubricant gel. Next, we entered the urethra and then the bladder under direct vision using the flexible cystoscope. The bladder and urethra were gently distended by simultaneous infusion of irrigation fluid. The urethra appeared normal. The bladder appeared normal in the visualized areas.  A thorough cystoscopy was performed and only one JJ stent was visualized.  The ureteroneocystostomy appeared pink, viable and intact.  The double-J ureteral stent was visualized, grasped with the cystoscopic grasping forceps and removed in its  entirety.    At this point, the procedure was terminated. Inspection of the stent revealed that it was intact and had been completely removed. No specimens were sent to Surgical Pathology. The patient was returned to the postanesthesia recovery room in stable condition.    DISPOSITION:    The patient is to receive a follow-up KUB X-ray postoperatively today to document the absence of stents in the bladder.    SURGEON: Faythe Ghee, M.D.; Dept. of Surgery, [place]  ASSISTANT/RESIDENT SURGEON: Nolon Stalls, M.D.; Dept. of Surgery, [facility]     Electronically signed by: Faythe Ghee, MD, [facility]      PI# [phone]  Attending Surgeon; Department of Surgery, Division of Transplantation  [facility]

## 2013-01-27 ENCOUNTER — Encounter: Payer: Self-pay | Admitting: Nephrology

## 2013-01-27 ENCOUNTER — Ambulatory Visit: Admitting: Nephrology

## 2013-01-27 ENCOUNTER — Encounter: Payer: Self-pay | Admitting: Specialist

## 2013-01-27 VITALS — BP 98/60 | HR 102 | Temp 98.2°F | Wt 147.0 lb

## 2013-01-27 DIAGNOSIS — Z940 Kidney transplant status: Secondary | ICD-10-CM | POA: Insufficient documentation

## 2013-01-27 MED ORDER — INSULIN GLARGINE 100 UNIT/ML SUB-Q VIAL
10.0000 [IU] | Freq: Every day | SUBCUTANEOUS | 11 refills | Status: DC
Start: 2013-01-27 — End: 2013-03-05
  Filled 2013-01-27: fill #0
  Filled 2013-02-13: qty 10, 28d supply, fill #0

## 2013-01-27 MED ORDER — METOPROLOL TARTRATE 25 MG TABLET
12.5000 mg | ORAL_TABLET | Freq: Every day | ORAL | 1 refills | Status: DC
Start: 2013-01-27 — End: 2013-04-08
  Filled 2013-02-13: qty 15, 30d supply, fill #0
  Filled 2013-03-17: qty 15, 30d supply, fill #1

## 2013-01-27 NOTE — Progress Notes (Signed)
TRANSPLANT NEPHROLOGY OUTPATIENT FOLLOW UP NOTE    DATE OF SERVICE:   01/27/2013    CC:   "here to see the transplant doctor".    HISTORY OF PRESENT ILLNESS:  [name] with a history of stage V chronic kidney disease in the setting of type II diabetes mellitus and hypertension who received a deceased-donor renal transplant ("imported" en-bloc kidneys from a 84-month-old, 10.8-kg donor with acute kidney injury - terminal creatinine 2.8 mg/dL) on 40/98/11.[name] is nonsensitized. 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    The immediate posttransplant course was remarkable BJY:NWGN graft function. Serum creatinine at discharge, on postoperative day 9 was 7.8 mg/dL, gross hematuria, hyperkalemia, and persistent nausea.    Past medical history includes: type II diabetes mellitus complicated by retinopathy and peripheral neuropathy, and hypertension, hyperlipidemia, depression, vascular access creation, bilateral vitrectomy, bilateral carpal tunnel release and rotator cuff surgeries.    SUBJECTIVE/REVIEW OF SYSTEMS:  [name] feels "much better" since last visit. No fevers or chills. The urinary stent was removed on 01/23/13.  He still feels weak & dizzy when he stands up fast and has mild short of breath following effort.   No cough or expectoration.  Denies chest pain or palpitations.  Appetite good. No heartburn or vomiting. Normal bowel movements.  No difficulty voiding, no burning or discomfort during micturition. Urine clear and not smelly.  Denies itching, no skin rash  Has not noted any enlarged lymph nodes.  Denies tremor, numbness or focal limb weakness.    All other systems were negative.    MEDICATIONS:  Current Outpatient Prescriptions   Medication Sig    Aspirin 81 mg DR Tablet EC Tablet Take 1 tablet by mouth every morning.    Blood Sugar Diagnostic (ACCU-CHEK AVIVA PLUS TEST STRP) Strips Use to test blood glucose 4 times daily     Cholecalciferol (VITAMIN D3) 1,000 unit Tablet Take 1 tablet by mouth every day.    Docusate (COLACE) 100 mg Capsule Take 1 capsule by mouth once daily if needed.    FamoTIDine (PEPCID) 20 mg Tablet Tablet Take 1 tablet by mouth 2 times daily.    Insulin Aspart (NOVOLOG FLEXPEN) 100 unit/mL Pen  Inject 3 times a day WITH MEALS < 70, drink juice, 70-130,0 units;131-150,1 unit;151-180,2 units; 181-210,3 units; 211-240,4 units; FSBG 241-270, 5 units; FSBG 271-300,6 units; 301-330, 7 units; 331-360,8 units; 361-400,10 units;> 400,12 units.    Insulin Glargine (LANTUS) 100 unit/mL Vial Inject 6 Units subcutaneously every day at bedtime.    Insulin Pen Needles, Disposable, (BD ULTRA-FINE III PEN NEEDLE) 31 x 3/16 " 1 each three to four times daily or as directed. for insulin administration via insulin pen    insulin syr/ndl U100 half mark (BD INSULIN SYRINGE HALF UNIT MARKINGS) 0.3 mL 31 x 5/16" Syringe use 1 syringe to inject lantus daily at bedtime    Insulin Syringe-Needle U-100 1 mL 30 x 1/2" Use to administer Procrit injection once weekly as directed    Lancets (ACCU-CHEK SOFTCLIX LANCETS) Use to test blood glucose 4 times daily    Metoclopramide (REGLAN) 5 mg Tablet Take 1 tablet by mouth 4 times daily with food. 30 minutes before each meal    Metoprolol Tartrate (LOPRESSOR) 25 mg Tablet Take 0.5 tablets by mouth 2 times daily.    Multivitamin (HEXAVITAMIN) Tablet Take 1 tablet by mouth every morning.    Mycophenolate (MMF) 250 mg Capsule Take 2 capsules by mouth  3 times daily. Or as directed. Do NOT take calcium or antacids within 2 hours of any dose. (SANDOZ ONLY; DO NOT CHANGE MFG FOR REFILLS) +++ ICD 9: V 42.0 +++    Pravastatin (PRAVACHOL) 20 mg Tablet Take 1 tablet by mouth every morning.    Sodium Bicarbonate 650 mg Tablet 1 teaspoonful of baking soda powder in 1/2 glass of water, twice a day.    Tacrolimus (PROGRAF) 1 mg Capsule Take 8 capsules by mouth 2 times daily. Or as directed by MD.  Take doses every 12 hours. Take morning dose AFTER blood draw on lab days. (SANDOZ ONLY; DO NOT CHANGE MFG FOR REFILLS) +++ ICD 9: V 42.0 +++    Trimethoprim 80 mg/Sulfamethoxazole 400 mg (BACTRIM SS) 400-80 mg Tablet Take 1 tablet by mouth every morning.    valGANciclovir (VALCYTE) 450 mg Tablet Take 1 tablet by mouth every day.     No current facility-administered medications for this visit.     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   He does not have a history of smoking, alcohol abuse or recreational drug use.    FAMILY HISTORY:  He has a family history of diabetes mellitus (mother and two sisters).   There is no family history of chronic kidney disease.    PHYSICAL EXAMINATION:  GENERAL APPEARANCE: young Hispanic male in non apparent distress.  VITAL SIGNS: BP 98/60  Pulse 102  Temp(Src) 36.8 C (98.2 F) (Tympanic)  Wt 66.7 kg (147 lb 0.8 oz)  BMI 26.05 kg/m2  SpO2 100%  EYES: PERRL, EOMI. No conjunctival pallor or scleral icterus.   ENMT: Oral mucosa moist. No thrush or ulcers. No ears or nose lesions seen.  CARDIOVASCULAR: No edema, normal heart sounds, no murmurs heard.   RESPIRATORY: Bronchovesicular breath sounds over both lower hemithoraces, no wheezes or crackles heard.  GASTROINTESTINAL: Abdomen soft, non tender, active bowel sounds heard.  GENITOURINARY: Left lower quadrant surgical incision is well healed. No tenderness to palpation over renal allograft.  Soft bruit heard over allograft site.  SKIN: No rash.    MUSCULOSKELETAL: No joint swelling, warmth or tenderness. Normal muscle bulk and tone.  LYMPHORETICULAR:  No cervical or axillary lymphadenopathy.  NERVOUS SYSTEM: Alert, oriented. No signs of meningeal irritation, no sensory or motor deficits.  PSYCH:  Appropriate affect.    LABORATORY DATA:  CHEM 20 Recent labs for the past 72 hours     01/26/13 0900    SODIUM 134    POTASSIUM  5.0    CHLORIDE 107    CARBON DIOXIDE TOTAL 17    CREATININE BLOOD 1.78    UREA NITROGEN, BLOOD (BUN) 25    GLUCOSE 278    CALCIUM 9.3    PROTEIN 7.4    ALBUMIN 3.9    ALKALINE PHOSPHATASE (ALP) 103    ASPARTATE TRANSAMINASE (AST) 9    BILIRUBIN TOTAL 0.4    ALANINE TRANSFERASE (ALT) 11    TRIGLYCERIDE --    URIC ACID, BLOOD --    LACTATE DEHYDROGENASE --    PHOSPHORUS (PO4) 3.2    CHOLESTEROL --    MAGNESIUM (MG) 1.9     BLOOD COUNTS Recent labs for the past 72 hours     01/26/13 0900    WHITE BLOOD CELL COUNT 4.3    RED CELL COUNT --    HEMOGLOBIN 10.8    HEMATOCRIT  331    MCV --    MCH --    RDW --    PLATELET COUNT 219    NUCLEATED RBC/100 WBC --    BC COMMENTS --     LAST URINALYSIS - CHEM ONLY Recent labs for the past 72 hours     01/26/13 0900    COLLECTION --    COLOR --    CLARITY --    PH URINE --    OCCULT BLOOD URINE trace    BILIRUBIN URINE --    KETONES --    GLUCOSE URINE 3+    PROTEIN URINE neg    UROBILINOGEN. --    NITRITE URINE neg    LEUK. ESTERASE neg     Lab Results   Lab Name Value Date/Time    TACROLIMUS pend 01/26/2013  9:00 AM        ASSESSMENT & PLANS:    1. Deceased-donor (en-bloc kidneys from a donor with acute kidney injury) renal transplant on 12/22/2012.   Serum creatinine continues to improve. Stent removed last week. Continue labs once a week and return to clinic in two weeks.    2. Immunosuppression  He is nonsensitized.He is on dual maintenance immunosuppressive therapy. Tacrolimus level pending.  I have not made any changes to his immunosuppressive regimen.    3. 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 He will receive valganciclovir for 33-months.  He will take trimethoprim/sulfamethoxazole, also for three months, as prophylaxis against pneumocystis pneumonia and urinary tract infections.    4. Metabolic acidosis.  He is not taking his supplemental sodium bicarbonate. I reinforced the need to take 2  teaspoons of Baking Soda BID. He told me he would start that.     5. Type II diabetes mellitus  Capillary blood glucose levels in the fasting state have been between 150 and 250 mg/dL.  I will increase the insulin glargine (Lantus) to 10 units in the evening.  He is interested in pancreas transplantation. We will discuss that further and he should be referred to the Pancreas Transplant surgeon (Dr. Alla German).    6. Hypertension  On metoprolol.While measured home blood pressure levels have ranged between 120 and 150 mmHgsystolic.  Given the low BP levels documented today and his symptoms compatible with orthostatic hypotension I told [name] to reduce dose of metoprolol to 12.5 mg once a day at bedtime.    The patient was counseled about the above recommendations and voiced understanding of the discussion.        Electronically signed by:  Gerri Spore, MD  Attending Physician  Department of Internal Medicine/ Section of Transplant Nephrology  [phone]  Pager # [phone]    01/27/2013  11:03 AM      CC: Genella Rife, MD   [facility]   [address]   [place], [place]  [address]

## 2013-01-27 NOTE — Nursing Note (Signed)
>>   LACY WOOD, MA     Tue Jan 27, 2013 10:55 AM  Vital signs obtained. Drug allergies verified. Screened for pain. Pharmacy information updated.      Nash Dimmer, MA

## 2013-01-28 ENCOUNTER — Telehealth: Payer: Self-pay | Admitting: Nephrology

## 2013-01-28 MED ORDER — TACROLIMUS 1 MG CAPSULE, IMMEDIATE-RELEASE
7.0000 mg | ORAL_CAPSULE | Freq: Two times a day (BID) | ORAL | 6 refills | Status: DC
Start: 2013-01-28 — End: 2013-02-06
  Filled 2013-01-28: fill #0

## 2013-01-28 NOTE — Telephone Encounter (Signed)
Detailed message left on patients IDd voicemail.    -------  Notes Recorded by Almeta Monas, MD on 01/28/2013 at 12:09 PM  Please decrease his dose of tacrolimus to 7 mg BID. Thanks  ------    Notes Recorded by Virgina Jock, RN on 01/27/2013 at 1:15 PM  Seen today in clinic.  FK level 14.3 with dose of 8mg  bid.

## 2013-02-06 ENCOUNTER — Telehealth: Payer: Self-pay

## 2013-02-06 MED ORDER — TACROLIMUS 1 MG CAPSULE, IMMEDIATE-RELEASE
8.0000 mg | ORAL_CAPSULE | Freq: Two times a day (BID) | ORAL | 6 refills | Status: DC
Start: 2013-02-06 — End: 2013-02-12
  Filled 2013-02-06: fill #0

## 2013-02-06 NOTE — Telephone Encounter (Signed)
Detailed instructions were left on patients voicemail.  ------  Notes Recorded by Meda Klinefelter, MD on 02/05/2013 at 1:17 PM  Please increase tacrolimus to 8 mg BID  ------    Notes Recorded by Virgina Jock, RN on 02/05/2013 at 12:53 PM  < 3 months post trx  FK dose is 7mg  bid and level is down to 7.3

## 2013-02-12 ENCOUNTER — Encounter: Payer: Self-pay | Admitting: Specialist

## 2013-02-12 ENCOUNTER — Telehealth: Payer: Self-pay | Admitting: Specialist

## 2013-02-12 MED ORDER — INSULIN SYRINGE U-100 WITH NEEDLE 0.5 ML 30 GAUGE X 5/16"
1.0000 | INJECTION | Freq: Four times a day (QID) | Status: AC | PRN
Start: 2013-02-12 — End: 2014-02-07

## 2013-02-12 MED ORDER — TACROLIMUS 1 MG CAPSULE, IMMEDIATE-RELEASE
10.0000 mg | ORAL_CAPSULE | Freq: Two times a day (BID) | ORAL | 6 refills | Status: DC
Start: 2013-02-12 — End: 2013-03-05
  Filled 2013-02-12: fill #0
  Filled 2013-02-17: qty 600, 30d supply, fill #0

## 2013-02-12 NOTE — Telephone Encounter (Signed)
Charles Galvan stands firm that he was taking 8mg  twice daily. He does admit that a few times the AM dose was slightly off schedule, but not missed.  He was able to recite new dose of 10mg  bid.    -------  Notes Recorded by Orie Fisherman, MD on 02/12/2013 at 3:48 PM  There is something wrong about the amount he is taking - please check with patient  Increase from 8/8 to 10/10  MG  -  ------    Notes Recorded by Virgina Jock, RN on 02/12/2013 at 3:40 PM  FK level 4.6 with dose of 8mg  bid. He denies new meds or missed doses.  ------    Notes Recorded by Orie Fisherman, MD on 02/12/2013 at 10:48 AM  Iron, TIBC, ferritin with next blood draw - mG  ------    Notes Recorded by Virgina Jock, RN on 02/12/2013 at 10:43 AM  H/H trending down.    FK level pending- dose was increased last week to 8mg  bid.

## 2013-02-13 ENCOUNTER — Other Ambulatory Visit: Payer: Self-pay | Admitting: Surgery

## 2013-02-13 ENCOUNTER — Other Ambulatory Visit: Payer: Self-pay

## 2013-02-13 LAB — OUTSIDE LABS
Alanine Transferase (ALT): 9
Albumin: 4
Alkaline Phosphatase (ALP): 109
Aspartate Transaminase (AST): 8
Bilirubin Total: 0.3
Calcium: 9.3
Carbon Dioxide Total: 21
Chloride: 109
Creatine Kinase: 24
Creatinine Blood: 1.92
Creatinine Spot Urine: 196
Glucose: 127
Hematocrit: 27.7
Hemoglobin: 9.3
Leuk. Esterase: NEGATIVE
Magnesium (Mg): 1.9
Neutrophils % Auto: 86
Neutrophils Abs Auto: 3096
Nitrite Urine: NEGATIVE
Phosphorus (PO4): 2.7
Platelet Count: 176
Potassium: 4.7
Protein Total Spot Urine: 34
Protein: 7.2
Sodium: 137
Specific Gravity: 1.021
Tacrolimus (FK506): 7.3
Urea Nitrogen, Blood (BUN): 24
White Blood Cell Count: 3.6

## 2013-02-13 MED ORDER — DOCUSATE SODIUM 100 MG CAPSULE
100.0000 mg | ORAL_CAPSULE | Freq: Two times a day (BID) | ORAL | 2 refills | Status: DC
Start: 2013-02-13 — End: 2013-04-08
  Filled 2013-02-13: qty 100, 50d supply, fill #0

## 2013-02-17 ENCOUNTER — Ambulatory Visit: Admitting: Nephrology

## 2013-02-17 ENCOUNTER — Encounter: Payer: Self-pay | Admitting: Nephrology

## 2013-02-17 ENCOUNTER — Other Ambulatory Visit: Payer: Self-pay

## 2013-02-17 VITALS — BP 92/58 | HR 113 | Temp 98.1°F | Wt 144.2 lb

## 2013-02-17 NOTE — Progress Notes (Signed)
TRANSPLANT NEPHROLOGY OUTPATIENT FOLLOW UP NOTE    DATE OF SERVICE:   02/17/2013    CC:   "here to follow up with the transplant doctor".    HISTORY OF PRESENT ILLNESS:  [name] with a history of stage V chronic kidney disease in the setting of type II diabetes mellitus and hypertension who received a deceased-donor renal transplant ("imported" en-bloc kidneys from a 22-month-old, 10.8-kg donor with acute kidney injury - terminal creatinine 2.8 mg/dL) on 16/10/96.[name] is nonsensitized. 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 His posttransplant course was remarkable forslow graft function, gross hematuria, hyperkalemia, and persistent nausea.    Past medical history includes: type II diabetes mellitus complicated by retinopathy and peripheral neuropathy, and hypertension, hyperlipidemia, depression, vascular access creation, bilateral vitrectomy, bilateral carpal tunnel release and rotator cuff surgeries.    SUBJECTIVE/REVIEW OF SYSTEMS:  [name] reported few days of diarrhea and "stomach upset". He stated that symptoms started after he ate a "chilli dog" that may not have been well cooked.  No fevers or chills. The urinary stent was removed on 01/23/13.  He sometimes still feels dizzy when he stands up fast.   No cough or expectoration.  Denies chest pain or palpitations.  Appetite good. No heartburn or vomiting.   No difficulty voiding, no burning or discomfort during micturition. Urine clear and not smelly.  Denies itching, no skin rash  Has not noted any enlarged lymph nodes.  Denies tremor, numbness or focal limb weakness.    All other systems were negative.    MEDICATIONS:  Current Outpatient Prescriptions   Medication Sig    Aspirin 81 mg DR Tablet EC Tablet Take 1 tablet by mouth every morning.    Blood Sugar Diagnostic (ACCU-CHEK AVIVA PLUS TEST STRP) Strips Use to test blood glucose 4 times daily    Cholecalciferol (VITAMIN D3) 1,000  unit Tablet Take 1 tablet by mouth every day.    Docusate (COLACE) 100 mg Capsule Take 1 capsule by mouth once daily if needed.    Docusate (COLACE) 100 mg Capsule Take 1 capsule by mouth 2 times daily.    FamoTIDine (PEPCID) 20 mg Tablet Tablet Take 1 tablet by mouth 2 times daily.    Insulin Aspart (NOVOLOG FLEXPEN) 100 unit/mL Pen  Inject 3 times a day WITH MEALS < 70, drink juice, 70-130,0 units;131-150,1 unit;151-180,2 units; 181-210,3 units; 211-240,4 units; FSBG 241-270, 5 units; FSBG 271-300,6 units; 301-330, 7 units; 331-360,8 units; 361-400,10 units;> 400,12 units.    Insulin Glargine (LANTUS) 100 unit/mL Vial Inject 10 Units subcutaneously every day at bedtime.    Insulin Pen Needles, Disposable, (BD ULTRA-FINE III PEN NEEDLE) 31 x 3/16 " 1 each three to four times daily or as directed. for insulin administration via insulin pen    insulin syr/ndl U100 half mark (BD INSULIN SYRINGE HALF UNIT MARKINGS) 0.3 mL 31 x 5/16" Syringe use 1 syringe to inject lantus daily at bedtime    Insulin Syringe-Needle U-100 (INSULIN SYRINGE) 1/2 mL 30 x 5/16" 1 syringe three to four times daily or as directed. for insulin administration    Insulin Syringe-Needle U-100 1 mL 30 x 1/2" Use to administer Procrit injection once weekly as directed    Lancets (ACCU-CHEK SOFTCLIX LANCETS) Use to test blood glucose 4 times daily    Metoclopramide (REGLAN) 5 mg Tablet Take 1 tablet by mouth 4 times daily with food. 30 minutes before each meal    Metoprolol  Tartrate (LOPRESSOR) 25 mg Tablet Take 0.5 tablet (1/2 tablet) by mouth every day at bedtime.    Multivitamin (HEXAVITAMIN) Tablet Take 1 tablet by mouth every morning.    Mycophenolate (MMF) 250 mg Capsule Take 2 capsules by mouth 3 times daily. Or as directed. Do NOT take calcium or antacids within 2 hours of any dose. (SANDOZ ONLY; DO NOT CHANGE MFG FOR REFILLS) +++ ICD 9: V 42.0 +++    Pravastatin (PRAVACHOL) 20 mg Tablet Take 1 tablet by mouth every morning.     Sodium Bicarbonate 650 mg Tablet 1 teaspoonful of baking soda powder in 1/2 glass of water, twice a day.    Tacrolimus (PROGRAF) 1 mg Capsule Take 10 capsules by mouth 2 times daily. Or as directed by MD. Take doses every 12 hours. Take morning dose AFTER blood draw on lab days. (SANDOZ ONLY; DO NOT CHANGE MFG FOR REFILLS) +++ ICD 9: V 42.0 +++    Trimethoprim 80 mg/Sulfamethoxazole 400 mg (BACTRIM SS) 400-80 mg Tablet Take 1 tablet by mouth every morning.    valGANciclovir (VALCYTE) 450 mg Tablet Take 1 tablet by mouth every day.     No current facility-administered medications for this visit.     ALLERGIES  No Known Allergies     PERSONAL/SOCIAL HISTORY & FAMILY HISTORY:  Reviewed and non-contributory to this visit.    PHYSICAL EXAMINATION:  GENERAL APPEARANCE: young Hispanic male in non apparent distress.  VITAL SIGNS: BP 92/58  Pulse 113  Temp(Src) 36.7 C (98.1 F) (Tympanic)  Wt 65.4 kg (144 lb 2.9 oz)  BMI 25.55 kg/m2  EYES: PERRL, EOMI. No conjunctival pallor or scleral icterus.   ENMT: Oral mucosa moist. No thrush or ulcers. No ears or nose lesions seen.  CARDIOVASCULAR: No edema, normal heart sounds, no murmurs heard.   RESPIRATORY: Bronchovesicular breath sounds over both lower hemithoraces, no wheezes or crackles heard.  GASTROINTESTINAL: Abdomen soft, non tender, active bowel sounds heard.  GENITOURINARY: Left lower quadrant surgical incision is well healed. No tenderness to palpation over renal allograft.  Soft bruit heard over allograft site.  SKIN: No rash.    MUSCULOSKELETAL: No joint swelling, warmth or tenderness. Normal muscle bulk and tone.  LYMPHORETICULAR:  No cervical or axillary lymphadenopathy.  NERVOUS SYSTEM: Alert, oriented. No signs of meningeal irritation, no sensory or motor deficits.  PSYCH:  Appropriate affect.    LABORATORY DATA:C  From 02/11/2013 showed: BUN 17, creatinine 1.44 (down from 1.92), Potassium 4.9, bicarb 17, glucose 145, Phos 2.7, Calcium 9.2, WBC 6,400, Hb  8.4.  Tacrolimus 4.6 ng/ml (dose adjusted since). Urinalysis significant for 1+ glucose.       ASSESSMENT:    1. Deceased-donor (en-bloc kidneys from a donor with acute kidney injury) renal transplant on 12/22/2012.   Serum creatinine continues to improve. Continue labs once a week and return to clinic in two weeks.    2. Immunosuppression  He is nonsensitized.He is on dual maintenance immunosuppressive therapy. Tacrolimus level to be repeated tomorrow (target 10 - 12 ng/ml).  I have not made any changes to his immunosuppressive regimen.    3. 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 He will receive valganciclovir for 17-months.  He will take trimethoprim/sulfamethoxazole, also for three months, as prophylaxis against pneumocystis pneumonia and urinary tract infections.    4. Metabolic acidosis.  He is not taking his supplemental sodium bicarbonate. I reinforced the need to take 2 teaspoons of Baking Soda BID. He told  me he would start that.     5. Type II diabetes mellitus  Capillary blood glucose levels in the fasting state have been between 150 and 250 mg/dL.  He is interested in pancreas transplantation. We will refer to the Pancreas Transplant surgeon (Dr. Alla German).    6. Hypertension  On low-dose of metoprolol. Stable for now.    7. Anemia.  Started on Ferrous Sulfate.    The patient was counseled about the above recommendations and voiced understanding of the discussion.      Electronically signed by:  Gerri Spore, MD  Attending Physician  Department of Internal Medicine/ Section of Transplant Nephrology  [phone]  Pager # [phone]    02/17/2013  12:40 PM        CC: Genella Rife, MD   [facility]   [address], [address]   [place], [place]  [address]       Genelle Bal, MD   [address] [place], [place]  [address]

## 2013-02-18 ENCOUNTER — Other Ambulatory Visit: Payer: Self-pay

## 2013-02-19 ENCOUNTER — Other Ambulatory Visit: Payer: Self-pay | Admitting: Surgery

## 2013-02-19 ENCOUNTER — Other Ambulatory Visit: Payer: Self-pay

## 2013-02-19 ENCOUNTER — Encounter: Payer: Self-pay | Admitting: Nephrology

## 2013-02-19 MED FILL — water for irrigation, sterile solution: Qty: 500 | Status: AC

## 2013-02-19 MED FILL — lidocaine 2 % mucosal jelly in applicator: Qty: 5 | Status: AC

## 2013-02-19 MED FILL — sodium chloride 0.9 % intravenous solution: INTRAVENOUS | Qty: 1000 | Status: AC

## 2013-02-20 ENCOUNTER — Other Ambulatory Visit: Payer: Self-pay | Admitting: Specialist

## 2013-02-25 ENCOUNTER — Encounter: Payer: Self-pay | Admitting: Specialist

## 2013-03-02 ENCOUNTER — Encounter: Payer: Self-pay | Admitting: Nephrology

## 2013-03-02 ENCOUNTER — Ambulatory Visit: Admitting: Nephrology

## 2013-03-02 VITALS — BP 94/60 | HR 75 | Temp 96.4°F | Resp 16 | Ht 63.0 in | Wt 142.5 lb

## 2013-03-02 NOTE — Nursing Note (Signed)
>>   Merlyn Albert, NP     Mon Mar 02, 2013 12:41 PM  Pt seen in [facility] for routine f/u, s/p kidney transplant, by Dr Barry Brunner. Ongoing c/o abd. Cramps and frequent BM. Reviewed with MD.

## 2013-03-05 DIAGNOSIS — K3184 Gastroparesis: Secondary | ICD-10-CM | POA: Insufficient documentation

## 2013-03-05 MED ORDER — MYCOPHENOLATE MOFETIL 250 MG CAPSULE
500.0000 mg | ORAL_CAPSULE | Freq: Two times a day (BID) | ORAL | 11 refills | Status: DC
Start: 2013-03-05 — End: 2013-04-12
  Filled 2013-03-17: qty 120, 30d supply, fill #0

## 2013-03-05 MED ORDER — METOCLOPRAMIDE 5 MG TABLET
2.5000 mg | ORAL_TABLET | Freq: Three times a day (TID) | ORAL | Status: AC
Start: 2013-03-05 — End: 2013-04-04

## 2013-03-05 MED ORDER — TACROLIMUS 1 MG CAPSULE, IMMEDIATE-RELEASE
9.0000 mg | ORAL_CAPSULE | Freq: Two times a day (BID) | ORAL | 6 refills | Status: DC
Start: 2013-03-05 — End: 2013-03-09

## 2013-03-05 NOTE — Progress Notes (Signed)
TRANSPLANT NEPHROLOGY OUTPATIENT FOLLOW UP NOTE    DATE OF SERVICE:   03/02/2013    CC:   "here to see the transplant doctor".    HISTORY OF PRESENT ILLNESS:  [name] with a history of stage V chronic kidney disease in the setting of type II diabetes mellitusand hypertension who received a deceased-donor renal transplant (en-bloc kidneys from a 76-month-old, 10.8-kg donor with acute kidney injury - terminal creatinine 2.8 mg/dL) on 57/84/69.[name] is nonsensitized. 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 His posttransplant course was remarkable forslow graft function, gross hematuria, hyperkalemia, and persistent nausea.    Past medical history includes: type II diabetes mellitus complicated by retinopathy and peripheral neuropathy, and hypertension, hyperlipidemia, depression, vascular access creation, bilateral vitrectomy, bilateral carpal tunnel release and rotator cuff surgeries.    SUBJECTIVE/REVIEW OF SYSTEMS:  [name] reports few days of diarrhea of 10 loose bowel movements a day. No blood or mucous in the stool. No abdominal cramps.  No fevers or chills.   He still has some postural hypotension.   No cough or expectoration.  Denies chest pain or palpitations.  Appetite good. No heartburn or vomiting.   No difficulty voiding, no burning or discomfort during micturition.   Denies itching, no skin rash  Has not noted any enlarged lymph nodes.  Denies major tremor, numbness or focal limb weakness.    All other systems were negative.    MEDICATIONS:  Current Outpatient Prescriptions   Medication Sig    Aspirin 81 mg DR Tablet EC Tablet Take 1 tablet by mouth every morning.    Blood Sugar Diagnostic (ACCU-CHEK AVIVA PLUS TEST STRP) Strips Use to test blood glucose 4 times daily    Cholecalciferol (VITAMIN D3) 1,000 unit Tablet Take 1 tablet by mouth every day.    Docusate (COLACE) 100 mg Capsule Take 1 capsule by mouth once daily if needed.     FamoTIDine (PEPCID) 20 mg Tablet Tablet Take 1 tablet by mouth 2 times daily.    ferrous sulfate 325 mg (65 mg iron) Tablet Take 1 tablet by mouth 2 times daily with meals.    Insulin Aspart (NOVOLOG FLEXPEN) 100 unit/mL Pen  Inject 3 times a day WITH MEALS < 70, drink juice, 70-130,0 units;131-150,1 unit;151-180,2 units; 181-210,3 units; 211-240,4 units; FSBG 241-270, 5 units; FSBG 271-300,6 units; 301-330, 7 units; 331-360,8 units; 361-400,10 units;> 400,12 units.    Insulin Glargine (LANTUS) 100 unit/mL Vial Inject 10 Units subcutaneously every day at bedtime.    Insulin Pen Needles, Disposable, (BD ULTRA-FINE III PEN NEEDLE) 31 x 3/16 " 1 each three to four times daily or as directed. for insulin administration via insulin pen    insulin syr/ndl U100 half mark (BD INSULIN SYRINGE HALF UNIT MARKINGS) 0.3 mL 31 x 5/16" Syringe use 1 syringe to inject lantus daily at bedtime    Insulin Syringe-Needle U-100 (INSULIN SYRINGE) 1/2 mL 30 x 5/16" 1 syringe three to four times daily or as directed. for insulin administration    Insulin Syringe-Needle U-100 1 mL 30 x 1/2" Use to administer Procrit injection once weekly as directed    Lancets (ACCU-CHEK SOFTCLIX LANCETS) Use to test blood glucose 4 times daily    Metoclopramide (REGLAN) 5 mg Tablet Take 1 tablet by mouth 4 times daily with food. 30 minutes before each meal    Metoprolol Tartrate (LOPRESSOR) 25 mg Tablet Take 0.5 tablet (1/2 tablet) by mouth every day at bedtime.  Multivitamin (HEXAVITAMIN) Tablet Take 1 tablet by mouth every morning.    Mycophenolate (MMF) 250 mg Capsule Take 2 capsules by mouth 3 times daily. Or as directed. Do NOT take calcium or antacids within 2 hours of any dose. (SANDOZ ONLY; DO NOT CHANGE MFG FOR REFILLS) +++ ICD 9: V 42.0 +++    Pravastatin (PRAVACHOL) 20 mg Tablet Take 1 tablet by mouth every morning.    Tacrolimus (PROGRAF) 1 mg Capsule Take 10 capsules by mouth 2 times daily. Or as directed by MD. Take doses  every 12 hours. Take morning dose AFTER blood draw on lab days. (SANDOZ ONLY; DO NOT CHANGE MFG FOR REFILLS) +++ ICD 9: V 42.0 +++    Trimethoprim 80 mg/Sulfamethoxazole 400 mg (BACTRIM SS) 400-80 mg Tablet Take 1 tablet by mouth every morning.    valGANciclovir (VALCYTE) 450 mg Tablet Take 1 tablet by mouth every day.    [DISCONTINUED] Docusate (COLACE) 100 mg Capsule Take 1 capsule by mouth 2 times daily.     No current facility-administered medications for this visit.     ALLERGIES  No Known Allergies     PERSONAL/SOCIAL HISTORY & FAMILY HISTORY:  Reviewed and non-contributory to this visit.    PHYSICAL EXAMINATION:  GENERAL APPEARANCE: young Hispanic male in non apparent distress. Well dressed and groomed.  VITAL SIGNS: BP 94/60  Pulse 75  Temp(Src) 35.8 C (96.4 F) (Tympanic)  Resp 16  Ht 1.6 m (5\' 3" )  Wt 64.638 kg (142 lb 8 oz)  BMI 25.25 kg/m2  EYES: PERRL, EOMI. No conjunctival pallor or scleral icterus.   ENMT: Oral mucosa moist. No thrush or ulcers. No ears or nose lesions seen.  CARDIOVASCULAR: No edema, normal heart sounds, no murmurs heard.   RESPIRATORY: normal breath sounds over both lower hemithoraces, no wheezes or crackles heard.  GASTROINTESTINAL: Abdomen soft, non tender, active bowel sounds heard.  GENITOURINARY: Left lower quadrant surgical incision is well healed. No tenderness to palpation over renal allograft.  Soft bruit heard over allograft site.  SKIN: No rash.    MUSCULOSKELETAL: No joint swelling, warmth or tenderness. Normal muscle bulk and tone.  LYMPHORETICULAR:  No cervical or axillary lymphadenopathy.  NERVOUS SYSTEM: Alert, oriented. No signs of meningeal irritation, no sensory or motor deficits.  PSYCH:  Appropriate affect.    LABORATORY DATA:    From 02/24/2013 showed: BUN 17, creatinine 1.49, Potassium 4.6, bicarb 15, glucose 201, Phos 3.6, Calcium 9.5, WBC 8,100, Hb 8.2.  Tacrolimus 10.9 ng/ml. Urinalysis significant for 1+ glucose. Urine protein to creatinine ratio  0.138       ASSESSMENT:    1. Deceased-donor (en-bloc kidneys from a donor with acute kidney injury) renal transplant on 12/22/2012.   Serum creatinine is stable to improved. Continue labs once a week and return to clinic in three weeks.    2. Immunosuppression  He is nonsensitized.He is on dual maintenance immunosuppressive therapy. Tacrolimus level to be repeated tomorrow (target 10 - 12 ng/ml).  Due to his persistent diarrhea I will reduce his dose of mycophenolate to 500 mg BID.    3. 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 He will receive valganciclovir for 46-months.  He will take trimethoprim/sulfamethoxazole, also for three months, as prophylaxis against pneumocystis pneumonia and urinary tract infections.    4. Metabolic acidosis.  Probably related to loss of bicarbonate with diarrhea. He is not taking his supplemental sodium bicarbonate. I reinforced the need to take 2 teaspoons  of Baking Soda BID.     5. Type II diabetes mellitus  Managed by his PCP. Capillary blood glucose levels in the fasting state have been between 150 and 250 mg/dL.    6. Hypertension  On low-dose of metoprolol. Stable for now.    7. Anemia.  Started on Ferrous Sulfate.    8. Diarrhea.  I will reduce his dose of reglan to 2.5 mg tid and reduce his dose of mycophenolate to 500 mg BID. If diarrhea not better by the end of week we will have to check his stools for C. Diff and other pathogens.    The patient was counseled about the above recommendations and voiced understanding of the discussion.      Electronically signed by:  Gerri Spore, MD  Attending Physician  Department of Internal Medicine/ Section of Transplant Nephrology  [phone]  Pager # [phone]    03/02/2013  11:37 AM      CC: Genella Rife, MD   [facility]   [address]   [place], [place]  [address]       Genelle Bal, MD   [address], [address] [place], [place]  [address]

## 2013-03-06 ENCOUNTER — Encounter: Payer: Self-pay | Admitting: Nephrology

## 2013-03-09 ENCOUNTER — Other Ambulatory Visit: Payer: Self-pay

## 2013-03-09 ENCOUNTER — Telehealth: Payer: Self-pay | Admitting: Nephrology

## 2013-03-09 MED ORDER — TACROLIMUS 1 MG CAPSULE, IMMEDIATE-RELEASE
8.0000 mg | ORAL_CAPSULE | Freq: Two times a day (BID) | ORAL | 6 refills | Status: DC
Start: 2013-03-09 — End: 2013-04-12
  Filled 2013-03-09 – 2013-03-17 (×2): qty 480, 30d supply, fill #0

## 2013-03-09 NOTE — Telephone Encounter (Signed)
Detailed message was left on IDd voicemail.    ------  Notes Recorded by Almeta Monas, MD on 03/09/2013 at 1:01 PM  Please decrease the dose of tacrolimus to 8 mg BID. Thanks  ------    Notes Recorded by Virgina Jock, RN on 03/09/2013 at 12:49 PM  FK level 15.7 with 9mg  bid

## 2013-03-13 ENCOUNTER — Other Ambulatory Visit: Payer: Self-pay

## 2013-03-13 ENCOUNTER — Other Ambulatory Visit: Payer: Self-pay | Admitting: Nephrology

## 2013-03-13 MED ORDER — INSULIN GLARGINE 100 UNIT/ML SUB-Q VIAL
SUBCUTANEOUS | 2 refills | Status: DC
Start: 2013-03-13 — End: 2013-06-03
  Filled 2013-03-13: fill #0
  Filled 2013-03-17: qty 10, 28d supply, fill #0
  Filled 2013-04-14: qty 10, 28d supply, fill #1
  Filled 2013-05-11: qty 10, 28d supply, fill #2

## 2013-03-13 MED ORDER — METOCLOPRAMIDE 5 MG TABLET
2.5000 mg | ORAL_TABLET | Freq: Three times a day (TID) | ORAL | 3 refills | Status: DC
Start: 2013-03-13 — End: 2013-04-12
  Filled 2013-03-13: fill #0
  Filled 2013-03-17: qty 45, 30d supply, fill #0

## 2013-03-17 ENCOUNTER — Other Ambulatory Visit: Payer: Self-pay

## 2013-03-19 LAB — OUTSIDE LABS
Alanine Transferase (ALT): 9
Albumin: 3.9
Alkaline Phosphatase (ALP): 94
Aspartate Transaminase (AST): 10
Bilirubin Total: 0.2
Calcium: 9.3
Carbon Dioxide Total: 22
Chloride: 100
Creatine Kinase: 36
Creatinine Blood: 1.42
Creatinine Spot Urine: 93.2
Glucose: 315
Hematocrit: 30.9
Hemoglobin: 9.3
Leuk. Esterase: NEGATIVE
Magnesium (Mg): 1.6
Neutrophils % Auto: 87
Neutrophils Abs Auto: 5.3
Nitrite Urine: NEGATIVE
Occult Blood Urine: NEGATIVE
Phosphorus (PO4): 3
Platelet Count: 289
Potassium: 5.2
Protein Total Spot Urine: 6
Protein Urine: NEGATIVE
Protein: 6.5
Sodium: 136
Specific Gravity: 1.022
Tacrolimus (FK506): 9.9
Urea Nitrogen, Blood (BUN): 19
White Blood Cell Count: 6.2

## 2013-03-27 ENCOUNTER — Encounter: Payer: Self-pay | Admitting: Nephrology

## 2013-03-31 LAB — OUTSIDE LABS
Alanine Transferase (ALT): 8
Albumin: 4.2
Alkaline Phosphatase (ALP): 99
Aspartate Transaminase (AST): 15
Bilirubin Total: 0.2
Calcium: 9.6
Carbon Dioxide Total: 18
Chloride: 100
Creatine Kinase: 48
Creatinine Blood: 1.39
Creatinine Spot Urine: 129.3
Glucose: 276
Hematocrit: 33.4
Hemoglobin: 10.2
Leuk. Esterase: NEGATIVE
Magnesium (Mg): 1.8
Neutrophils % Auto: 87
Neutrophils Abs Auto: 5.7
Nitrite Urine: NEGATIVE
Occult Blood Urine: NEGATIVE
Phosphorus (PO4): 3.9
Platelet Count: 282
Potassium: 5.1
Protein Total Spot Urine: 16
Protein Urine: NEGATIVE
Protein: 6.9
Sodium: 136
Specific Gravity: 1.029
Tacrolimus (FK506): 13.5
Urea Nitrogen, Blood (BUN): 22
White Blood Cell Count: 6.7

## 2013-04-03 ENCOUNTER — Ambulatory Visit: Payer: Self-pay

## 2013-04-03 ENCOUNTER — Encounter: Payer: Self-pay | Admitting: Specialist

## 2013-04-06 ENCOUNTER — Ambulatory Visit: Admitting: Nephrology

## 2013-04-06 ENCOUNTER — Encounter: Payer: Self-pay | Admitting: Nephrology

## 2013-04-06 VITALS — BP 109/73 | HR 97 | Temp 97.0°F | Resp 16 | Ht 63.0 in | Wt 148.0 lb

## 2013-04-06 NOTE — Nursing Note (Signed)
>>   Merlyn Albert, NP     Mon Apr 06, 2013 11:28 AM  Pt seen for post transplant care in [facility] office, by [facility].

## 2013-04-08 ENCOUNTER — Other Ambulatory Visit: Payer: Self-pay | Admitting: Nephrology

## 2013-04-08 ENCOUNTER — Other Ambulatory Visit: Payer: Self-pay

## 2013-04-10 MED ORDER — DOCUSATE SODIUM 100 MG CAPSULE
100.0000 mg | ORAL_CAPSULE | Freq: Every day | ORAL | 2 refills | Status: AC | PRN
Start: 2013-04-10 — End: 2013-06-10
  Filled 2013-04-10: fill #0
  Filled 2013-04-14: qty 60, 30d supply, fill #0

## 2013-04-10 MED ORDER — METOPROLOL TARTRATE 25 MG TABLET
12.5000 mg | ORAL_TABLET | Freq: Every day | ORAL | 6 refills | Status: DC
Start: 2013-04-08 — End: 2013-10-26
  Filled 2013-04-10: fill #0
  Filled 2013-04-14: qty 15, 30d supply, fill #0
  Filled 2013-05-11: qty 15, 30d supply, fill #1
  Filled 2013-06-09: qty 15, 30d supply, fill #2
  Filled 2013-07-08: qty 15, 30d supply, fill #3
  Filled 2013-08-04: qty 15, 30d supply, fill #4
  Filled 2013-09-03: qty 15, 30d supply, fill #5
  Filled 2013-09-29: qty 15, 30d supply, fill #6

## 2013-04-12 MED ORDER — TACROLIMUS 1 MG CAPSULE, IMMEDIATE-RELEASE
7.0000 mg | ORAL_CAPSULE | Freq: Two times a day (BID) | ORAL | 6 refills | Status: DC
Start: 2013-04-12 — End: 2013-06-04
  Filled 2013-04-12: fill #0
  Filled 2013-04-14: qty 420, 30d supply, fill #0
  Filled 2013-05-11: qty 420, 30d supply, fill #1

## 2013-04-12 MED ORDER — MYCOPHENOLATE MOFETIL 250 MG CAPSULE
750.0000 mg | ORAL_CAPSULE | Freq: Two times a day (BID) | ORAL | 11 refills | Status: DC
Start: 2013-04-12 — End: 2014-03-09
  Filled 2013-04-12: fill #0
  Filled 2013-04-14: qty 180, 30d supply, fill #0
  Filled 2013-05-11: qty 180, 30d supply, fill #1
  Filled 2013-06-09: qty 180, 30d supply, fill #2
  Filled 2013-07-08: qty 180, 30d supply, fill #3
  Filled 2013-08-04: qty 180, 30d supply, fill #4
  Filled 2013-09-03: qty 180, 30d supply, fill #5
  Filled 2013-09-29: qty 180, 30d supply, fill #6
  Filled 2013-10-28: qty 180, 30d supply, fill #7
  Filled 2013-12-01: qty 180, 30d supply, fill #8
  Filled 2013-12-24: qty 180, 30d supply, fill #9
  Filled 2014-01-18: qty 180, 30d supply, fill #10
  Filled 2014-02-08 – 2014-02-15 (×2): qty 180, 30d supply, fill #11

## 2013-04-12 NOTE — Progress Notes (Signed)
TRANSPLANT NEPHROLOGY OUTPATIENT FOLLOW UP NOTE    DATE OF SERVICE:   04/06/2013    CC:   "here to see the transplant doctor".    HISTORY OF PRESENT ILLNESS:  [name] with a history of stage V chronic kidney disease in the setting of type II diabetes mellitusand hypertension who received a deceased-donor renal transplant (en-bloc kidneys from a 56-month-old, 10.8-kg donor with acute kidney injury - terminal creatinine 2.8 mg/dL) on 16/10/96.[name] is nonsensitized. 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 His posttransplant course was remarkable forslow graft function, gross hematuria, hyperkalemia, and persistent nausea. These have resolved and he is in clinic today for a regular follow up.    Past medical history includes: type II diabetes mellitus complicated by retinopathy and peripheral neuropathy, and hypertension, hyperlipidemia, depression, vascular access creation, bilateral vitrectomy, bilateral carpal tunnel release and rotator cuff surgeries.    SUBJECTIVE/REVIEW OF SYSTEMS:  He is "feeling great" and has gone back to the Gym three times a week.  No fevers or chills.   He still has some postural hypotension.   No cough or expectoration.  Denies chest pain or palpitations.  Appetite good. No heartburn or vomiting.   No difficulty voiding, no burning or discomfort during micturition.   Denies itching, no skin rash  Has not noted any enlarged lymph nodes.  Denies major tremor, numbness or focal limb weakness.    All other systems were negative.    MEDICATIONS:  Current Outpatient Prescriptions   Medication Sig    Aspirin 81 mg DR Tablet EC Tablet Take 1 tablet by mouth every morning.    Blood Sugar Diagnostic (ACCU-CHEK AVIVA PLUS TEST STRP) Strips Use to test blood glucose 4 times daily    Cholecalciferol (VITAMIN D3) 1,000 unit Tablet Take 1 tablet by mouth every day.    Docusate (COLACE) 100 mg Capsule Take 1 capsule by mouth once daily  if needed.    FamoTIDine (PEPCID) 20 mg Tablet Tablet Take 1 tablet by mouth every morning.    ferrous sulfate 325 mg (65 mg iron) Tablet Take 1 tablet by mouth 2 times daily with meals.    Insulin Aspart (NOVOLOG FLEXPEN) 100 unit/mL Pen  Inject 3 times a day WITH MEALS < 70, drink juice, 70-130,0 units;131-150,1 unit;151-180,2 units; 181-210,3 units; 211-240,4 units; FSBG 241-270, 5 units; FSBG 271-300,6 units; 301-330, 7 units; 331-360,8 units; 361-400,10 units;> 400,12 units.    Insulin Glargine (LANTUS) 100 unit/mL Vial 8 units in am and 7 units in the evenings.    Insulin Pen Needles, Disposable, (BD ULTRA-FINE III PEN NEEDLE) 31 x 3/16 " 1 each three to four times daily or as directed. for insulin administration via insulin pen    insulin syr/ndl U100 half mark (BD INSULIN SYRINGE HALF UNIT MARKINGS) 0.3 mL 31 x 5/16" Syringe use 1 syringe to inject lantus daily at bedtime    Insulin Syringe-Needle U-100 (INSULIN SYRINGE) 1/2 mL 30 x 5/16" 1 syringe three to four times daily or as directed. for insulin administration    Insulin Syringe-Needle U-100 1 mL 30 x 1/2" Use to administer Procrit injection once weekly as directed    Lancets (ACCU-CHEK SOFTCLIX LANCETS) Use to test blood glucose 4 times daily    Metoprolol Tartrate (LOPRESSOR) 25 mg Tablet Take 0.5 tablet (1/2 tablet) by mouth every day at bedtime.    Multivitamin (HEXAVITAMIN) Tablet Take 1 tablet by mouth every morning.    Mycophenolate (  MMF) 250 mg Capsule Take 2 capsules by mouth 2 times daily. Or as directed. Do NOT take calcium or antacids within 2 hours of any dose. (SANDOZ ONLY; DO NOT CHANGE MFG FOR REFILLS) +++ ICD 9: V 42.0 +++    Pravastatin (PRAVACHOL) 20 mg Tablet Take 1 tablet by mouth every morning.    Tacrolimus (PROGRAF) 1 mg Capsule Take 8 capsules by mouth 2 times daily. Or as directed by MD. Take doses every 12 hours. Take morning dose AFTER blood draw on lab days. (SANDOZ ONLY; DO NOT CHANGE MFG FOR REFILLS) +++ ICD  9: V 42.0 +++     No current facility-administered medications for this visit.     ALLERGIES  No Known Allergies     PERSONAL/SOCIAL HISTORY & FAMILY HISTORY:  Reviewed and non-contributory to this visit.    PHYSICAL EXAMINATION:  GENERAL APPEARANCE: young Hispanic male in non apparent distress. Well dressed and groomed.  VITAL SIGNS: BP 109/73  Pulse 97  Temp(Src) 36.1 C (97 F) (Tympanic)  Resp 16  Ht 1.6 m (5\' 3" )  Wt 67.132 kg (148 lb)  BMI 26.22 kg/m2  EYES: PERRL, EOMI. No conjunctival pallor or scleral icterus.   ENMT: Oral mucosa moist. No thrush or ulcers. No ears or nose lesions seen.  CARDIOVASCULAR: No edema, normal heart sounds, no murmurs heard.   RESPIRATORY: normal breath sounds over both lower hemithoraces, no wheezes or crackles heard.  GASTROINTESTINAL: Abdomen soft, non tender, active bowel sounds heard.  GENITOURINARY: Left lower quadrant surgical incision is well healed. No tenderness to palpation over renal allograft.  Soft bruit heard over allograft site.  SKIN: No rash.    MUSCULOSKELETAL: No joint swelling, warmth or tenderness. Normal muscle bulk and tone.  LYMPHORETICULAR:  No cervical or axillary lymphadenopathy.  NERVOUS SYSTEM: Alert, oriented. No motor deficits. No tremors.  PSYCH:  Appropriate affect.    LABORATORY DATA:    From 04/02/2013 showed: BUN 19, creatinine 1.37, Potassium 4.2, bicarb 22, glucose 186, Phos 3.3, Calcium 9.5, WBC 5,700, Hb 10.1.  Tacrolimus pending. Urinalysis significant for 3+ glucose.        ASSESSMENT:    1.  Deceased-donor (en-bloc kidneys from a donor with acute kidney injury) renal transplant on 12/22/2012.   Serum creatinine is stable to improved. Decrease labs to every other week and return to clinic in three months.    2.  Immunosuppression  He is nonsensitized.He is on dual maintenance immunosuppressive therapy. Tacrolimus level is pending (target 7 - 10 ng/ml).  I will increase his dose of mycophenolate to 750 mg BID.    3.   Antimicrobial prophylaxis  He complete a 2 month course of valganciclovir and trimethoprim/sulfamethoxazole.    4.  Type II diabetes mellitus  Managed by his PCP. He is interested in a pancreas transplant. I will request an appointment with the pancreas surgeon in [facility].    5.  Hypertension  On low-dose of metoprolol. Stable for now.    6.  Anemia.  Stable hemoglobin level. Started on Ferrous Sulfate.      The patient was counseled about the above recommendations and voiced understanding of the discussion.      Return to clinic in 3 months.    Electronically signed by:  Gerri Spore, MD  Attending Physician  Department of Internal Medicine/ Section of Transplant Nephrology  [phone]  Pager # [phone]    04/06/2013  8:32 PM      CC: Genella Rife, MD  [facility]   [address], [address]   [place], [place]  [address]       Genelle Bal, MD   [address]   [place], [place]  [address]

## 2013-04-13 ENCOUNTER — Other Ambulatory Visit: Payer: Self-pay

## 2013-04-13 ENCOUNTER — Other Ambulatory Visit: Payer: Self-pay | Admitting: Surgery

## 2013-04-14 ENCOUNTER — Other Ambulatory Visit: Payer: Self-pay

## 2013-04-14 MED ORDER — FAMOTIDINE 20 MG TABLET
20.0000 mg | ORAL_TABLET | Freq: Every day | ORAL | 6 refills | Status: DC
Start: 2013-04-14 — End: 2013-10-26
  Filled 2013-04-14: qty 30, 30d supply, fill #0
  Filled 2013-05-11: qty 30, 30d supply, fill #1
  Filled 2013-06-09: qty 30, 30d supply, fill #2
  Filled 2013-07-08: qty 30, 30d supply, fill #3
  Filled 2013-08-04: qty 30, 30d supply, fill #4
  Filled 2013-09-03: qty 30, 30d supply, fill #5
  Filled 2013-09-29: qty 30, 30d supply, fill #6

## 2013-04-30 ENCOUNTER — Encounter: Payer: Self-pay | Admitting: Specialist

## 2013-05-11 ENCOUNTER — Other Ambulatory Visit: Payer: Self-pay

## 2013-05-22 ENCOUNTER — Encounter: Payer: Self-pay | Admitting: Specialist

## 2013-06-03 ENCOUNTER — Other Ambulatory Visit: Payer: Self-pay | Admitting: Nephrology

## 2013-06-03 ENCOUNTER — Other Ambulatory Visit: Payer: Self-pay | Admitting: Surgery

## 2013-06-03 ENCOUNTER — Other Ambulatory Visit: Payer: Self-pay

## 2013-06-04 ENCOUNTER — Telehealth: Payer: Self-pay | Admitting: Specialist

## 2013-06-04 MED ORDER — TACROLIMUS 1 MG CAPSULE, IMMEDIATE-RELEASE
6.0000 mg | ORAL_CAPSULE | Freq: Two times a day (BID) | ORAL | 6 refills | Status: DC
Start: 2013-06-04 — End: 2013-07-08
  Filled 2013-06-04 – 2013-06-09 (×2): qty 360, 30d supply, fill #0
  Filled 2013-07-08: qty 360, 30d supply, fill #1

## 2013-06-04 NOTE — Telephone Encounter (Signed)
Detailed message left on patients IDd voicemail.  ---  Notes Recorded by Marline Backbone, MD on 05/28/2013 at 7:52 PM  Reduce to 6 BID  ------    Notes Recorded by Virgina Jock, RN on 05/28/2013 at 5:49 PM  FK level back at 12.0  Dose is 7mg  bid.  > 3months post.  A bit short for a 12hour trough, (meds at 2AM and 2PM) however he is symptomatic with heat sensitivity to hands when washing.    If no reduction he will repeat a proper trough next week.

## 2013-06-05 MED ORDER — INSULIN GLARGINE 100 UNIT/ML SUB-Q VIAL
SUBCUTANEOUS | 0 refills | Status: DC
Start: 2013-06-03 — End: 2013-07-08
  Filled 2013-06-05: fill #0
  Filled 2013-06-09: qty 10, 28d supply, fill #0

## 2013-06-05 MED ORDER — PRAVASTATIN 20 MG TABLET
20.0000 mg | ORAL_TABLET | Freq: Every day | ORAL | 5 refills | Status: DC
Start: 2013-06-03 — End: 2013-11-17
  Filled 2013-06-05: fill #0
  Filled 2013-06-09: qty 30, 30d supply, fill #0
  Filled 2013-07-08: qty 30, 30d supply, fill #1
  Filled 2013-08-04: qty 30, 30d supply, fill #2
  Filled 2013-09-03: qty 30, 30d supply, fill #3
  Filled 2013-09-29: qty 30, 30d supply, fill #4
  Filled 2013-10-28: qty 30, 30d supply, fill #5

## 2013-06-05 MED ORDER — PEN NEEDLE, DIABETIC 31 GAUGE X 3/16"
1.0000 | Freq: Four times a day (QID) | 0 refills | Status: DC | PRN
Start: 2013-06-03 — End: 2013-07-08
  Filled 2013-06-05: fill #0
  Filled 2013-06-09: qty 100, 25d supply, fill #0

## 2013-06-09 ENCOUNTER — Other Ambulatory Visit: Payer: Self-pay

## 2013-06-11 ENCOUNTER — Encounter: Payer: Self-pay | Admitting: Nephrology

## 2013-07-06 ENCOUNTER — Encounter: Payer: Self-pay | Admitting: Specialist

## 2013-07-06 ENCOUNTER — Other Ambulatory Visit: Payer: Self-pay

## 2013-07-08 ENCOUNTER — Telehealth: Payer: Self-pay | Admitting: Specialist

## 2013-07-08 ENCOUNTER — Other Ambulatory Visit: Payer: Self-pay

## 2013-07-08 MED ORDER — TACROLIMUS 1 MG CAPSULE, IMMEDIATE-RELEASE
5.0000 mg | ORAL_CAPSULE | Freq: Two times a day (BID) | ORAL | 6 refills | Status: DC
Start: 2013-07-08 — End: 2013-07-20
  Filled 2013-07-08: fill #0

## 2013-07-08 MED ORDER — LANCETS
5 refills | Status: DC
Start: 2013-07-06 — End: 2013-12-21
  Filled 2013-07-08: qty 100, 25d supply, fill #0
  Filled 2013-08-04: qty 100, 25d supply, fill #1
  Filled 2013-09-03: qty 100, 25d supply, fill #2
  Filled 2013-09-29: qty 100, 25d supply, fill #3
  Filled 2013-10-28: qty 100, 25d supply, fill #4
  Filled 2013-12-01: qty 100, 25d supply, fill #5

## 2013-07-08 MED ORDER — INSULIN GLARGINE 100 UNIT/ML SUB-Q VIAL
SUBCUTANEOUS | 0 refills | Status: DC
Start: 2013-07-06 — End: 2013-08-04
  Filled 2013-07-08: qty 10, 28d supply, fill #0

## 2013-07-08 MED ORDER — PEN NEEDLE, DIABETIC 31 GAUGE X 3/16"
1.0000 | Freq: Four times a day (QID) | 0 refills | Status: DC | PRN
Start: 2013-07-06 — End: 2013-08-04
  Filled 2013-07-08: qty 100, 25d supply, fill #0

## 2013-07-08 MED ORDER — BLOOD SUGAR DIAGNOSTIC STRIPS
ORAL_STRIP | 5 refills | Status: DC
Start: 2013-07-06 — End: 2013-12-21
  Filled 2013-07-08: qty 100, 25d supply, fill #0
  Filled 2013-08-04: qty 100, 25d supply, fill #1
  Filled 2013-09-03: qty 100, 25d supply, fill #2
  Filled 2013-09-29: qty 100, 25d supply, fill #3
  Filled 2013-10-28: qty 100, 25d supply, fill #4
  Filled 2013-12-01: qty 100, 25d supply, fill #5

## 2013-07-08 NOTE — Telephone Encounter (Signed)
Patient able to recite dose adjustment. BK ordered with next lab draw.  -----  Notes Recorded by Orie Fisherman, MD on 07/06/2013 at 1:47 PM  Decrease from 6/6 to 5/5  Add BK - blood and urine to next labs times 1    MG  ------    Notes Recorded by Virgina Jock, RN on 07/06/2013 at 1:43 PM  6 months post transplant   FK level 11.4 with dose of 6mg  bid

## 2013-07-20 ENCOUNTER — Ambulatory Visit: Payer: Medicare Other | Admitting: Nephrology

## 2013-07-20 VITALS — BP 163/105 | HR 109 | Temp 97.2°F | Resp 16

## 2013-07-20 NOTE — Nursing Note (Signed)
>>   Merlyn Albert, NP     Mon Jul 20, 2013  4:07 PM  Pt seen in [place] outreach office by Dr Cipriano Mile.

## 2013-07-20 NOTE — Progress Notes (Signed)
Charles Galvan is a 70yr [name] with a history of stage V chronic kidney disease in the setting of type II diabetes mellitusand hypertension who received a deceased-donor renal transplant (en-bloc kidneys from a 52-month-old, 10.8-kg donor with acute kidney injury - terminal creatinine 2.8 mg/dL) on 16/10/96.Charles Galvan is nonsensitized. 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 His posttransplant course was remarkable forslow graft function, gross hematuria, hyperkalemia, and persistent nausea.    Past medical history includes: type II diabetes mellitus complicated by retinopathy and peripheral neuropathy, and hypertension, hyperlipidemia, depression, vascular access creation, bilateral vitrectomy, bilateral carpal tunnel release and rotator cuff surgeries.     He returns today for routine follow up.    SUBJECTIVE:  Since his last visit, he has been depressed and is asking about antidepressant medication.  He has been drinking energy drinks and not regularly checking blood pressure.  Blood sugar has not been well controlled per his report.    REVIEW OF SYSTEMS:  CONSTITUTIONAL:  No fevers, chills, sweats, malaise, anorexia or fatigue.    EYES:  Negative.    ENT:  Negative.    PULMONARY:  Negative.    CARDIOVASCULAR:  Negative.    GI:  Negative.    GENITOURINARY:  Negative.    MUSCULOSKELETAL:  Negative.    SKIN:  Negative.    NEUROLOGIC:  Negative.      MEDICATIONS:  Aspirin 81 mg DR Tablet EC Tablet, Take 1 tablet by mouth every morning.  Cholecalciferol (VITAMIN D3) 1,000 unit Tablet, Take 1 tablet by mouth every day.  FamoTIDine (PEPCID) 20 mg Tablet, Take 1 tablet by mouth every morning.  ferrous sulfate 325 mg (65 mg iron) Tablet, Take 1 tablet by mouth 2 times daily with meals.  Insulin Aspart (NOVOLOG FLEXPEN) 100 unit/mL Pen,  Inject 3 times a day WITH MEALS < 70, drink juice, 70-130,0 units;131-150,1 unit;151-180,2 units; 181-210,3 units; 211-240,4 units; FSBG 241-270, 5  units; FSBG 271-300,6 units; 301-330, 7 units; 331-360,8 units; 361-400,10 units;> 400,12 units.  Insulin Glargine (LANTUS) 100 unit/mL Vial, Inject 10 units subcutaneously daily  Metoprolol Tartrate (LOPRESSOR) 25 mg Tablet, Take 0.5 tablet (1/2 tablet) by mouth every day at bedtime.  Multivitamin (HEXAVITAMIN) Tablet, Take 1 tablet by mouth every morning.  Mycophenolate (MMF) 250 mg Capsule, Take 3 capsules by mouth 2 times daily.  Pravastatin (PRAVACHOL) 20 mg Tablet, Take 1 tablet by mouth every morning.  Tacrolimus (PROGRAF) 1 mg Capsule, Take 5 capsules by mouth 2 times daily.     No current facility-administered medications for this visit.      FAMILY HISTORY:  Reviewed and noncontributory to this evaluation.    SOCIAL HISTORY:  Reviewed and noncontributory to this evaluation.    PHYSICAL EXAMINATION:  GENERAL:  A young male in no acute distress.  He is pleasant, alert and oriented to person, place and time.  He has no complaint of pain.  VITAL SIGNS:    BP 163/105  Pulse 109  Temp(Src) 36.2 C (97.2 F) (Tympanic)  Resp 16   EYES:  Pupils equal, round, and reactive to light.  Sclerae are anicteric.     ENT:  Oropharynx is clear.  NECK:  No lymphadenopathy.  No jugular venous distention.   LUNGS:  Clear to auscultation.  HEART:  Regular rhythm.  There are S1, S2, no S3, S4, murmur, rub or gallop.   ABDOMEN:  Soft, nontender and nondistended.  No mass, organomegaly, or bruit.  Normal bowel sounds.  The renal allograft is palpable without tenderness in the right lower quadrant.  There is no allograft bruit.    EXTREMITIES:  No peripheral edema.  Intact peripheral pulses.    SKIN:  No rash.  NEUROLOGIC:  Cranial nerves III-XI are intact.  Motor strength is 5/5 throughout.  Sensory intact to light touch.        LABORATORY DATA:  Results for SHYHEEM, WHITHAM (MRN [id number]) as of 07/20/2013 11:35   04/29/2013 11:27 05/21/2013 11:57 06/10/2013 13:59 07/03/2013 10:40   SODIUM 138 142 140 139   POTASSIUM 4.3 4.5 4.2 5.1    CHLORIDE 102 101 101 96   CARBON DIOXIDE TOTAL 24 26 26 29    UREA NITROGEN, BLOOD (BUN) 22 20 16 19    CREATININE BLOOD 1.25 1.31 1.12 1.17   GLUCOSE 126 173 127 276   PHOSPHORUS (PO4) 3.1 2.8 3.0 2.8   CALCIUM 9.3 9.8 9.8 10.1   PROTEIN 6.8 7.1 6.8 7.0   ALBUMIN 4.3 4.9 4.3 4.7   ALKALINE PHOSPHATASE (ALP) 116 136 119 128   ASPARTATE TRANSAMINASE (AST) 13 27 21 12    BILIRUBIN TOTAL 0.1 0.2 0.3 0.3   ALANINE TRANSFERASE (ALT) 23 47 18 13   CREATINE KINASE 65 73 80 72   MAGNESIUM (MG) 1.5 1.7 1.5 1.6   WHITE BLOOD CELL COUNT 6.1 4.7 4.4 4.7   HEMOGLOBIN 12.3 14.1 13.2 14.0   HEMATOCRIT 40.6 43.8 40.9 44.7   PLATELET COUNT 188 203 198 214   NEUTROPHILS % AUTO 79 78 77 77   NEUTROPHIL ABS AUTO 4.8 3.6 3.4 3.6   TACROLIMUS (FK506) 9.8 12.0 9.5 11.4   SPECIFIC GRAVITY 1.026 1.026 1.018 1.034   OCCULT BLOOD URINE 1+ 2+ 2+ 2+   GLUCOSE URINE 2+ 3+ 1+ 3+   PROTEIN URINE neg neg neg 1+   NITRITE URINE neg neg neg neg   LEUK. ESTERASE neg neg neg neg   WBC/HPF 0-5 0-5 0-5 0-5   RBC/HPF 3-10 11-30 11-30 11-30   URINE CULTURE   <10k Enterococcu  25-50k    CREATININE SPOT URINE 113.8 140.3 145.7 134.50   PROTEIN TOTAL SPOT URINE 11.8 21.0 17.7 65.7       IMPRESSION AND PLAN:  1.  Status post pediatric en bloc deceased donor renal transplant.  He has increasing proteinuria and microhematuria suggestive of hyperfiltration injury.  Check 24 hour urinary protein excretion and consider biopsy and ACE inhibitor or ARB.  He denies any urinary tract symptoms, so the significance of enterococcal infection two weeks ago is uncertain.    2.  Immunosuppression.  decrease tacrolimus to 4 mg twice daily.    3.  Hypertension.  Blood pressure is not controlled, and I suspect that he has not been regularly taking medication.  He was reluctant to make any changes, so I suspect that he will resume more regular metoprolol in hopes of controlling the BP.  However, it would be best for him to switch to an ACE inhibitor or ARB.    4.  Hematologic.   Cell counts are stable.    5.  Electrolytes are within acceptable limits.      The patient was counseled about the above findings and recommendations and understood the discussion.  He will return to clinic in six weeks and will have laboratory tests monthly.    Electronically signed by   Rockey Situ, MD PhD  PI: [phone]  Pager: [phone]  Faculty  Division of Nephrology  Department  of Internal Medicine

## 2013-07-21 DIAGNOSIS — Z79899 Other long term (current) drug therapy: Secondary | ICD-10-CM | POA: Insufficient documentation

## 2013-08-04 ENCOUNTER — Other Ambulatory Visit: Payer: Self-pay | Admitting: Specialist

## 2013-08-04 ENCOUNTER — Other Ambulatory Visit: Payer: Self-pay

## 2013-08-04 MED ORDER — TACROLIMUS 1 MG CAPSULE, IMMEDIATE-RELEASE
4.0000 mg | ORAL_CAPSULE | Freq: Two times a day (BID) | ORAL | 6 refills | Status: DC
Start: 2013-08-04 — End: 2014-02-03
  Filled 2013-08-04: qty 240, 30d supply, fill #0
  Filled 2013-09-03: qty 240, 30d supply, fill #1
  Filled 2013-09-29: qty 240, 30d supply, fill #2
  Filled 2013-10-27 – 2013-10-28 (×2): qty 240, 30d supply, fill #3
  Filled 2013-12-01: qty 240, 30d supply, fill #4
  Filled 2013-12-24: qty 240, 30d supply, fill #5
  Filled 2014-01-18: qty 240, 30d supply, fill #6

## 2013-08-04 MED ORDER — INSULIN GLARGINE 100 UNIT/ML SUB-Q VIAL
SUBCUTANEOUS | 0 refills | Status: DC
Start: 2013-08-04 — End: 2013-09-02
  Filled 2013-08-04: qty 10, 28d supply, fill #0

## 2013-08-04 MED ORDER — PEN NEEDLE, DIABETIC 31 GAUGE X 3/16"
1.0000 | Freq: Four times a day (QID) | 3 refills | Status: DC | PRN
Start: 2013-08-04 — End: 2013-11-17
  Filled 2013-08-04: qty 100, 30d supply, fill #0
  Filled 2013-09-03: qty 100, 30d supply, fill #1
  Filled 2013-09-29: qty 100, 30d supply, fill #2
  Filled 2013-10-28: qty 100, 30d supply, fill #3

## 2013-08-04 MED ORDER — FERROUS SULFATE 325 MG (65 MG IRON) TABLET
1.0000 | ORAL_TABLET | Freq: Two times a day (BID) | ORAL | 1 refills | Status: DC
Start: 2013-08-04 — End: 2013-09-24
  Filled 2013-08-04: qty 60, 30d supply, fill #0
  Filled 2013-09-03: qty 60, 30d supply, fill #1

## 2013-08-20 ENCOUNTER — Encounter: Payer: Self-pay | Admitting: Nephrology

## 2013-08-24 ENCOUNTER — Ambulatory Visit: Payer: Medicare Other

## 2013-08-26 ENCOUNTER — Encounter: Payer: Self-pay | Admitting: Specialist

## 2013-08-28 ENCOUNTER — Other Ambulatory Visit: Payer: Self-pay

## 2013-09-02 ENCOUNTER — Other Ambulatory Visit: Payer: Self-pay

## 2013-09-02 MED ORDER — INSULIN GLARGINE 100 UNIT/ML SUB-Q VIAL
SUBCUTANEOUS | 5 refills | Status: DC
Start: 2013-09-02 — End: 2014-02-04
  Filled 2013-09-03: qty 10, 28d supply, fill #0
  Filled 2013-09-29: qty 10, 28d supply, fill #1
  Filled 2013-10-28: qty 10, 28d supply, fill #2
  Filled 2013-12-01: qty 10, 28d supply, fill #3
  Filled 2013-12-24: qty 10, 28d supply, fill #4
  Filled 2014-01-18: qty 10, 28d supply, fill #5

## 2013-09-02 MED ORDER — INSULIN ASPART (U-100) 100 UNIT/ML (3 ML) SUBCUTANEOUS PEN
PEN_INJECTOR | SUBCUTANEOUS | 5 refills | Status: DC
Start: 2013-09-02 — End: 2014-02-04
  Filled 2013-09-03: qty 15, 30d supply, fill #0
  Filled 2013-09-29: qty 15, 30d supply, fill #1
  Filled 2013-10-28: qty 15, 30d supply, fill #2
  Filled 2013-12-01: qty 15, 30d supply, fill #3
  Filled 2013-12-24: qty 15, 30d supply, fill #4
  Filled 2014-01-18: qty 15, 30d supply, fill #5

## 2013-09-03 ENCOUNTER — Other Ambulatory Visit: Payer: Self-pay

## 2013-09-09 ENCOUNTER — Other Ambulatory Visit: Payer: Self-pay

## 2013-09-09 ENCOUNTER — Telehealth: Payer: Self-pay | Admitting: Nephrology

## 2013-09-09 MED ORDER — LISINOPRIL 5 MG TABLET
5.0000 mg | ORAL_TABLET | Freq: Every day | ORAL | 11 refills | Status: DC
Start: 2013-09-09 — End: 2014-01-18
  Filled 2013-09-09: qty 30, 30d supply, fill #0
  Filled 2013-09-29: qty 30, 30d supply, fill #1
  Filled 2013-12-01: qty 30, 30d supply, fill #2
  Filled 2013-12-24: qty 30, 30d supply, fill #3
  Filled 2014-01-18: qty 30, 30d supply, fill #4

## 2013-09-09 NOTE — Telephone Encounter (Signed)
VM left for patient as well as My Chart response.  ----  Notes Recorded by Almeta Monas, MD on 08/29/2013 at 5:12 PM  Please start lisinopril 5 mg daily and recheck labs in two weeks. Thanks  ------    Notes Recorded by Sabino Gasser, MD on 08/28/2013 at 9:14 AM  I am deferring to Dr. Bridget Hartshorn since he follows the patient. The patient did not show for outreach clinic this week.  ------    Notes Recorded by Virgina Jock, RN on 08/27/2013 at 12:35 PM  24hr urine results are in.  Patient informs me he has stopped drinking the ENERGY drinks

## 2013-09-23 ENCOUNTER — Other Ambulatory Visit: Payer: Self-pay

## 2013-09-24 MED ORDER — FERROUS SULFATE 325 MG (65 MG IRON) TABLET
1.0000 | ORAL_TABLET | Freq: Two times a day (BID) | ORAL | 3 refills | Status: DC
Start: 2013-09-24 — End: 2014-01-13
  Filled 2013-09-29: qty 60, 30d supply, fill #0
  Filled 2013-10-28: qty 60, 30d supply, fill #1
  Filled 2013-12-01: qty 60, 30d supply, fill #2
  Filled 2013-12-24: qty 60, 30d supply, fill #3

## 2013-09-29 ENCOUNTER — Other Ambulatory Visit: Payer: Self-pay

## 2013-10-20 ENCOUNTER — Other Ambulatory Visit: Payer: Self-pay

## 2013-10-26 MED ORDER — ASPIRIN 81 MG TABLET,DELAYED RELEASE
81.0000 mg | DELAYED_RELEASE_TABLET | Freq: Every day | ORAL | 3 refills | Status: AC
Start: 2013-10-26 — End: 2014-11-12
  Filled 2013-12-24: qty 100, 100d supply, fill #0
  Filled 2014-03-18: qty 100, 100d supply, fill #1
  Filled 2014-08-02: qty 100, 100d supply, fill #2

## 2013-10-26 MED ORDER — FAMOTIDINE 20 MG TABLET
20.0000 mg | ORAL_TABLET | Freq: Every day | ORAL | 6 refills | Status: DC
Start: 2013-10-26 — End: 2014-04-29
  Filled 2013-10-28: qty 30, 30d supply, fill #0
  Filled 2013-12-01: qty 30, 30d supply, fill #1
  Filled 2013-12-24: qty 30, 30d supply, fill #2
  Filled 2014-01-18: qty 30, 30d supply, fill #3
  Filled 2014-02-08 – 2014-02-15 (×2): qty 30, 30d supply, fill #4
  Filled 2014-03-18: qty 30, 30d supply, fill #5
  Filled 2014-04-12: qty 30, 30d supply, fill #6

## 2013-10-26 MED ORDER — METOPROLOL TARTRATE 25 MG TABLET
12.5000 mg | ORAL_TABLET | Freq: Every day | ORAL | 6 refills | Status: DC
Start: 2013-10-26 — End: 2014-05-06
  Filled 2013-10-27 – 2013-10-28 (×2): qty 15, 30d supply, fill #0
  Filled 2013-12-01: qty 15, 30d supply, fill #1
  Filled 2013-12-24: qty 15, 30d supply, fill #2
  Filled 2014-01-18: qty 15, 30d supply, fill #3
  Filled 2014-02-08 – 2014-02-15 (×2): qty 15, 30d supply, fill #4
  Filled 2014-03-18: qty 15, 30d supply, fill #5
  Filled 2014-04-12: qty 15, 30d supply, fill #6

## 2013-10-27 ENCOUNTER — Other Ambulatory Visit: Payer: Self-pay

## 2013-10-28 ENCOUNTER — Other Ambulatory Visit: Payer: Self-pay

## 2013-11-17 ENCOUNTER — Other Ambulatory Visit: Payer: Self-pay

## 2013-11-17 ENCOUNTER — Other Ambulatory Visit: Payer: Self-pay | Admitting: Nephrology

## 2013-11-18 MED ORDER — PEN NEEDLE, DIABETIC 31 GAUGE X 3/16"
1.0000 | Freq: Four times a day (QID) | 11 refills | Status: DC | PRN
Start: 2013-11-18 — End: 2014-11-10
  Filled 2013-11-18: fill #0
  Filled 2013-12-01: qty 100, 30d supply, fill #0
  Filled 2013-12-24: qty 100, 30d supply, fill #1
  Filled 2014-01-18: qty 100, 30d supply, fill #2
  Filled 2014-02-08 – 2014-02-15 (×2): qty 100, 30d supply, fill #3
  Filled 2014-03-18: qty 100, 30d supply, fill #4
  Filled 2014-04-12: qty 100, 30d supply, fill #5
  Filled 2014-05-10: qty 100, 30d supply, fill #6
  Filled 2014-06-08: qty 100, 30d supply, fill #7
  Filled 2014-07-05: qty 100, 25d supply, fill #8
  Filled 2014-08-02: qty 100, 25d supply, fill #9
  Filled 2014-08-30: qty 100, 25d supply, fill #10
  Filled 2014-09-23 – 2014-09-27 (×2): qty 100, 25d supply, fill #11

## 2013-11-18 MED ORDER — PRAVASTATIN 20 MG TABLET
20.0000 mg | ORAL_TABLET | Freq: Every day | ORAL | 5 refills | Status: DC
Start: 2013-11-18 — End: 2014-04-28
  Filled 2013-11-18: fill #0
  Filled 2013-12-01: qty 30, 30d supply, fill #0
  Filled 2013-12-24: qty 30, 30d supply, fill #1
  Filled 2014-01-18: qty 30, 30d supply, fill #2
  Filled 2014-02-08 – 2014-02-15 (×2): qty 30, 30d supply, fill #3
  Filled 2014-03-18: qty 30, 30d supply, fill #4
  Filled 2014-04-12: qty 30, 30d supply, fill #5

## 2013-11-19 LAB — OUTSIDE LABS
Alanine Transferase (ALT): 43
Albumin: 4.5
Alkaline Phosphatase (ALP): 100
Aspartate Transaminase (AST): 24
Bilirubin Total: 0.3
Calcium: 10.1
Carbon Dioxide Total: 30
Chloride: 97
Creatine Kinase: 78
Creatinine Blood: 1.14
Creatinine Spot Urine: 63.9
Glucose: 304
Hematocrit: 45.3
Hemoglobin: 14.6
Leuk. Esterase: NEGATIVE
Magnesium (Mg): 1.6
Neutrophils % Auto: 78
Neutrophils Abs Auto: 3.7
Nitrite Urine: NEGATIVE
Phosphorus (PO4): 2.8
Platelet Count: 203
Potassium: 4.9
Protein Total Spot Urine: 31.2
Protein: 6.8
Sodium: 138
Specific Gravity: 1.033
Tacrolimus (FK506): 8.1
Urea Nitrogen, Blood (BUN): 16
White Blood Cell Count: 4.7

## 2013-11-19 NOTE — Telephone Encounter (Signed)
From: Fortino Sicscar J Galvan Jr  Sent: 11/17/2013 11:11 AM PST  Subject:  Non-urgent Medical Advice Question    Good  morning, Not sure if earlier message today was received so here we go again.   R  there any concerns or red flags from my last labs done last week? I stopped taking my lesinoprol 1st week of January 2015; altering reaction after each dosage taken; nausea. What do you suggest? Is there another Rx that would sit better with my stomach? I haven't heard from the team for some time; all appears to be going well. As you probably already know my A1c level has risen to 11.1 I'm planning on seeing an Endo Dr. Any suggestions I can do on my end to lower this level dramitically for example exercise etc.? Today I will be seeing my Nephro Dr. Marty HeckJose Galvan and a Dr. in urgent care to get some antibiotics for flu like symptoms.     Happy  New Year !!! Charles RibasOscar Galvan 07/08/1966 [phone]

## 2013-11-19 NOTE — Telephone Encounter (Signed)
Returned pt call;  Instructed pt to make sure he is well hydrated and recheck labs next week due to slight elevation in Creatinine, schedule clinic visit ASAP and   follow up with local nephrologist and endocrinologist.

## 2013-12-01 ENCOUNTER — Encounter: Payer: Self-pay | Admitting: Nephrology

## 2013-12-01 ENCOUNTER — Ambulatory Visit: Payer: Medicare Other | Attending: Nephrology | Admitting: Nephrology

## 2013-12-01 ENCOUNTER — Other Ambulatory Visit: Payer: Self-pay

## 2013-12-01 VITALS — BP 144/88 | HR 84 | Temp 97.0°F | Wt 157.6 lb

## 2013-12-01 DIAGNOSIS — Z48298 Encounter for aftercare following other organ transplant: Principal | ICD-10-CM | POA: Insufficient documentation

## 2013-12-01 DIAGNOSIS — R809 Proteinuria, unspecified: Secondary | ICD-10-CM | POA: Insufficient documentation

## 2013-12-01 DIAGNOSIS — Z940 Kidney transplant status: Secondary | ICD-10-CM | POA: Insufficient documentation

## 2013-12-01 DIAGNOSIS — Z79899 Other long term (current) drug therapy: Secondary | ICD-10-CM | POA: Insufficient documentation

## 2013-12-01 DIAGNOSIS — F32A Depression, unspecified: Secondary | ICD-10-CM

## 2013-12-01 DIAGNOSIS — E119 Type 2 diabetes mellitus without complications: Secondary | ICD-10-CM

## 2013-12-01 DIAGNOSIS — F329 Major depressive disorder, single episode, unspecified: Secondary | ICD-10-CM

## 2013-12-01 NOTE — Progress Notes (Signed)
TRANSPLANT NEPHROLOGY CLINIC NOTE    DATE OF VISIT: 12/01/2013    CC: Here for follow up after kidney transplant.    HPI:  Charles Galvan is a 31yr [name] with a history of stage V chronic kidney disease in the setting of type II diabetes mellitusand hypertension who received a deceased-donor renal transplant (en-bloc kidneys from a 57-month-old, 10.8-kg donor with acute kidney injury - terminal creatinine 2.8 mg/dL) on 16/10/96.[name] is nonsensitized. 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 His posttransplant course was remarkable forslow graft function, gross hematuria, hyperkalemia, and persistent nausea.    Past medical history includes: type II diabetes mellitus complicated by retinopathy and peripheral neuropathy, and hypertension, hyperlipidemia, depression, vascular access creation, bilateral vitrectomy, bilateral carpal tunnel release and rotator cuff surgeries.     He returns today for routine follow up.    SUBJECTIVE/REVIEW OF SYSTEMS:  Since his last visit, he has been depressed and was started on Cymbalta but has not noticed any improvement. He asked me for an appointment with a Psychiatrist.   Blood sugar has not been well controlled per his report (HbA1c 11%).  He had a flu-like illness three weeks ago, finally back to his usual state of health.  He had significant nausea and vomiting when he started Lisinopril and is no longer taking it.  He is exercising by walking four times a week for "a couple of hours".    All the rest of the review of system is negative.     MEDICATIONS:  Current Outpatient Prescriptions on File Prior to Visit   Medication Sig Dispense Refill    Aspirin 81 mg DR Tablet EC Tablet Take 1 tablet by mouth every morning.  100 tablet  3    Blood Sugar Diagnostic (ACCU-CHEK AVIVA PLUS TEST STRP) Strips Use to test blood glucose 4 times daily  100 strip  5    Cholecalciferol (VITAMIN D3) 1,000 unit Tablet Take 1 tablet by mouth every day.   100 tablet  5    FamoTIDine (PEPCID) 20 mg Tablet Tablet Take 1 tablet by mouth every morning.  30 tablet  6    ferrous sulfate 325 mg (65 mg iron) Tablet Take 1 tablet by mouth 2 times daily with meals.        ferrous sulfate 325 mg (65 mg iron) Tablet Take 1 tablet by mouth 2 times daily.  60 tablet  3    Insulin Aspart (NOVOLOG FLEXPEN) 100 unit/mL Pen Inject subcutaneously 3 times a day WITH MEALS: < 70, drink juice, 70-130, 0 units; 131-150, 1 unit; 151-180, 2 units; 181-210,3 units; 211-240,4 units; FSBG 241-270, 5 units; FSBG 271-300,6 units; 301-330, 7 units; 331-360,8 units; 361-400,10 units;> 400,12 units.  15 mL  5    Insulin Glargine (LANTUS) 100 unit/mL Vial Inject 10 units subcutaneously daily  10 mL  5    Insulin Pen Needles, Disposable, (BD ULTRA-FINE III PEN NEEDLE) 31 x 3/16 " Use 1 syringe three to four times daily or as directed. for insulin administration via insulin pen  100 each  11    insulin syr/ndl U100 half mark (BD INSULIN SYRINGE HALF UNIT MARKINGS) 0.3 mL 31 x 5/16" Syringe use 1 syringe to inject lantus daily at bedtime  100 syringe  3    Insulin Syringe-Needle U-100 (INSULIN SYRINGE) 1/2 mL 30 x 5/16" 1 syringe three to four times daily or as directed. for insulin administration  100 syringe  0  Insulin Syringe-Needle U-100 1 mL 30 x 1/2" Use to administer Procrit injection once weekly as directed  4 syringe  1    Lancets (ACCU-CHEK SOFTCLIX LANCETS) Use to test blood glucose 4 times daily  100 each  5    Lisinopril (PRINIVIL, ZESTRIL) 5 mg Tablet Take 1 tablet by mouth every day.  30 tablet  11    Metoprolol Tartrate (LOPRESSOR) 25 mg Tablet Take 0.5 tablet (1/2 tablet) by mouth every day at bedtime.  15 tablet  6    Multivitamin (HEXAVITAMIN) Tablet Take 1 tablet by mouth every morning.  100 tablet  3    Mycophenolate (MMF) 250 mg Capsule Take 3 capsules by mouth 2 times daily. Or as directed. Do NOT take calcium or antacids within 2 hours of any dose. (SANDOZ ONLY;  DO NOT CHANGE MFG FOR REFILLS) +++ ICD 9: V 42.0 +++  180 capsule  11    Pravastatin (PRAVACHOL) 20 mg Tablet Take 1 tablet by mouth every morning.  30 tablet  5    Tacrolimus (PROGRAF) 1 mg Capsule Take 4 capsules by mouth 2 times daily. Or as directed by MD. Take doses every 12 hours. Take morning dose AFTER blood draw on lab days. (SANDOZ ONLY; DO NOT CHANGE MFG FOR REFILLS) +++ ICD 9: V 42.0 +++  240 capsule  6     No current facility-administered medications on file prior to visit.     ALLERGIES:  No Known Allergies    PHYSICAL EXAMINATION:  GENERAL:  A young male in no acute distress. He has no complaint of pain.  VITAL SIGNS:    BP 144/88   Pulse 84   Temp(Src) 36.1 C (97 F) (Tympanic)   Wt 71.5 kg (157 lb 10.1 oz)   BMI 27.93 kg/m2   SpO2 100%   EYES:  Pupils equal, round, and reactive to light.  Sclerae are anicteric.     ENMT:  Oropharynx is clear. No ears or nose lesions seen.  NECK:  No lymphadenopathy.  No jugular venous distention.   RESPIRATORY: Lungs clear to auscultation.  HEART:  Regular rhythm.  There are S1, S2, no S3, S4, murmur, rub or gallop.   GI:  Abdomen is soft, nontender and nondistended.  No mass, organomegaly, or bruit.  Normal bowel sounds.    GU:  The renal allograft is palpable without tenderness in the right lower quadrant.  There is no allograft bruit.    EXTREMITIES:  No peripheral edema.  Intact peripheral pulses.    SKIN:  No rash.  NEUROLOGIC:  He is pleasant, alert and oriented to person, place and time. No tremors.    PSYCH:  Sad affect. He denied suicidal ideations.      LABORATORY DATA:  Lab Results   Lab Name Value Date/Time    NA 138 11/11/2013 12:44 PM    K 4.9 11/11/2013 12:44 PM    CL 97 11/11/2013 12:44 PM    CO2 30 11/11/2013 12:44 PM    BUN 16 11/11/2013 12:44 PM    CR 1.14 11/11/2013 12:44 PM    GLU 304 11/11/2013 12:44 PM     Lab Results   Lab Name Value Date/Time    WBC 4.7 11/11/2013 12:44 PM    HGB 14.6 11/11/2013 12:44 PM    HCT 45.3 11/11/2013 12:44 PM    PLT 203  11/11/2013 12:44 PM     Lab Results   Lab Name Value Date/Time    TACROLIMUS 8.1 11/11/2013  12:44 PM        IMPRESSION AND PLAN:  1.   Deceased-donor renal transplant (en-bloc kidneys from a 2346-month-old, 10.8-kg donor with acute kidney injury - terminal creatinine 2.8 mg/dL) on 81/19/1402/01/02.    He has increasing proteinuria and microhematuria suggestive of hyperfiltration injury. We will schedule a renal biopsy.  He could not tolerate Lisinopril. We may try another ACE inhibitor or an ARB.      2.   Immunosuppression.    Not sensitized. On Tacrolimus and mycophenolate on appropriate dosages. Will check DSA and BK PCR at the time of biopsy.    3.   Hypertension.    Blood pressure is not controlled.  He was reluctant to make any changes.  It would be ideal for him to start an ACE inhibitor or ARB.    4.   Hematologic.    Cell counts are stable.    5.  Electrolytes.  Most are within acceptable limits.    6. Depression.  We will send a referral for Psychiatry.      The patient was counseled about the above findings and recommendations and voiced understanding of the discussion.  He will return to clinic in six weeks and will have laboratory tests monthly.    Electronically signed by:  Gerri SporeAngelo De Mattos, MD  Attending Physician  Department of Internal Medicine/ Section of Transplant Nephrology  [phone]#[id number]  Pager # [phone]    12/01/2013  5:22 PM

## 2013-12-01 NOTE — Nursing Note (Signed)
>>   Nash DimmerLACY WOOD, MA     Tue Dec 01, 2013  4:12 PM  Vital signs obtained. Drug allergies verified. Screened for pain. Pharmacy information updated.      Nash DimmerLacy Wood, MA

## 2013-12-04 ENCOUNTER — Telehealth: Payer: Self-pay | Admitting: Nephrology

## 2013-12-04 NOTE — Telephone Encounter (Signed)
===  View-only below this line===    ----- Message -----     From: Lorretta HarpMaria Leyba     Sent: 12/04/2013  11:51 AM       To: Claire Shownracy Anzel Kearse, RN  Subject: RE: Berkley Harveyauth for psych referral                      I left pt voicemail message to call me back.       ----- Message -----     From: Claire Shownracy Evanie Buckle, RN     Sent: 12/04/2013  11:18 AM       To: [facility]  Subject: FW: Berkley Harveyauth for psych referral                      Just clarifying so I don't goof - do you call pt with behavioral health ph # or do I?  ----- Message -----     From: Letitia Librahristina J Sheppard     Sent: 12/04/2013   9:10 AM       To: Virgina JockMichael Nowak, RN, [facility], *  Subject: RE: Berkley Harveyauth for psych referral                      No auth required. Patient ok to schedule.    Thanks  ----- Message -----     From: Virgina JockMichael Nowak, RN     Sent: 12/01/2013   5:39 PM       To: [facility]  Subject: auth for psych referral

## 2013-12-04 NOTE — Telephone Encounter (Signed)
Message copied by Virgina JockNOWAK, Lennyn Gange on Fri Dec 04, 2013  2:16 PM  ------       Message from: Lorretta HarpLEYBA, MARIA       Created: Fri Dec 04, 2013 11:53 AM       Regarding: RE: Berkley Harveyauth for psych referral         Left voicemail message for pt to contact me back. Due to it being psych referral will wait for return call to advise ok to call and the # for psych.                      ----- Message -----          From: Letitia Librahristina J Sheppard          Sent: 12/04/2013   9:10 AM            To: Virgina JockMichael Viktorya Arguijo, RN, [facility], #       Subject: RE: Berkley Harveyauth for psych referral                                No auth required. Patient ok to schedule.              Thanks       ----- Message -----          From: Virgina JockMichael Jovanny Stephanie, RN          Sent: 12/01/2013   5:39 PM            To: [facility]       Subject: Berkley Harveyauth for psych referral                                    Patient to see transplant psych for depression.                ------

## 2013-12-15 ENCOUNTER — Other Ambulatory Visit: Payer: Self-pay

## 2013-12-21 MED ORDER — BLOOD SUGAR DIAGNOSTIC STRIPS
ORAL_STRIP | 5 refills | Status: DC
Start: 2013-12-21 — End: 2014-06-08
  Filled 2013-12-24: qty 100, 25d supply, fill #0
  Filled 2014-01-18: qty 100, 25d supply, fill #1
  Filled 2014-02-08 – 2014-02-15 (×2): qty 100, 25d supply, fill #2
  Filled 2014-03-18: qty 100, 25d supply, fill #3
  Filled 2014-04-12: qty 100, 25d supply, fill #4
  Filled 2014-05-10: qty 100, 25d supply, fill #5

## 2013-12-21 MED ORDER — LANCETS
5 refills | Status: DC
Start: 2013-12-21 — End: 2014-06-08
  Filled 2013-12-24: qty 100, 25d supply, fill #0
  Filled 2014-01-18: qty 100, 25d supply, fill #1
  Filled 2014-02-08 – 2014-02-15 (×2): qty 100, 25d supply, fill #2
  Filled 2014-03-18: qty 100, 25d supply, fill #3
  Filled 2014-04-12: qty 100, 25d supply, fill #4
  Filled 2014-05-10: qty 100, 25d supply, fill #5

## 2013-12-21 MED ORDER — INSULIN SYRINGE-NEEDLE U-100 HALF UNIT MARKING 0.3 ML 31 GAUGE X 5/16"
INJECTION | 3 refills | Status: DC
Start: 2013-12-21 — End: 2014-03-23
  Filled 2013-12-24: qty 100, 30d supply, fill #0
  Filled 2014-01-18: qty 100, 30d supply, fill #1
  Filled 2014-02-08 – 2014-02-15 (×2): qty 100, 30d supply, fill #2
  Filled 2014-03-18: qty 100, 30d supply, fill #3

## 2013-12-24 ENCOUNTER — Other Ambulatory Visit: Payer: Self-pay

## 2013-12-25 ENCOUNTER — Telehealth: Payer: Self-pay | Admitting: Specialist

## 2013-12-25 ENCOUNTER — Encounter: Payer: Self-pay | Admitting: Specialist

## 2013-12-25 NOTE — Telephone Encounter (Signed)
Your renal transplant surveillance biopsy is scheduled for 12/31/13.     You are to [facility] at rm 17[facility]Radiology [facility] that [facility].    You are to provide a responsible driver to take you home after the procedure. You may eat a light breakfast and take your routine medications that morning.    Have special labs (APTT/INR- slips provided) drawn 1-2 weeks prior to the procedure.     Stop aspirin 7-10 days prior to your procedure.    Expect to stay in recovery for 4hours following the procedure for observation.  If for some reason you have to cancel your biopsy, please contact your transplant coordinator at [phone] or [phone]

## 2013-12-31 ENCOUNTER — Ambulatory Visit
Admission: RE | Admit: 2013-12-31 | Discharge: 2013-12-31 | Disposition: A | Discharge status: Home / Self Care | Payer: Medicare Other | Source: Ambulatory Visit | Attending: Nephrology | Admitting: Nephrology

## 2013-12-31 VITALS — BP 113/69 | HR 72 | Temp 98.4°F | Resp 18 | Ht 63.0 in | Wt 155.0 lb

## 2013-12-31 DIAGNOSIS — N049 Nephrotic syndrome with unspecified morphologic changes: Secondary | ICD-10-CM

## 2013-12-31 DIAGNOSIS — Z48298 Encounter for aftercare following other organ transplant: Secondary | ICD-10-CM | POA: Insufficient documentation

## 2013-12-31 DIAGNOSIS — R319 Hematuria, unspecified: Secondary | ICD-10-CM

## 2013-12-31 DIAGNOSIS — Z940 Kidney transplant status: Secondary | ICD-10-CM

## 2013-12-31 DIAGNOSIS — E1129 Type 2 diabetes mellitus with other diabetic kidney complication: Secondary | ICD-10-CM

## 2013-12-31 LAB — OUTSIDE LABS
ALANINE TRANSFERASE (ALT): 21
ALBUMIN: 4.4
ALKALINE PHOSPHATASE (ALP): 105
APTT: 23
ASPARTATE TRANSAMINASE (AST): 16
BILIRUBIN TOTAL: 0.3
CALCIUM: 9.2
CARBON DIOXIDE TOTAL: 26
CHLORIDE: 98
CREATINE KINASE: 91
CREATININE BLOOD: 0.78
CREATININE SPOT URINE: 61.2
GLUCOSE: 241
HEMATOCRIT: 42.7
HEMOGLOBIN: 14
INR: 0.9
LEUK. ESTERASE: NEGATIVE
MAGNESIUM (MG): 1.6
NEUTROPHIL ABS AUTO: 3
NEUTROPHILS % AUTO: 76
NITRITE URINE: NEGATIVE
PHOSPHORUS (PO4): 2.8
PLATELET COUNT: 187
POTASSIUM: 4.3
PROTEIN TOTAL SPOT URINE: 31.1
PROTEIN: 6.5
RBC/HPF: NONE SEEN
SODIUM: 139
SPECIFIC GRAVITY: 1.04
TACROLIMUS (FK506): 6.7
UREA NITROGEN, BLOOD (BUN): 10
WHITE BLOOD CELL COUNT: 4

## 2013-12-31 LAB — CREATININE SPOT URINE: CREATININE SPOT URINE: 81.74 mg/dL

## 2013-12-31 LAB — PROTEIN TOTAL SPOT URINE: PROTEIN TOTAL SPOT URINE: 11 mg/dL

## 2013-12-31 NOTE — Procedures (Signed)
PROCEDURE RECORD    PREOPERATIVE DIAGNOSIS:     hematuria.     POSTOPERATIVE DIAGNOSIS: Same.     PROCEDURE:     Kidney transplant biopsy.     COMPLICATIONS: None apparent.     DESCRIPTION:     After obtaining informed consent patient was brought to the ultrasound suite. After completing the pre-procedure pause and under direct ultrasound visualization 3 core fragments were obtained with local anesthesia. 3 passes were done. The postbiopsy ultrasound showed no hematoma. Patient was wheeled to the short stay unit for two-hour observation.     DISPOSITION:     Patient will be discharged two hours after biopsy, and will follow up with the biopsy results in clinic in the next two to three weeks.       Electronically signed by:  Gerri SporeAngelo De Mattos, MD  Attending Physician  [phone]#[id number]  Department of Internal Medicine  Transplant/Nephrology  Pager # [phone]    12/31/2013  10:32

## 2013-12-31 NOTE — H&P (Signed)
BRIEF PRE PROCEDURE H&P    DATE OF PROCEDURE:12/31/2013    ZO:XWRU for a kidney transplant biopsy.    HPI:  Charles Galvan is a 47yr [name] with a history of stage V chronic kidney disease in the setting of type II diabetes mellitusand hypertension who received a deceased-donor renal transplant (en-bloc kidneys from a 75-month-old, 10.8-kg donor with acute kidney injury - terminal creatinine 2.8 mg/dL) on 04/54/09.Charles Galvan is nonsensitized. 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 His posttransplant course was remarkable forslow graft function, gross hematuria, hyperkalemia, and persistent nausea.    Past medical history includes: type II diabetes mellitus complicated by retinopathy and peripheral neuropathy, and hypertension, hyperlipidemia, depression, vascular access creation, bilateral vitrectomy, bilateral carpal tunnel release and rotator cuff surgeries.     He returns today for a kidney transplant biopsy.    SUBJECTIVE/REVIEW OF SYSTEMS:  Since his last visit, he is doing "okay".  He stopped taking the anti-depression tabs and denied overwhelming depression.   No fevers, chills, cough, chest pains or palpitations.  No dysuria or hematuria.    All the rest of the review of system is negative.     MEDICATIONS:  No current facility-administered medications on file prior to encounter.     Current Outpatient Prescriptions on File Prior to Encounter   Medication Sig Dispense Refill    Aspirin 81 mg DR Tablet EC Tablet Take 1 tablet by mouth every morning.  100 tablet  3    Blood Sugar Diagnostic (ACCU-CHEK AVIVA PLUS TEST STRP) Strips Use to test blood glucose 4 times daily  100 strip  5    FamoTIDine (PEPCID) 20 mg Tablet Tablet Take 1 tablet by mouth every morning.  30 tablet  6    ferrous sulfate 325 mg (65 mg iron) Tablet Take 1 tablet by mouth 2 times daily.  60 tablet  3    Insulin Aspart (NOVOLOG FLEXPEN) 100 unit/mL Pen Inject subcutaneously 3  times a day WITH MEALS: < 70, drink juice, 70-130, 0 units; 131-150, 1 unit; 151-180, 2 units; 181-210,3 units; 211-240,4 units; FSBG 241-270, 5 units; FSBG 271-300,6 units; 301-330, 7 units; 331-360,8 units; 361-400,10 units;> 400,12 units.  15 mL  5    Insulin Glargine (LANTUS) 100 unit/mL Vial Inject 10 units subcutaneously daily  10 mL  5    Insulin Pen Needles, Disposable, (BD ULTRA-FINE III PEN NEEDLE) 31 x 3/16 " Use 1 syringe three to four times daily or as directed. for insulin administration via insulin pen  100 each  11    insulin syr/ndl U100 half mark (BD INSULIN SYRINGE HALF UNIT MARKINGS) 0.3 mL 31 x 5/16" Syringe use 1 syringe to inject lantus daily at bedtime  100 syringe  3    Insulin Syringe-Needle U-100 (INSULIN SYRINGE) 1/2 mL 30 x 5/16" 1 syringe three to four times daily or as directed. for insulin administration  100 syringe  0    Lancets (ACCU-CHEK SOFTCLIX LANCETS) Use to test blood glucose 4 times daily  100 each  5    Lisinopril (PRINIVIL, ZESTRIL) 5 mg Tablet Take 1 tablet by mouth every day.  30 tablet  11    Metoprolol Tartrate (LOPRESSOR) 25 mg Tablet Take 0.5 tablet (1/2 tablet) by mouth every day at bedtime.  15 tablet  6    Mycophenolate (MMF) 250 mg Capsule Take 3 capsules by mouth 2 times daily. Or as directed. Do NOT take calcium or antacids within 2 hours  of any dose. (SANDOZ ONLY; DO NOT CHANGE MFG FOR REFILLS) +++ ICD 9: V 42.0 +++  180 capsule  11    Pravastatin (PRAVACHOL) 20 mg Tablet Take 1 tablet by mouth every morning.  30 tablet  5    Tacrolimus (PROGRAF) 1 mg Capsule Take 4 capsules by mouth 2 times daily. Or as directed by MD. Take doses every 12 hours. Take morning dose AFTER blood draw on lab days. (SANDOZ ONLY; DO NOT CHANGE MFG FOR REFILLS) +++ ICD 9: V 42.0 +++  240 capsule  6       ALLERGIES:  No Known Allergies    PHYSICAL EXAMINATION:  GENERAL: A young male in no acute distress. He has no complaint of pain.  Filed Vitals:    12/31/13 1004    BP: 132/89   Pulse: 76   Temp: 36.9 C (98.4 F)   TempSrc: Temporal   Resp: 18   Height: 1.6 m (5\' 3" )   Weight: 70.308 kg (155 lb)   SpO2: 100%       EYES: Pupils equal, round, and reactive to light. Sclerae are anicteric.   ENMT: Oropharynx is clear. No ears or nose lesions seen.  NECK: No lymphadenopathy. No jugular venous distention.   RESPIRATORY: Lungs clear to auscultation.  HEART: Regular rhythm. There are S1, S2, no S3, S4, murmur, rub or gallop.   GI: Abdomen is soft, nontender and nondistended. No mass, organomegaly, or bruit. Normal bowel sounds.   GU: The renal allograft is palpable without tenderness in the right lower quadrant. There is no allograft bruit.   EXTREMITIES: No peripheral edema. Intact peripheral pulses.   SKIN: No rash.  NEUROLOGIC: He is pleasant, alert and oriented to person, place and time. No tremors.   PSYCH:appropriate affect. He denied suicidal ideations.      LABORATORY DATA:  Lab Results   Lab Name Value Date/Time    NA 139 12/24/2013 11:29 AM    K 4.3 12/24/2013 11:29 AM    CL 98 12/24/2013 11:29 AM    CO2 26 12/24/2013 11:29 AM    BUN 10 12/24/2013 11:29 AM    CR 0.78 12/24/2013 11:29 AM    GLU 241 12/24/2013 11:29 AM     Lab Results   Lab Name Value Date/Time    WBC 4.0 12/24/2013 11:29 AM    HGB 14.0 12/24/2013 11:29 AM    HCT 42.7 12/24/2013 11:29 AM    PLT 187 12/24/2013 11:29 AM     Lab Results   Lab Name Value Date/Time    TACROLIMUS 6.7 12/24/2013 11:29 AM        ASSESSMENT:  Risks and benefits of biopsy discussed with patient at length. Patient voiced understanding and willingness to proceed with biopsy. Informed consent form signed.    Electronically signed by:  Gerri SporeAngelo De Mattos, MD  Attending Physician  Department of Internal Medicine/ Section of Transplant Nephrology  [phone]#[id number]  Pager # [phone]    12/31/2013  10:15

## 2014-01-01 LAB — BKV VIRAL LOAD, URINE

## 2014-01-01 LAB — BKV VIRAL LOAD, BLOOD

## 2014-01-04 ENCOUNTER — Encounter: Payer: Self-pay | Admitting: Nephrology

## 2014-01-04 LAB — IMMUKNOW

## 2014-01-07 ENCOUNTER — Other Ambulatory Visit: Payer: Self-pay | Admitting: Nephrology

## 2014-01-08 LAB — IMMUKNOW: IMMUKNOW: 98

## 2014-01-11 ENCOUNTER — Other Ambulatory Visit: Payer: Self-pay

## 2014-01-13 ENCOUNTER — Encounter: Payer: Self-pay | Admitting: Specialist

## 2014-01-13 ENCOUNTER — Ambulatory Visit: Payer: Medicare Other | Attending: Nephrology | Admitting: Specialist

## 2014-01-13 VITALS — BP 162/101 | HR 98 | Temp 96.8°F | Wt 161.6 lb

## 2014-01-13 DIAGNOSIS — E1129 Type 2 diabetes mellitus with other diabetic kidney complication: Secondary | ICD-10-CM

## 2014-01-13 DIAGNOSIS — I10 Essential (primary) hypertension: Secondary | ICD-10-CM

## 2014-01-13 DIAGNOSIS — Z79899 Other long term (current) drug therapy: Secondary | ICD-10-CM | POA: Insufficient documentation

## 2014-01-13 DIAGNOSIS — Z5181 Encounter for therapeutic drug level monitoring: Secondary | ICD-10-CM

## 2014-01-13 DIAGNOSIS — Z940 Kidney transplant status: Secondary | ICD-10-CM | POA: Insufficient documentation

## 2014-01-13 DIAGNOSIS — R809 Proteinuria, unspecified: Secondary | ICD-10-CM | POA: Insufficient documentation

## 2014-01-13 DIAGNOSIS — Z48298 Encounter for aftercare following other organ transplant: Principal | ICD-10-CM | POA: Insufficient documentation

## 2014-01-13 MED ORDER — VALSARTAN 40 MG TABLET
40.0000 mg | ORAL_TABLET | Freq: Every day | ORAL | 11 refills | Status: DC
Start: 2014-01-13 — End: 2014-12-08
  Filled 2014-01-13: fill #0
  Filled 2014-01-18: qty 30, 30d supply, fill #0
  Filled 2014-02-08 – 2014-02-15 (×2): qty 30, 30d supply, fill #1
  Filled 2014-03-18: qty 30, 30d supply, fill #2
  Filled 2014-04-12: qty 30, 30d supply, fill #3
  Filled 2014-05-10: qty 30, 30d supply, fill #4
  Filled 2014-06-08: qty 30, 30d supply, fill #5
  Filled 2014-07-05: qty 30, 30d supply, fill #6
  Filled 2014-08-02: qty 30, 30d supply, fill #7
  Filled 2014-08-30: qty 30, 30d supply, fill #8
  Filled 2014-09-23 – 2014-09-27 (×2): qty 30, 30d supply, fill #9
  Filled 2014-11-03: qty 30, 30d supply, fill #10
  Filled 2014-12-08: qty 30, 30d supply, fill #11

## 2014-01-13 MED ORDER — FERROUS SULFATE 325 MG (65 MG IRON) TABLET
1.0000 | ORAL_TABLET | Freq: Two times a day (BID) | ORAL | 3 refills | Status: DC
Start: 2014-01-13 — End: 2014-05-06
  Filled 2014-01-18: qty 60, 30d supply, fill #0
  Filled 2014-02-08 – 2014-02-15 (×2): qty 60, 30d supply, fill #1
  Filled 2014-03-18: qty 60, 30d supply, fill #2
  Filled 2014-04-12: qty 60, 30d supply, fill #3

## 2014-01-13 NOTE — Progress Notes (Signed)
OUTPATIENT FOLLOW-UP  01/13/2014    Patient Active Problem List   Diagnosis    Dyslipidemia    GERD (gastroesophageal reflux disease)    Depression    Secondary hyperparathyroidism of renal origin    HTN (hypertension)    Diabetes mellitus, type II    Aftercare following organ transplant    Long-term use of immunosuppressant medication    Fever    Acidosis, metabolic    Deceased-donor kidney transplant recipient    Gastroparesis    Immunosuppressive management encounter following kidney transplant       Past Medical History   Diagnosis Date    Kidney disease     Diabetes mellitus     Hypertension        HISTORY:  53yr [name] with a history of stage V chronic kidney disease in the setting of type II diabetes mellitusand hypertension who received a deceased-donor renal transplant (en-bloc kidneys from a 69-month-old, 10.8-kg donor with acute kidney injury - terminal creatinine 2.8 mg/dL) on 16/10/96.[name] is nonsensitized. 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 His posttransplant course was remarkable forslow graft function, gross hematuria, hyperkalemia, and persistent nausea. Here today for post kidney biopsy follow up       Rejection Episodes:NO  BK Screening: < 500    12/2013  Pertinent Infections: No    SUBJECTIVE:  Since his last visit he says that his BP is v well controlled at home so refused to take lisinopril, underwent kidney bx on 12/31/2013 and verbally that did not show any specific pathology( awaiting the final report) , no fever v good UOP     REVIEW OF SYSTEMS:  Constitutional: Generally feels well  Eyes: The patient denies changes in visual acuity, diplopia or amaurosis, no discharge, matting, redness, tearing or eye pain.  Ears, Nose, Mouth, Throat: negative for hearing problems, no complains of chronic sinus problems.  No mouth or dental compliants.   CV: No complaints of palpitations, syncope, chest pain, paroxysmal nocturnal dyspnea, orthopnea,  cyanosis, or claudication.  Resp: No complaints of cough, hemoptysis, shortness of breath, pleuritic pain, wheezing, dyspnea on exertion  GI: No complaints of vomiting, dysphagia, nausea, heartburn or reflux, early satiety, abdominal pain, bleeding from rectum, constipation, diarrhea.  GU:  making urine. No complaints of dysuria, or hematuria.  Musculoskeletal: No complaints of joint or muscle pain, swelling, or tenderness .  Integumentary: Negative for moles that have changed, rash, itching, bruising, lumps or bumps.  Neuro: No complaints of headaches, seizures, paralysis/weakness,no tremors   Psych: No complaints of depressed mood, insomnia, or anxiety.  Endo:No complaints of cold intolerance, heat intolerance.  Heme/Lymphatic:No complaints of bleeding disorder, abnormal bleeding, abnormal bruising.  Allergy/Immun: No complaints of hay fever, itchy eyes.    MEDICATIONS:  Prograf 4 mg BID  Cellcept 750 mg BID       FAMILY HISTORY:  No family history on file.    SOCIAL HISTORY:  History     Social History    Marital Status: MARRIED     Spouse Name: N/A     Number of Children: N/A    Years of Education: N/A     Occupational History    Not on file.     Social History Main Topics    Smoking status: Never Smoker     Smokeless tobacco: Never Used    Alcohol Use: No    Drug Use: No    Sexual Activity: Not on file  Other Topics Concern    Not on file     Social History Narrative    No narrative on file       Reviewed and noncontributory.    PHYSICAL EXAMINATION:  BP 162/101   Pulse 98   Temp(Src) 36 C (96.8 F) (Tympanic)   Wt 73.3 kg (161 lb 9.6 oz)   SpO2 97%  Wt Readings from Last 4 Encounters:   01/13/14 73.3 kg (161 lb 9.6 oz)   12/31/13 70.308 kg (155 lb)   12/01/13 71.5 kg (157 lb 10.1 oz)   04/06/13 67.132 kg (148 lb)       Physical Exam:General Appearance: not pale or jaundiced   Eyes: WNL  ENT: WNL  Neck: :supple. No adenopathy, thyroid symmetric, normal size   Cardiovascular: normal rate and  regular rhythm, no murmurs, clicks, or gallops.   Respiratory: clear to auscultation, no crackles or wheezes   Abdomen: soft, non-tender. No masses or organomegaly.   GU: The renal allograft With no tenderness no bruits   Extremities: No edema  Musculoskeletal: No arthritis   Skin: No rashes or lesions.  Neuro:No tremors     LABORATORY DATA:  SODIUM   Date Value Range Status   12/24/2013 139   Final   11/11/2013 138   Corrected   08/19/2013 142   Final     POTASSIUM   Date Value Range Status   12/24/2013 4.3   Final   11/11/2013 4.9   Corrected   08/19/2013 4.2   Final     CHLORIDE   Date Value Range Status   12/24/2013 98   Final   11/11/2013 97   Corrected   08/19/2013 101   Final     CARBON DIOXIDE TOTAL   Date Value Range Status   12/24/2013 26   Corrected   11/11/2013 30   Corrected   08/19/2013 28   Final     UREA NITROGEN, BLOOD (BUN)   Date Value Range Status   12/24/2013 10   Corrected   11/11/2013 16   Corrected   08/19/2013 13   Final     CREATININE BLOOD   Date Value Range Status   12/24/2013 0.78   Corrected   11/11/2013 1.14   Corrected   08/19/2013 0.86   Final     CALCIUM   Date Value Range Status   12/24/2013 9.2   Corrected   11/11/2013 10.1   Corrected   08/19/2013 9.6   Final     GLUCOSE   Date Value Range Status   12/24/2013 241   Corrected   11/11/2013 304   Corrected   08/19/2013 125   Final   07/03/2013 276   Final   06/10/2013 127   Final   05/21/2013 173   Final     No components found with this basename: phosphorus     No components found with this basename: magnesium     ASPARTATE TRANSAMINASE (AST)   Date Value Range Status   12/24/2013 16   Corrected   11/11/2013 24   Corrected   08/19/2013 24   Final       No results found for this basename: Chol, Trig, HDL     TACROLIMUS (FK506)   Date Value Range Status   12/24/2013 6.7   Corrected   11/11/2013 8.1   Corrected   08/19/2013 6.6   Corrected   07/03/2013 11.4   Final       WHITE  BLOOD CELL COUNT   Date Value Range Status   12/24/2013 4.0   Final   11/11/2013  4.7   Final   08/19/2013 4.5   Corrected        HEMOGLOBIN   Date Value Range Status   12/24/2013 14.0   Final   11/11/2013 14.6   Final   08/19/2013 41.2   Final        HEMATOCRIT   Date Value Range Status   12/24/2013 42.7   Final   11/11/2013 45.3   Final   08/19/2013 213   Final        PLATELET COUNT   Date Value Range Status   12/24/2013 187   Final   11/11/2013 203   Final   07/03/2013 214   Final         IMPRESSION AND PLAN:    1.  S/P Renal Transplant:  Renal function is stable, no protein currently in the urine awaiting official report     2.  Immunosuppression.  Current immunsuppression is prograf and cellcept     3.  Hypertension.  Elevated will start diovan 40 mg daily     4.  Hematologic.  Cell counts are stable.    5.  Electrolytes are within acceptable limits.    6.  DM: will meet the Endocrinologist tomorrow for better control of DM     The patient was counseled about the above findings and recommendations and understood the discussion. Fortino Sicscar J Cantu Jr will return to clinic in 6 months  and will have laboratory tests monthly     [facility] TheMuna Dareon Nunziato  Transplant Nephrology  [facility]  PI [phone]  Pager [phone]

## 2014-01-13 NOTE — Nursing Note (Signed)
>>   LACY WOOD, MA     Wed Jan 13, 2014  2:06 PM  Vital signs obtained. Patient roomed using 2 patient identifiers. Drug allergies verified. Screened for pain, 0 of 10 on pain scale. Pharmacy information updated.     Nash DimmerLacy Wood, MA

## 2014-01-18 ENCOUNTER — Other Ambulatory Visit: Payer: Self-pay

## 2014-01-18 LAB — DSA TITER

## 2014-01-29 LAB — SURGICAL PATHOLOGY

## 2014-02-03 ENCOUNTER — Other Ambulatory Visit: Payer: Self-pay

## 2014-02-03 ENCOUNTER — Other Ambulatory Visit: Payer: Self-pay | Admitting: Specialist

## 2014-02-03 DIAGNOSIS — Z940 Kidney transplant status: Principal | ICD-10-CM

## 2014-02-04 MED ORDER — INSULIN GLARGINE 100 UNIT/ML SUB-Q VIAL
SUBCUTANEOUS | 5 refills | Status: DC
Start: 2014-02-04 — End: 2014-09-27
  Filled 2014-02-08 – 2014-02-15 (×2): qty 10, 30d supply, fill #0
  Filled 2014-03-18: qty 10, 30d supply, fill #1
  Filled 2014-04-12: qty 10, 28d supply, fill #2
  Filled 2014-05-10: qty 10, 28d supply, fill #3
  Filled 2014-06-08: qty 10, 28d supply, fill #4
  Filled 2014-07-05: qty 10, 28d supply, fill #5

## 2014-02-04 MED ORDER — TACROLIMUS 1 MG CAPSULE, IMMEDIATE-RELEASE
4.0000 mg | ORAL_CAPSULE | Freq: Two times a day (BID) | ORAL | 11 refills | Status: DC
Start: 2014-02-04 — End: 2015-02-08
  Filled 2014-02-04: fill #0
  Filled 2014-02-08 – 2014-02-15 (×2): qty 240, 30d supply, fill #0
  Filled 2014-03-18: qty 240, 30d supply, fill #1
  Filled 2014-04-12: qty 240, 30d supply, fill #2
  Filled 2014-05-10: qty 240, 30d supply, fill #3
  Filled 2014-06-08: qty 240, 30d supply, fill #4
  Filled 2014-07-05: qty 240, 30d supply, fill #5
  Filled 2014-08-02 – 2014-08-04 (×2): qty 240, 30d supply, fill #6
  Filled 2014-08-30: qty 240, 30d supply, fill #7
  Filled 2014-09-23 – 2014-09-27 (×2): qty 240, 30d supply, fill #8
  Filled 2014-11-03: qty 240, 30d supply, fill #9
  Filled 2014-12-08: qty 240, 30d supply, fill #10
  Filled 2015-02-02: qty 240, 30d supply, fill #11

## 2014-02-04 MED ORDER — INSULIN ASPART (U-100) 100 UNIT/ML (3 ML) SUBCUTANEOUS PEN
PEN_INJECTOR | SUBCUTANEOUS | 5 refills | Status: DC
Start: 2014-02-04 — End: 2014-09-27
  Filled 2014-02-08 – 2014-02-15 (×2): qty 15, 30d supply, fill #0
  Filled 2014-03-18: qty 15, 30d supply, fill #1
  Filled 2014-04-12: qty 15, 30d supply, fill #2
  Filled 2014-05-10: qty 15, 30d supply, fill #3
  Filled 2014-06-08: qty 15, 30d supply, fill #4
  Filled 2014-07-05: qty 15, 30d supply, fill #5

## 2014-02-08 ENCOUNTER — Other Ambulatory Visit: Payer: Self-pay

## 2014-02-15 ENCOUNTER — Other Ambulatory Visit: Payer: Self-pay

## 2014-03-09 ENCOUNTER — Other Ambulatory Visit: Payer: Self-pay | Admitting: Nephrology

## 2014-03-09 ENCOUNTER — Other Ambulatory Visit: Payer: Self-pay

## 2014-03-09 DIAGNOSIS — Z940 Kidney transplant status: Principal | ICD-10-CM

## 2014-03-09 MED ORDER — MYCOPHENOLATE MOFETIL 250 MG CAPSULE
750.0000 mg | ORAL_CAPSULE | Freq: Two times a day (BID) | ORAL | 11 refills | Status: DC
Start: 2014-03-09 — End: 2015-03-16
  Filled 2014-03-09: fill #0
  Filled 2014-03-18: qty 180, 30d supply, fill #0
  Filled 2014-04-12: qty 180, 30d supply, fill #1
  Filled 2014-05-10: qty 180, 30d supply, fill #2
  Filled 2014-06-08: qty 180, 30d supply, fill #3
  Filled 2014-07-05: qty 180, 30d supply, fill #4
  Filled 2014-08-02 – 2014-08-04 (×2): qty 180, 30d supply, fill #5
  Filled 2014-08-30: qty 180, 30d supply, fill #6
  Filled 2014-09-23 – 2014-09-27 (×2): qty 180, 30d supply, fill #7
  Filled 2014-11-03: qty 180, 30d supply, fill #8
  Filled 2014-12-08: qty 180, 30d supply, fill #9
  Filled 2015-02-02: qty 180, 30d supply, fill #10
  Filled 2015-02-28: qty 180, 30d supply, fill #11

## 2014-03-18 ENCOUNTER — Other Ambulatory Visit: Payer: Self-pay

## 2014-03-23 ENCOUNTER — Other Ambulatory Visit: Payer: Self-pay

## 2014-03-23 MED ORDER — INSULIN SYRINGE-NEEDLE U-100 HALF UNIT MARKING 0.3 ML 31 GAUGE X 5/16"
INJECTION | 3 refills | Status: AC
Start: 2014-03-23 — End: ?
  Filled 2014-04-12: qty 30, 30d supply, fill #0
  Filled 2014-05-10: qty 30, 30d supply, fill #1
  Filled 2014-06-08: qty 30, 30d supply, fill #2
  Filled 2014-07-05: qty 30, 30d supply, fill #3
  Filled 2014-08-02: qty 30, 30d supply, fill #4
  Filled 2014-08-30: qty 30, 30d supply, fill #5
  Filled 2014-09-23 – 2014-09-27 (×2): qty 30, 30d supply, fill #6
  Filled 2014-11-03: qty 30, 30d supply, fill #7
  Filled 2014-12-08: qty 30, 30d supply, fill #8
  Filled 2015-02-02: qty 30, 30d supply, fill #9
  Filled 2015-02-28 – 2015-03-16 (×2): qty 30, 30d supply, fill #10

## 2014-04-12 ENCOUNTER — Other Ambulatory Visit: Payer: Self-pay | Admitting: Surgery

## 2014-04-12 ENCOUNTER — Other Ambulatory Visit: Payer: Self-pay | Admitting: Specialist

## 2014-04-12 ENCOUNTER — Other Ambulatory Visit: Payer: Self-pay

## 2014-04-12 MED ORDER — MULTIVITAMIN TABLET
1.0000 | ORAL_TABLET | Freq: Every day | ORAL | 3 refills | Status: AC
Start: 2014-04-12 — End: 2015-04-07
  Filled 2014-04-12: qty 100, 100d supply, fill #0

## 2014-04-12 MED ORDER — BLOOD-GLUCOSE METER KIT
1.0000 | PACK | Freq: Once | 0 refills | Status: DC
Start: 2014-04-12 — End: 2014-06-08
  Filled 2014-04-12: qty 1, 30d supply, fill #0

## 2014-04-13 ENCOUNTER — Encounter: Payer: Self-pay | Admitting: Specialist

## 2014-04-27 LAB — OUTSIDE LABS
ALANINE TRANSFERASE (ALT): 20
ALBUMIN: 4.2
ALKALINE PHOSPHATASE (ALP): 88
ASPARTATE TRANSAMINASE (AST): 18
BILIRUBIN TOTAL: 0.6
CALCIUM: 9.3
CARBON DIOXIDE TOTAL: 28
CHLORIDE: 95
CREATINE KINASE: 104
CREATININE BLOOD: 0.93
CREATININE SPOT URINE: 48.3
GLUCOSE: 267
HEMATOCRIT: 42.8
HEMOGLOBIN: 13.8
LEUK. ESTERASE: NEGATIVE
MAGNESIUM (MG): 1.6
NEUTROPHIL ABS AUTO: 3.4
NEUTROPHILS % AUTO: 75
NITRITE URINE: NEGATIVE
PHOSPHORUS (PO4): 2.2
PLATELET COUNT: 179
POTASSIUM: 4.6
PROTEIN TOTAL SPOT URINE: 35.6
PROTEIN URINE: NEGATIVE
PROTEIN: 6.7
SODIUM: 134
SPECIFIC GRAVITY: 1.018
TACROLIMUS (FK506): 10.2
UREA NITROGEN, BLOOD (BUN): 13
WHITE BLOOD CELL COUNT: 4.6

## 2014-04-28 ENCOUNTER — Other Ambulatory Visit: Payer: Self-pay

## 2014-04-28 ENCOUNTER — Other Ambulatory Visit: Payer: Self-pay | Admitting: Nephrology

## 2014-04-28 MED ORDER — PRAVASTATIN 20 MG TABLET
20.0000 mg | ORAL_TABLET | Freq: Every day | ORAL | 5 refills | Status: DC
Start: 2014-04-28 — End: 2014-11-10
  Filled 2014-04-28: fill #0
  Filled 2014-05-10: qty 30, 30d supply, fill #0
  Filled 2014-06-08: qty 30, 30d supply, fill #1
  Filled 2014-07-05: qty 30, 30d supply, fill #2
  Filled 2014-08-02: qty 30, 30d supply, fill #3
  Filled 2014-08-30: qty 30, 30d supply, fill #4
  Filled 2014-09-23 – 2014-09-27 (×2): qty 30, 30d supply, fill #5

## 2014-04-29 ENCOUNTER — Other Ambulatory Visit: Payer: Self-pay

## 2014-04-29 MED ORDER — FAMOTIDINE 20 MG TABLET
20.0000 mg | ORAL_TABLET | Freq: Every day | ORAL | 6 refills | Status: DC
Start: 2014-04-29 — End: 2014-11-10
  Filled 2014-05-10: qty 30, 30d supply, fill #0
  Filled 2014-06-08: qty 30, 30d supply, fill #1
  Filled 2014-07-05: qty 30, 30d supply, fill #2
  Filled 2014-08-02: qty 30, 30d supply, fill #3
  Filled 2014-08-30: qty 30, 30d supply, fill #4
  Filled 2014-09-23 – 2014-09-27 (×2): qty 30, 30d supply, fill #5
  Filled 2014-11-03: qty 30, 30d supply, fill #6

## 2014-05-06 ENCOUNTER — Other Ambulatory Visit: Payer: Self-pay

## 2014-05-06 MED ORDER — FERROUS SULFATE 325 MG (65 MG IRON) TABLET
1.0000 | ORAL_TABLET | Freq: Two times a day (BID) | ORAL | 3 refills | Status: DC
Start: 2014-05-06 — End: 2014-09-27
  Filled 2014-05-06 – 2014-05-10 (×2): qty 60, 30d supply, fill #0
  Filled 2014-06-08: qty 60, 30d supply, fill #1
  Filled 2014-07-05: qty 60, 30d supply, fill #2
  Filled 2014-08-02: qty 60, 30d supply, fill #3

## 2014-05-06 MED ORDER — METOPROLOL TARTRATE 25 MG TABLET
12.5000 mg | ORAL_TABLET | Freq: Every day | ORAL | 6 refills | Status: DC
Start: 2014-05-06 — End: 2014-11-10
  Filled 2014-05-06 – 2014-05-10 (×2): qty 15, 30d supply, fill #0
  Filled 2014-06-08: qty 15, 30d supply, fill #1
  Filled 2014-07-05: qty 15, 30d supply, fill #2
  Filled 2014-08-02: qty 15, 30d supply, fill #3
  Filled 2014-08-30: qty 15, 30d supply, fill #4
  Filled 2014-09-23 – 2014-09-27 (×2): qty 15, 30d supply, fill #5
  Filled 2014-11-03: qty 15, 30d supply, fill #6

## 2014-05-10 ENCOUNTER — Other Ambulatory Visit: Payer: Self-pay

## 2014-06-08 ENCOUNTER — Other Ambulatory Visit: Payer: Self-pay

## 2014-06-08 ENCOUNTER — Other Ambulatory Visit: Payer: Self-pay | Admitting: Specialist

## 2014-06-08 MED ORDER — BLOOD SUGAR DIAGNOSTIC STRIPS
ORAL_STRIP | 5 refills | Status: DC
Start: 2014-06-08 — End: 2014-11-10
  Filled 2014-06-08: qty 100, 25d supply, fill #0
  Filled 2014-07-05: qty 100, 25d supply, fill #1
  Filled 2014-08-02: qty 100, 25d supply, fill #2
  Filled 2014-08-30: qty 100, 25d supply, fill #3
  Filled 2014-09-23 – 2014-09-27 (×2): qty 100, 25d supply, fill #4
  Filled 2014-11-03: qty 100, 25d supply, fill #5

## 2014-06-08 MED ORDER — LANCETS
5 refills | Status: DC
Start: 2014-06-08 — End: 2014-11-10
  Filled 2014-06-08: qty 100, 25d supply, fill #0
  Filled 2014-07-05: qty 100, 25d supply, fill #1
  Filled 2014-08-02: qty 100, 25d supply, fill #2
  Filled 2014-08-30: qty 100, 25d supply, fill #3
  Filled 2014-09-23 – 2014-09-27 (×2): qty 100, 25d supply, fill #4
  Filled 2014-11-03: qty 100, 25d supply, fill #5

## 2014-06-08 MED ORDER — BLOOD-GLUCOSE METER KIT
1.0000 | PACK | Freq: Once | 0 refills | Status: DC
Start: 2014-06-08 — End: 2017-02-05
  Filled 2014-06-08: qty 1, 30d supply, fill #0

## 2014-07-05 ENCOUNTER — Other Ambulatory Visit: Payer: Self-pay

## 2014-07-07 ENCOUNTER — Other Ambulatory Visit: Payer: Self-pay

## 2014-07-07 DIAGNOSIS — E119 Type 2 diabetes mellitus without complications: Principal | ICD-10-CM

## 2014-07-16 NOTE — Addendum Note (Signed)
Addended by: Wendi Snipes on: 07/16/2014 10:33 AM      Modules accepted: Level of Service

## 2014-07-18 ENCOUNTER — Other Ambulatory Visit: Payer: Self-pay

## 2014-07-20 ENCOUNTER — Other Ambulatory Visit: Payer: Self-pay

## 2014-07-20 DIAGNOSIS — E119 Type 2 diabetes mellitus without complications: Principal | ICD-10-CM

## 2014-07-29 ENCOUNTER — Other Ambulatory Visit: Payer: Self-pay

## 2014-08-02 ENCOUNTER — Other Ambulatory Visit: Payer: Self-pay

## 2014-08-03 ENCOUNTER — Other Ambulatory Visit: Payer: Self-pay

## 2014-08-03 DIAGNOSIS — E119 Type 2 diabetes mellitus without complications: Principal | ICD-10-CM

## 2014-08-04 ENCOUNTER — Other Ambulatory Visit: Payer: Self-pay

## 2014-08-30 ENCOUNTER — Other Ambulatory Visit: Payer: Self-pay

## 2014-09-21 ENCOUNTER — Other Ambulatory Visit: Payer: Self-pay

## 2014-09-21 ENCOUNTER — Telehealth: Payer: Self-pay

## 2014-09-21 MED ORDER — DULOXETINE 30 MG CAPSULE,DELAYED RELEASE
1.0000 | DELAYED_RELEASE_CAPSULE | Freq: Every day | ORAL | 2 refills | Status: DC
Start: 2014-09-21 — End: 2014-12-07
  Filled 2014-09-23 – 2014-10-08 (×3): qty 30, 30d supply, fill #0
  Filled 2014-11-03: qty 30, 30d supply, fill #1

## 2014-09-22 NOTE — Telephone Encounter (Signed)
POST TRANSPLANT NUTRITION FOLLOW-UP  09/21/2014    Nutrition Assessment     Charles Galvan is a [age]yr old male who is status post DDRT on 12/22/2012. Last seen in transplant clinic March 2015 and noted to have stable graft function. Nutrition consult from pharmacist, after patient report 20-30 pounds unintentional weight loss.    Patient Report:   Patient states that he weighs 130 pounds today. He was in the 160's before transplant, so he's lost a lot of weight. Patient says that he had teeth extracted 3 weeks ago (only 1 moler on one side, only a few teeth up front) and this has made eating difficult. But even before that, pt was losing weight. He started snacking less in August because he got busy. Has fair appetite, a "six out of ten."    Pertinent Medical History:    Client Medical History: ESRD s/p DDRT, HTN, T2DM, HLP, Gerd, Gastroparesis.     Pertinent Labs:   Results for Charles SicCANTU Galvan, Cammeron J (MRN [id number]) as of 09/22/2014 14:11   Ref. Range 04/12/2014 11:44   SODIUM No range found 134   POTASSIUM No range found 4.6   CHLORIDE No range found 95   CARBON DIOXIDE TOTAL No range found 28   UREA NITROGEN, BLOOD (BUN) No range found 13   CREATININE BLOOD No range found 0.93   GLUCOSE No range found 267   PHOSPHORUS (PO4) No range found 2.2   CALCIUM No range found 9.3   PROTEIN No range found 6.7   ALBUMIN No range found 4.2   Magnesium 1.6  No recent HgbA1c available - pt reports he thinks it was 12-13% recently    Pertinent Meds:   MMF, tacrolimus, Lantus, Novolog, ferrous sulfate, MVI, pravastatin    Nutrition Focused Physical Findings:  Digestive Systems: +diarrhea    Anthropometrics:  Pretransplant admission weight : 75.2 kg (165 pounds)   Pt reports current weight 130 pounds.  Wt Readings from Last 5 Encounters:   01/13/14 73.3 kg (161 lb 9.6 oz)   12/31/13 70.308 kg (155 lb)   12/01/13 71.5 kg (157 lb 10.1 oz)   04/06/13 67.132 kg (148 lb)   03/02/13 64.638 kg (142 lb 8 oz)   Ht 63 inches  BMI: 23  Weight History:  weight change of -30 pounds (18%) over the past 8 months.    Food & Nutrition Related History:    Previously prescribed diet: diabetic, posttransplant food safety guidelines  Previous nutrition education/counseling: post-transplant education with inpatient RD.    Diet Recall:   Meal/Snack pattern: 2 meals per day  Breakfast: none, just coffee  Lunch: chicken noodle soup (1 can)  Dinner: Malaw[place]urkey sandwich  Snacks: -  Beverages: water, sugar free rockstar, coffee  Oral Supplements: none    Estimated Needs  Miff x 1.3 = 1768 calories per day for wt maintenance  2260 calories per day for weight gain  60+ g protein per day (1+ g/kg)    Nutrition Diagnosis     1. Inadequate energy intake related to limited food choices with poor dentition and suboptimal appetite as evidenced by diet recall.  N1.1-4  2. Unintentional weight loss related to decreased PO intake and appetite as evidenced by 18% unintentional loss of 30 pounds in the past 8 months.  NC-3.2    Summary of Assessment:  Patient is at risk for malnutrition given is significant weight loss of 18% over 8 months, as well as inadequate energy intake demonstrated by  his suboptimal diet recall. Additionally, patient's recent teeth extraction makes eating difficulty, he seems to be avoiding meat and is not eating enough protein. Patient does not have any future transplant visits scheduled, thus encouraged him to seek a dietitian appointment [facility]. This dietitian will also try call patient in 2 weeks to see how he is doing. Unable to verify that patient has indeed lost 30-pounds, as [facility].    Nutrition Intervention     1. Nutrition Education:    Discussed strategies for weight maintenance and gain   Start oral nutrition supplement. Since this will be a meal replacement (for breakfast), patient should get Ensure Plus, not Glucerna or a diabetic shake, to maximize calorie intake. He can cover with insulin as he  would a meal. Recommend 2 Ensure Plus daily for 720 calories, 30 g protein.   Discussed foods to add for more calories: more mayo on sandwiches, add oil to soups, choose creamy soups not broth ones.    Pt is on diabetic diet, however he needs to gain weight, not over-restrict foods.    Education needs identified: Yes  Barriers to learning assessed: none  Patient verbalizes understanding of teaching instructions: Yes  Patient agreed to recommendations: Yes  Handouts provided: High Calorie Eating (via mail), BOost coupon    Nutrition Monitoring & Evaluation     1. Meal/snack pattern -high calorie  1. Add mayo, oil to soups, choose higher calorie creamy soups   2. Weight - no further loss. Maintain > current 130 pounds with goal of usual body weight ~160 pounds   3. Oral supplements: 2 Boost/Ensure Plus daily for 720 calories and 30 g protein    Johnny Bridgeachel Perez, MS, RD  Outpatient Dietitian  Pager: [phone]

## 2014-09-23 ENCOUNTER — Other Ambulatory Visit: Payer: Self-pay

## 2014-09-27 ENCOUNTER — Other Ambulatory Visit: Payer: Self-pay

## 2014-09-27 MED ORDER — INSULIN GLARGINE 100 UNIT/ML SUB-Q VIAL
SUBCUTANEOUS | 5 refills | Status: DC
Start: 2014-09-27 — End: 2015-08-26
  Filled 2014-09-27: qty 10, 30d supply, fill #0
  Filled 2014-11-03: qty 10, 30d supply, fill #1
  Filled 2014-12-08: qty 10, 30d supply, fill #2
  Filled 2015-02-02: qty 10, 28d supply, fill #3
  Filled 2015-02-28 – 2015-03-16 (×2): qty 10, 28d supply, fill #4
  Filled 2015-08-16 – 2015-08-17 (×2): qty 10, 28d supply, fill #5

## 2014-09-27 MED ORDER — INSULIN ASPART (U-100) 100 UNIT/ML (3 ML) SUBCUTANEOUS PEN
PEN_INJECTOR | SUBCUTANEOUS | 5 refills | Status: AC
Start: 2014-09-27 — End: 2015-09-20
  Filled 2014-09-27: qty 15, 30d supply, fill #0
  Filled 2014-11-03: qty 15, 30d supply, fill #1
  Filled 2014-12-08: qty 15, 30d supply, fill #2
  Filled 2015-02-02: qty 15, 30d supply, fill #3
  Filled 2015-02-28 – 2015-03-16 (×2): qty 15, 30d supply, fill #4
  Filled 2015-08-16 – 2015-08-17 (×3): qty 15, 30d supply, fill #5

## 2014-09-27 MED ORDER — FERROUS SULFATE 325 MG (65 MG IRON) TABLET
1.0000 | ORAL_TABLET | Freq: Two times a day (BID) | ORAL | 11 refills | Status: AC
Start: 2014-09-27 — End: 2015-09-28
  Filled 2014-09-27: qty 60, 30d supply, fill #0
  Filled 2014-11-03: qty 60, 30d supply, fill #1
  Filled 2014-12-08: qty 60, 30d supply, fill #2
  Filled 2015-02-02: qty 60, 30d supply, fill #3
  Filled 2015-02-28 – 2015-03-16 (×2): qty 60, 30d supply, fill #4
  Filled 2015-04-18: qty 60, 30d supply, fill #5
  Filled 2015-08-16 – 2015-08-17 (×2): qty 60, 30d supply, fill #6

## 2014-09-29 ENCOUNTER — Other Ambulatory Visit: Payer: Self-pay

## 2014-10-08 ENCOUNTER — Other Ambulatory Visit: Payer: Self-pay

## 2014-11-02 ENCOUNTER — Telehealth: Payer: Self-pay

## 2014-11-02 NOTE — Telephone Encounter (Signed)
11/02/2014  Nutrition Follow Up - Telephone Call    Charles Galvan is a [age]yr old male who is status post DDRT on 12/22/2012 with stable graft function. RD called in December due to patient's concern for posttransplant 20-30 pound loss. See note from September 21, 2014.    Follow call today to check on patient's appetite, weight stability.  Requested pt to call RD line back if further questions.    Johnny Bridgeachel Perez, MS, RD  Outpatient Dietitian  Pager: [phone]

## 2014-11-03 ENCOUNTER — Other Ambulatory Visit: Payer: Self-pay

## 2014-11-05 ENCOUNTER — Other Ambulatory Visit: Payer: Self-pay

## 2014-11-05 ENCOUNTER — Other Ambulatory Visit: Payer: Self-pay | Admitting: Nephrology

## 2014-11-05 DIAGNOSIS — I10 Essential (primary) hypertension: Principal | ICD-10-CM

## 2014-11-10 ENCOUNTER — Other Ambulatory Visit: Payer: Self-pay

## 2014-11-10 MED ORDER — BLOOD SUGAR DIAGNOSTIC STRIPS
ORAL_STRIP | 5 refills | Status: AC
Start: 2014-11-10 — End: 2015-11-26
  Filled 2014-12-08: qty 100, 25d supply, fill #0
  Filled 2015-02-02: qty 100, 25d supply, fill #1
  Filled 2015-02-28 – 2015-03-16 (×2): qty 100, 25d supply, fill #2
  Filled 2015-04-18 – 2015-04-19 (×2): qty 100, 25d supply, fill #3
  Filled 2015-08-16 – 2015-09-29 (×2): qty 100, 25d supply, fill #4
  Filled 2015-11-01: qty 100, 25d supply, fill #5

## 2014-11-10 MED ORDER — PEN NEEDLE, DIABETIC 31 GAUGE X 3/16"
1.0000 | Freq: Four times a day (QID) | 11 refills | Status: AC | PRN
Start: 2014-11-10 — End: 2015-11-26
  Filled 2014-11-10 – 2014-12-08 (×2): qty 100, 25d supply, fill #0
  Filled 2015-02-02: qty 100, 25d supply, fill #1
  Filled 2015-02-28 – 2015-03-16 (×2): qty 100, 25d supply, fill #2
  Filled 2015-04-18: qty 100, 25d supply, fill #3
  Filled 2015-08-16 – 2015-08-17 (×2): qty 100, 25d supply, fill #4
  Filled 2015-09-29: qty 100, 25d supply, fill #5
  Filled 2015-11-01: qty 100, 25d supply, fill #6

## 2014-11-10 MED ORDER — METOPROLOL TARTRATE 25 MG TABLET
12.5000 mg | ORAL_TABLET | Freq: Every day | ORAL | 6 refills | Status: DC
Start: 2014-11-10 — End: 2015-12-01
  Filled 2014-12-08: qty 15, 30d supply, fill #0
  Filled 2015-02-02: qty 15, 30d supply, fill #1
  Filled 2015-02-28 – 2015-03-16 (×2): qty 15, 30d supply, fill #2
  Filled 2015-04-18: qty 15, 30d supply, fill #3
  Filled 2015-08-16 – 2015-08-17 (×2): qty 15, 30d supply, fill #4
  Filled 2015-09-29: qty 15, 30d supply, fill #5
  Filled 2015-11-01: qty 15, 30d supply, fill #6

## 2014-11-10 MED ORDER — FAMOTIDINE 20 MG TABLET
20.0000 mg | ORAL_TABLET | Freq: Every day | ORAL | 6 refills | Status: AC
Start: 2014-11-10 — End: 2017-02-05
  Filled 2014-12-08: qty 30, 30d supply, fill #0
  Filled 2015-02-02: qty 30, 30d supply, fill #1
  Filled 2015-02-28 – 2015-03-16 (×2): qty 30, 30d supply, fill #2
  Filled 2015-04-18: qty 30, 30d supply, fill #3
  Filled 2015-08-16 – 2015-08-17 (×2): qty 30, 30d supply, fill #4
  Filled 2015-09-29: qty 30, 30d supply, fill #5
  Filled 2015-11-01: qty 30, 30d supply, fill #6

## 2014-11-10 MED ORDER — LANCETS
5 refills | Status: AC
Start: 2014-11-10 — End: 2015-11-26
  Filled 2014-12-08: qty 100, 25d supply, fill #0
  Filled 2015-02-02: qty 100, 25d supply, fill #1
  Filled 2015-02-28 – 2015-03-16 (×2): qty 100, 25d supply, fill #2
  Filled 2015-04-18 – 2015-04-19 (×2): qty 100, 25d supply, fill #3
  Filled 2015-08-16 – 2015-09-29 (×2): qty 100, 25d supply, fill #4
  Filled 2015-11-01: qty 100, 25d supply, fill #5

## 2014-11-10 MED ORDER — PRAVASTATIN 20 MG TABLET
20.0000 mg | ORAL_TABLET | Freq: Every day | ORAL | 5 refills | Status: AC
Start: 2014-11-10 — End: 2015-11-05
  Filled 2014-12-08: qty 30, 30d supply, fill #0
  Filled 2015-02-02: qty 30, 30d supply, fill #1
  Filled 2015-02-28 – 2015-03-16 (×2): qty 30, 30d supply, fill #2
  Filled 2015-04-18: qty 30, 30d supply, fill #3
  Filled 2015-08-16 – 2015-08-17 (×2): qty 30, 30d supply, fill #4
  Filled 2015-09-29: qty 30, 30d supply, fill #5

## 2014-11-11 ENCOUNTER — Other Ambulatory Visit: Payer: Self-pay

## 2014-11-26 ENCOUNTER — Other Ambulatory Visit: Payer: Self-pay

## 2014-11-26 MED ORDER — DULOXETINE 30 MG CAPSULE,DELAYED RELEASE
1.0000 | DELAYED_RELEASE_CAPSULE | Freq: Every day | ORAL | 2 refills | Status: DC
Start: 2014-11-26 — End: 2015-03-29
  Filled 2014-12-08: qty 30, 30d supply, fill #0
  Filled 2015-02-02: qty 30, 30d supply, fill #1
  Filled 2015-02-28 – 2015-03-16 (×2): qty 30, 30d supply, fill #2

## 2014-12-07 ENCOUNTER — Other Ambulatory Visit: Payer: Self-pay

## 2014-12-08 ENCOUNTER — Other Ambulatory Visit: Payer: Self-pay

## 2014-12-08 ENCOUNTER — Other Ambulatory Visit: Payer: Self-pay | Admitting: Specialist

## 2014-12-10 MED ORDER — VALSARTAN 40 MG TABLET
40.0000 mg | ORAL_TABLET | Freq: Every day | ORAL | 11 refills | Status: DC
Start: 2014-12-10 — End: 2015-12-05
  Filled 2014-12-10: fill #0
  Filled 2015-02-02: qty 30, 30d supply, fill #0
  Filled 2015-02-28 – 2015-03-16 (×2): qty 30, 30d supply, fill #1
  Filled 2015-04-18: qty 30, 30d supply, fill #2
  Filled 2015-08-16 – 2015-08-17 (×2): qty 30, 30d supply, fill #3
  Filled 2015-09-29: qty 30, 30d supply, fill #4
  Filled 2015-11-01: qty 30, 30d supply, fill #5

## 2015-02-02 ENCOUNTER — Other Ambulatory Visit: Payer: Self-pay

## 2015-02-08 ENCOUNTER — Other Ambulatory Visit: Payer: Self-pay | Admitting: Specialist

## 2015-02-08 ENCOUNTER — Other Ambulatory Visit: Payer: Self-pay

## 2015-02-08 DIAGNOSIS — Z940 Kidney transplant status: Principal | ICD-10-CM

## 2015-02-11 MED ORDER — TACROLIMUS 1 MG CAPSULE, IMMEDIATE-RELEASE
4.0000 mg | ORAL_CAPSULE | Freq: Two times a day (BID) | ORAL | 11 refills | Status: DC
Start: 2015-02-11 — End: 2016-02-13
  Filled 2015-02-11: fill #0
  Filled 2015-02-28 – 2015-03-16 (×2): qty 240, 30d supply, fill #0
  Filled 2015-04-18 – 2015-04-19 (×2): qty 240, 30d supply, fill #1
  Filled 2015-08-16 – 2015-08-17 (×2): qty 240, 30d supply, fill #2
  Filled 2015-09-29: qty 240, 30d supply, fill #3
  Filled 2015-11-01: qty 240, 30d supply, fill #4
  Filled 2015-11-30: qty 240, 30d supply, fill #5
  Filled 2015-12-28: qty 240, 30d supply, fill #6
  Filled 2016-01-24: qty 240, 30d supply, fill #7

## 2015-02-28 ENCOUNTER — Other Ambulatory Visit: Payer: Self-pay

## 2015-03-16 ENCOUNTER — Other Ambulatory Visit: Payer: Self-pay | Admitting: Nephrology

## 2015-03-16 ENCOUNTER — Other Ambulatory Visit: Payer: Self-pay

## 2015-03-16 DIAGNOSIS — Z940 Kidney transplant status: Principal | ICD-10-CM

## 2015-03-16 MED ORDER — MYCOPHENOLATE MOFETIL 250 MG CAPSULE
750.0000 mg | ORAL_CAPSULE | Freq: Two times a day (BID) | ORAL | 11 refills | Status: DC
Start: 2015-03-16 — End: 2016-04-09
  Filled 2015-03-16: qty 180, 30d supply, fill #0
  Filled 2015-04-18 – 2015-04-19 (×2): qty 180, 30d supply, fill #1
  Filled 2015-08-16 – 2015-08-17 (×2): qty 180, 30d supply, fill #2
  Filled 2015-09-29: qty 180, 30d supply, fill #3
  Filled 2015-11-01: qty 180, 30d supply, fill #4
  Filled 2015-11-30: qty 180, 30d supply, fill #5
  Filled 2015-12-28: qty 180, 30d supply, fill #6
  Filled 2016-01-24: qty 180, 30d supply, fill #7
  Filled 2016-02-17: qty 180, 30d supply, fill #8
  Filled 2016-03-13: qty 180, 30d supply, fill #9

## 2015-03-22 ENCOUNTER — Other Ambulatory Visit: Payer: Self-pay

## 2015-03-29 MED ORDER — DULOXETINE 30 MG CAPSULE,DELAYED RELEASE
1.0000 | DELAYED_RELEASE_CAPSULE | Freq: Every day | ORAL | 2 refills | Status: DC
Start: 2015-03-29 — End: 2020-12-19
  Filled 2015-04-18: qty 30, 30d supply, fill #0
  Filled 2015-08-16 – 2015-08-17 (×2): qty 30, 30d supply, fill #1
  Filled 2015-09-29: qty 30, 30d supply, fill #2

## 2015-04-06 ENCOUNTER — Other Ambulatory Visit: Payer: Self-pay

## 2015-04-18 ENCOUNTER — Other Ambulatory Visit: Payer: Self-pay

## 2015-04-19 ENCOUNTER — Other Ambulatory Visit: Payer: Self-pay

## 2015-04-26 ENCOUNTER — Other Ambulatory Visit: Payer: Self-pay

## 2015-04-27 ENCOUNTER — Telehealth: Payer: Self-pay

## 2015-04-27 NOTE — Telephone Encounter (Signed)
[place]:  Pharmacist Reassessment    Charles Galvan  is a 49yr old patient with a diagnosis of renal txp, for which patient has been prescribed MMF+FK.      Medication Reconciliation:  To be done with next monthly refill adherence call     Current Outpatient Prescriptions   Medication Sig Dispense Refill    Blood Glucose Meter (ACCU-CHEK AVIVA PLUS) Kit 1 kit one time. 1 kit 0    Blood Sugar Diagnostic (ACCU-CHEK AVIVA PLUS TEST STRP) Strips Use to test blood glucose 4 times daily 100 strip 5    Duloxetine (CYMBALTA) 30 mg Delayed Release Capsule Take 1 capsule by mouth every day. 30 capsule 2    FamoTIDine (PEPCID) 20 mg Tablet Tablet Take 1 tablet by mouth every morning. 30 tablet 6    ferrous sulfate 325 mg (65 mg iron) Tablet Take 1 tablet by mouth 2 times daily. 60 tablet 11    Insulin Aspart (NOVOLOG FLEXPEN) 100 unit/mL Pen Inject subcutaneously 3 times a day WITH MEALS: < 70, drink juice, 70-130, 0 units; 131-150, 1 unit; 151-180, 2 units; 181-210,3 units; 211-240,4 units; FSBG 241-270, 5 units; FSBG 271-300,6 units; 301-330, 7 units; 331-360,8 units; 361-400,10 units;> 400,12 units. 15 mL 5    Insulin Glargine (LANTUS) 100 unit/mL Vial Inject 10 units subcutaneously daily 10 mL 5    Insulin Pen Needles, Disposable, (BD ULTRA-FINE III PEN NEEDLE) 31 gauge x 3/16" Use 1 pen needle three to four times daily or as directed. for insulin administration via insulin pen 100 each 11    insulin syr/ndl U100 half mark (BD INSULIN SYRINGE HALF UNIT MARKINGS) 0.3 mL 31 gauge x 5/16" Syringe use 1 syringe to inject lantus daily at bedtime 100 syringe 3    Lancets (ACCU-CHEK SOFTCLIX LANCETS) Use to test blood glucose 4 times daily 100 each 5    Metoprolol Tartrate (LOPRESSOR) 25 mg Tablet Take 0.5 tablet (1/2 tablet) by mouth every day at bedtime. 15 tablet 6    Mycophenolate (MMF) 250 mg Capsule Take 3 capsules by mouth 2 times daily. Or as directed. Do NOT take calcium or antacids  within 2 hours of any dose. (SANDOZ ONLY; DO NOT CHANGE MFG FOR REFILLS) +++ ICD 9: V 42.0 +++ 180 capsule 11    Pravastatin (PRAVACHOL) 20 mg Tablet Take 1 tablet by mouth every morning. 30 tablet 5    Tacrolimus (PROGRAF) 1 mg Capsule Take 4 capsules by mouth 2 times daily. Or as directed by MD. Take doses every 12 hours. Take morning dose AFTER blood draw on lab days. (SANDOZ ONLY; DO NOT CHANGE MFG FOR REFILLS) +++ ICD 9: V 42.0 +++ 240 capsule 11    Valsartan (DIOVAN) 40 mg Tablet Take 1 tablet by mouth every day. 30 tablet 11     No current facility-administered medications for this visit.       Assessment of continued appropriateness of therapy:  See chart note 01/13/14    Assessment of continued effectiveness of therapy:  See chart note    Assessment of occurrence of medication adverse effects:  See chart note    Assessment of patient's medication adherence:  See chart note    Reassessment due in 48 weeks, approximately 04/2016.      Specialty Pharmacy Care Management Plan  Continue with RAMP program and MD follow-up

## 2015-06-15 ENCOUNTER — Telehealth: Payer: Self-pay

## 2015-06-15 NOTE — Telephone Encounter (Signed)
[facility] Specialty Pharmacy Refill Reassessment    Discussed the following with the patient:  1. Issues (if any) patient has had with taking medication as prescribed:   None  2. Side effects (if any) experienced while on treatment:  None  3. Changes to medical history or any new prescription or non-prescription medications or herbal supplements:  None  4. How patient is currently taking specialty medication (dose/frequency):  FK  4c bid MMF  3c bid  5. Patient's perception of effectiveness of medication therapy:  good  Offer was made to patient to speak with a clinical pharmacist.  Patient declined to speak with a pharmacist at this time.

## 2015-06-16 NOTE — Telephone Encounter (Signed)
[place]:  Pharmacist Reassessment    Charles Galvan  is a 49yr old patient with a diagnosis of kidney transplantation, for which patient has been prescribed tacrolimus and mycophenolate.      Medication Reconciliation:  Patient's prescription and non-prescription medications and natural supplements were reviewed with the patient  by a pharmacy technician and medication list was updated.       Current Outpatient Prescriptions   Medication Sig Dispense Refill    Blood Glucose Meter (ACCU-CHEK AVIVA PLUS) Kit 1 kit one time. 1 kit 0    Blood Sugar Diagnostic (ACCU-CHEK AVIVA PLUS TEST STRP) Strips Use to test blood glucose 4 times daily 100 strip 5    Duloxetine (CYMBALTA) 30 mg Delayed Release Capsule Take 1 capsule by mouth every day. 30 capsule 2    FamoTIDine (PEPCID) 20 mg Tablet Tablet Take 1 tablet by mouth every morning. 30 tablet 6    ferrous sulfate 325 mg (65 mg iron) Tablet Take 1 tablet by mouth 2 times daily. 60 tablet 11    Insulin Aspart (NOVOLOG FLEXPEN) 100 unit/mL Pen Inject subcutaneously 3 times a day WITH MEALS: < 70, drink juice, 70-130, 0 units; 131-150, 1 unit; 151-180, 2 units; 181-210,3 units; 211-240,4 units; FSBG 241-270, 5 units; FSBG 271-300,6 units; 301-330, 7 units; 331-360,8 units; 361-400,10 units;> 400,12 units. 15 mL 5    Insulin Glargine (LANTUS) 100 unit/mL Vial Inject 10 units subcutaneously daily 10 mL 5    Insulin Pen Needles, Disposable, (BD ULTRA-FINE III PEN NEEDLE) 31 gauge x 3/16" Use 1 pen needle three to four times daily or as directed. for insulin administration via insulin pen 100 each 11    insulin syr/ndl U100 half mark (BD INSULIN SYRINGE HALF UNIT MARKINGS) 0.3 mL 31 gauge x 5/16" Syringe use 1 syringe to inject lantus daily at bedtime 100 syringe 3    Lancets (ACCU-CHEK SOFTCLIX LANCETS) Use to test blood glucose 4 times daily 100 each 5    Mycophenolate (MMF) 250 mg Capsule Take 3 capsules by mouth 2 times daily. Or as directed.  Do NOT take calcium or antacids within 2 hours of any dose. (SANDOZ ONLY; DO NOT CHANGE MFG FOR REFILLS) +++ ICD 9: V 42.0 +++ 180 capsule 11    Pravastatin (PRAVACHOL) 20 mg Tablet Take 1 tablet by mouth every morning. 30 tablet 5    Tacrolimus (PROGRAF) 1 mg Capsule Take 4 capsules by mouth 2 times daily. Or as directed by MD. Take doses every 12 hours. Take morning dose AFTER blood draw on lab days. (SANDOZ ONLY; DO NOT CHANGE MFG FOR REFILLS) +++ ICD 9: V 42.0 +++ 240 capsule 11    Valsartan (DIOVAN) 40 mg Tablet Take 1 tablet by mouth every day. 30 tablet 11     No current facility-administered medications for this visit.        Assessment of continued appropriateness of therapy: See chart note    Assessment of continued effectiveness of therapy: See chart note    Assessment of occurrence of medication adverse effects: See chart note    Assessment of patient's medication adherence: See chart note    Reassessment due in 48 weeks, approximately 05/2016.     Specialty Pharmacy Care Management Plan  Continue with RAMP program and MD follow-up    Ralph Leyden, PharmD

## 2015-08-16 ENCOUNTER — Other Ambulatory Visit: Payer: Self-pay

## 2015-08-17 ENCOUNTER — Other Ambulatory Visit: Payer: Self-pay

## 2015-08-22 ENCOUNTER — Other Ambulatory Visit: Payer: Self-pay

## 2015-08-22 ENCOUNTER — Encounter: Payer: Self-pay | Admitting: Nephrology

## 2015-08-22 LAB — OUTSIDE LABS
CALCIUM: 8.7
CARBON DIOXIDE TOTAL: 28
CHLORIDE: 101
CREATININE BLOOD: 0.76
GLUCOSE: 231
HEMATOCRIT: 44.7
HEMOGLOBIN: 14.7
NEUTROPHIL ABS AUTO: 3.6
NEUTROPHILS % AUTO: 70
PLATELET COUNT: 182
POTASSIUM: 4.1
SODIUM: 138
TACROLIMUS (FK506): 9.3
UREA NITROGEN, BLOOD (BUN): 20
WHITE BLOOD CELL COUNT: 5.1

## 2015-08-23 ENCOUNTER — Other Ambulatory Visit: Payer: Self-pay

## 2015-08-26 MED ORDER — INSULIN GLARGINE 100 UNIT/ML SUB-Q VIAL
SUBCUTANEOUS | 6 refills | Status: AC
Start: 2015-08-26 — End: 2017-02-05
  Filled 2015-09-29: qty 10, 28d supply, fill #0
  Filled 2015-11-01: qty 10, 28d supply, fill #1

## 2015-08-30 ENCOUNTER — Encounter: Payer: Self-pay | Admitting: Specialist

## 2015-08-30 LAB — OUTSIDE LABS
ALANINE TRANSFERASE (ALT): 21
ALBUMIN: 3.8
ALKALINE PHOSPHATASE (ALP): 128
ASPARTATE TRANSAMINASE (AST): 17
BILIRUBIN TOTAL: 0.5
CALCIUM: 9.2
CARBON DIOXIDE TOTAL: 28
CHLORIDE: 102
CREATINE KINASE: 95
CREATININE BLOOD: 0.82
CREATININE SPOT URINE: 41
GLUCOSE URINE: NEGATIVE
GLUCOSE: 276
HEMATOCRIT: 48.6
HEMOGLOBIN: 16.3
HGB A1C: 11.1
LEUK. ESTERASE: NEGATIVE
MAGNESIUM (MG): 1.7
NEUTROPHIL ABS AUTO: 5.4
NEUTROPHILS % AUTO: 77
NITRITE URINE: NEGATIVE
OCCULT BLOOD URINE: NEGATIVE
PHOSPHORUS (PO4): 3.1
PLATELET COUNT: 175
POTASSIUM: 4.2
PROTEIN TOTAL SPOT URINE: 8
PROTEIN URINE: NEGATIVE
PROTEIN: 7.3
RBC/HPF: NONE SEEN
SODIUM: 141
SPECIFIC GRAVITY: 1.012
TACROLIMUS (FK506): 7.7
UREA NITROGEN, BLOOD (BUN): 16
WBC/HPF: NONE SEEN
WHITE BLOOD CELL COUNT: 7

## 2015-09-12 ENCOUNTER — Encounter: Payer: Self-pay | Admitting: Nephrology

## 2015-09-21 ENCOUNTER — Ambulatory Visit: Payer: Medicare Other

## 2015-09-29 ENCOUNTER — Other Ambulatory Visit: Payer: Self-pay

## 2015-09-30 ENCOUNTER — Other Ambulatory Visit: Payer: Self-pay

## 2015-10-19 ENCOUNTER — Encounter: Payer: Self-pay | Admitting: Nephrology

## 2015-10-26 ENCOUNTER — Encounter: Payer: Self-pay | Admitting: Transplant Surgery

## 2015-11-01 ENCOUNTER — Other Ambulatory Visit: Payer: Self-pay

## 2015-11-04 ENCOUNTER — Other Ambulatory Visit: Payer: Self-pay

## 2015-11-16 ENCOUNTER — Ambulatory Visit: Payer: Medicare Other

## 2015-11-21 ENCOUNTER — Ambulatory Visit: Payer: Medicare Other

## 2015-11-25 ENCOUNTER — Other Ambulatory Visit: Payer: Self-pay

## 2015-11-29 ENCOUNTER — Other Ambulatory Visit: Payer: Self-pay

## 2015-11-30 ENCOUNTER — Other Ambulatory Visit: Payer: Self-pay

## 2015-12-09 ENCOUNTER — Other Ambulatory Visit: Payer: Self-pay

## 2015-12-09 MED ORDER — FERROUS SULFATE 325 MG (65 MG IRON) TABLET
1.0000 | ORAL_TABLET | Freq: Two times a day (BID) | ORAL | 11 refills | Status: DC
Start: 2015-12-09 — End: 2020-12-19

## 2015-12-09 MED ORDER — PRAVASTATIN 20 MG TABLET
1.0000 | ORAL_TABLET | Freq: Every day | ORAL | 5 refills | Status: DC
Start: 2015-12-09 — End: 2020-12-19
  Filled 2015-12-09: qty 30, 30d supply, fill #0

## 2015-12-09 MED ORDER — INSULIN SYRINGE U-100 WITH NEEDLE 0.3 ML 31 GAUGE X 5/16"
INJECTION | 3 refills | Status: AC
Start: 2015-12-09 — End: ?

## 2015-12-15 ENCOUNTER — Other Ambulatory Visit: Payer: Self-pay | Admitting: Specialist

## 2015-12-15 ENCOUNTER — Other Ambulatory Visit: Payer: Self-pay

## 2015-12-19 ENCOUNTER — Other Ambulatory Visit: Payer: Self-pay

## 2015-12-19 MED ORDER — VALSARTAN 40 MG TABLET
40.0000 mg | ORAL_TABLET | Freq: Every day | ORAL | 3 refills | Status: DC
Start: 2015-12-19 — End: 2017-02-05
  Filled 2015-12-19: fill #0

## 2015-12-26 ENCOUNTER — Other Ambulatory Visit: Payer: Self-pay

## 2015-12-27 ENCOUNTER — Other Ambulatory Visit: Payer: Self-pay

## 2015-12-28 ENCOUNTER — Other Ambulatory Visit: Payer: Self-pay

## 2016-01-24 ENCOUNTER — Other Ambulatory Visit: Payer: Self-pay

## 2016-02-13 ENCOUNTER — Other Ambulatory Visit: Payer: Self-pay | Admitting: Specialist

## 2016-02-13 ENCOUNTER — Other Ambulatory Visit: Payer: Self-pay

## 2016-02-13 DIAGNOSIS — Z940 Kidney transplant status: Principal | ICD-10-CM

## 2016-02-13 MED ORDER — TACROLIMUS 1 MG CAPSULE, IMMEDIATE-RELEASE
4.0000 mg | ORAL_CAPSULE | Freq: Two times a day (BID) | ORAL | 11 refills | Status: DC
Start: 2016-02-13 — End: 2017-01-09
  Filled 2016-02-13: fill #0
  Filled 2016-02-17: qty 240, 30d supply, fill #0
  Filled 2016-03-13: qty 240, 30d supply, fill #1
  Filled 2016-04-09: qty 240, 30d supply, fill #2
  Filled 2016-05-22: qty 240, 30d supply, fill #3
  Filled 2016-06-27: qty 240, 30d supply, fill #4
  Filled 2016-07-31: qty 240, 30d supply, fill #5
  Filled 2016-08-24: qty 240, 30d supply, fill #6
  Filled 2016-09-18: qty 240, 30d supply, fill #7
  Filled 2016-10-16: qty 240, 30d supply, fill #8
  Filled 2016-11-13: qty 240, 30d supply, fill #9
  Filled 2016-12-10: qty 240, 30d supply, fill #10
  Filled 2017-01-04: qty 240, 30d supply, fill #11

## 2016-02-17 ENCOUNTER — Other Ambulatory Visit: Payer: Self-pay

## 2016-03-13 ENCOUNTER — Other Ambulatory Visit: Payer: Self-pay

## 2016-03-30 ENCOUNTER — Encounter: Payer: Self-pay | Admitting: Specialist

## 2016-03-30 LAB — OUTSIDE LABS
ALANINE TRANSFERASE (ALT): 18
ALBUMIN: 4.7
ALKALINE PHOSPHATASE (ALP): 105
ASPARTATE TRANSAMINASE (AST): 16
BILIRUBIN TOTAL: 0.6
CALCIUM: 10.2
CARBON DIOXIDE TOTAL: 24
CHLORIDE: 97
CREATINE KINASE: 45
CREATININE BLOOD: 0.82
CREATININE SPOT URINE: 85
GLUCOSE: 346
HEMATOCRIT: 48.2
HEMOGLOBIN: 16.2
LEUK. ESTERASE: NEGATIVE
MAGNESIUM (MG): 1.6
NEUTROPHIL ABS AUTO: 3572
NEUTROPHILS % AUTO: 68.7
NITRITE URINE: NEGATIVE
PHOSPHORUS (PO4): 3.2
PLATELET COUNT: 192
POTASSIUM: 4.2
PROTEIN TOTAL SPOT URINE: 14
PROTEIN URINE: NEGATIVE
PROTEIN: 7.1
SODIUM: 136
SPECIFIC GRAVITY: 1.039
TACROLIMUS (FK506): 5
UREA NITROGEN, BLOOD (BUN): 16
WHITE BLOOD CELL COUNT: 5.2

## 2016-04-09 ENCOUNTER — Other Ambulatory Visit: Payer: Self-pay | Admitting: Nephrology

## 2016-04-09 ENCOUNTER — Other Ambulatory Visit: Payer: Self-pay

## 2016-04-09 DIAGNOSIS — Z940 Kidney transplant status: Principal | ICD-10-CM

## 2016-04-09 MED ORDER — MYCOPHENOLATE MOFETIL 250 MG CAPSULE
750.0000 mg | ORAL_CAPSULE | Freq: Two times a day (BID) | ORAL | 11 refills | Status: DC
Start: 2016-04-09 — End: 2017-03-06
  Filled 2016-04-09: qty 180, 30d supply, fill #0
  Filled 2016-05-22: qty 180, 30d supply, fill #1
  Filled 2016-06-27: qty 180, 30d supply, fill #2
  Filled 2016-07-31: qty 180, 30d supply, fill #3
  Filled 2016-08-24: qty 180, 30d supply, fill #4
  Filled 2016-09-18: qty 180, 30d supply, fill #5
  Filled 2016-10-16: qty 180, 30d supply, fill #6
  Filled 2016-11-13: qty 180, 30d supply, fill #7
  Filled 2016-12-10: qty 180, 30d supply, fill #8
  Filled 2017-01-04: qty 180, 30d supply, fill #9
  Filled 2017-01-30: qty 180, 30d supply, fill #10
  Filled 2017-02-26: qty 180, 30d supply, fill #11

## 2016-05-07 ENCOUNTER — Encounter: Payer: Self-pay | Admitting: Nephrology

## 2016-05-22 ENCOUNTER — Other Ambulatory Visit: Payer: Self-pay

## 2016-06-27 ENCOUNTER — Other Ambulatory Visit: Payer: Self-pay

## 2016-07-31 ENCOUNTER — Other Ambulatory Visit: Payer: Self-pay

## 2016-08-24 ENCOUNTER — Other Ambulatory Visit: Payer: Self-pay

## 2016-09-18 ENCOUNTER — Other Ambulatory Visit: Payer: Self-pay

## 2016-10-16 ENCOUNTER — Other Ambulatory Visit: Payer: Self-pay

## 2016-11-13 ENCOUNTER — Other Ambulatory Visit: Payer: Self-pay

## 2016-12-10 ENCOUNTER — Other Ambulatory Visit: Payer: Self-pay

## 2017-01-04 ENCOUNTER — Other Ambulatory Visit: Payer: Self-pay

## 2017-01-09 ENCOUNTER — Other Ambulatory Visit: Payer: Self-pay | Admitting: Specialist

## 2017-01-09 ENCOUNTER — Other Ambulatory Visit: Payer: Self-pay

## 2017-01-09 DIAGNOSIS — Z940 Kidney transplant status: Principal | ICD-10-CM

## 2017-01-10 MED ORDER — TACROLIMUS 1 MG CAPSULE, IMMEDIATE-RELEASE
4.0000 mg | ORAL_CAPSULE | Freq: Two times a day (BID) | ORAL | 11 refills | Status: DC
Start: 2017-01-10 — End: 2018-01-23
  Filled 2017-01-10: fill #0
  Filled 2017-01-30: qty 240, 30d supply, fill #0
  Filled 2017-02-26: qty 240, 30d supply, fill #1
  Filled 2017-03-22: qty 240, 30d supply, fill #2
  Filled 2017-04-18: qty 240, 30d supply, fill #3
  Filled 2017-05-21: qty 240, 30d supply, fill #4
  Filled 2017-06-25: qty 240, 30d supply, fill #5
  Filled 2017-07-22: qty 240, 30d supply, fill #6
  Filled 2017-08-16: qty 240, 30d supply, fill #7
  Filled 2017-09-10: qty 240, 30d supply, fill #8
  Filled 2017-10-08 – 2017-11-26 (×2): qty 240, 30d supply, fill #9
  Filled 2017-12-17 – 2017-12-20 (×2): qty 240, 30d supply, fill #10

## 2017-01-14 ENCOUNTER — Encounter: Payer: Self-pay | Admitting: Nephrology

## 2017-01-21 ENCOUNTER — Telehealth: Payer: Self-pay

## 2017-01-21 NOTE — Telephone Encounter (Signed)
Patient called said he had labs drawn last week at [facility] laboratory, asked to speak to the nurse coordinator regarding the results if there were any concerns.    Sharlet Salina  [facility]  Transplant Clinic [phone]

## 2017-01-22 NOTE — Telephone Encounter (Signed)
Called pt; no answer; left vm instructions to please call [facility] at [phone]

## 2017-01-23 ENCOUNTER — Encounter: Payer: Self-pay | Admitting: Nephrology

## 2017-01-23 LAB — OUTSIDE LABS
ALANINE TRANSFERASE (ALT): 26
ALBUMIN: 3.8
ALKALINE PHOSPHATASE (ALP): 106
ASPARTATE TRANSAMINASE (AST): 16
BILIRUBIN TOTAL: 0.6
CALCIUM: 9.7
CARBON DIOXIDE TOTAL: 32
CHLORIDE: 100
CREATINE KINASE: 73
CREATININE BLOOD: 0.84
GLUCOSE: 219
HDL CHOLESTEROL: 49
HEMATOCRIT: 46.6
HEMOGLOBIN: 15.9
HGB A1C: 11
MAGNESIUM (MG): 1.4
NEUTROPHIL ABS AUTO: 4
NEUTROPHILS % AUTO: 67
PHOSPHORUS (PO4): 3.4
PLATELET COUNT: 212
POTASSIUM: 4
PROTEIN: 7.3
SODIUM: 138
TACROLIMUS (FK506): 5.7
TRIGLYCERIDE: 46
UREA NITROGEN, BLOOD (BUN): 12
VLDL: 9
WHITE BLOOD CELL COUNT: 5.9

## 2017-01-24 ENCOUNTER — Telehealth: Payer: Self-pay

## 2017-01-24 NOTE — Telephone Encounter (Signed)
Called pt; no answer; left the following vm: no concerns after review of lab results; please call Transplant Clinic at [phone] to schedule a clinic visit and provide contact info for local Nephrologist.

## 2017-01-24 NOTE — Telephone Encounter (Signed)
Patient called wanting to speak to the nurse coordinator regarding his most recent labs and wanting to know the A1C level. He also has an appointment with his PCP at [facility] on 01/29/17 and would like to know what cancer screening he needs to ask for specifically. Patient would also like to go over his current medication list, he said he doesn't have an up to date list. Requested a call back.    Sharlet Salina  [facility]  Transplant Clinic [phone]

## 2017-01-24 NOTE — Telephone Encounter (Signed)
Returned pt's phone call; verified 3 pt identifiers; pt verbalized understanding of the following:  Reviewed labs and medication list with pt.

## 2017-01-30 ENCOUNTER — Other Ambulatory Visit: Payer: Self-pay

## 2017-01-31 ENCOUNTER — Telehealth: Payer: Self-pay | Admitting: Specialist

## 2017-01-31 ENCOUNTER — Encounter: Payer: Self-pay | Admitting: Specialist

## 2017-01-31 NOTE — Telephone Encounter (Signed)
Patient calling to inform Kaylyn Lim will be sending copy of abnormal labs. Please call pt to discuss.

## 2017-02-05 ENCOUNTER — Encounter: Payer: Self-pay | Admitting: Internal Medicine

## 2017-02-05 ENCOUNTER — Encounter: Payer: Self-pay | Admitting: Specialist

## 2017-02-05 ENCOUNTER — Ambulatory Visit: Payer: Medicare Other | Attending: Specialist | Admitting: Specialist

## 2017-02-05 VITALS — BP 122/81 | HR 92 | Temp 98.8°F | Wt 152.8 lb

## 2017-02-05 DIAGNOSIS — I10 Essential (primary) hypertension: Secondary | ICD-10-CM | POA: Insufficient documentation

## 2017-02-05 DIAGNOSIS — D631 Anemia in chronic kidney disease: Secondary | ICD-10-CM

## 2017-02-05 DIAGNOSIS — Z4822 Encounter for aftercare following kidney transplant: Principal | ICD-10-CM | POA: Insufficient documentation

## 2017-02-05 DIAGNOSIS — Z794 Long term (current) use of insulin: Secondary | ICD-10-CM | POA: Insufficient documentation

## 2017-02-05 DIAGNOSIS — E119 Type 2 diabetes mellitus without complications: Secondary | ICD-10-CM | POA: Insufficient documentation

## 2017-02-05 DIAGNOSIS — N182 Chronic kidney disease, stage 2 (mild): Secondary | ICD-10-CM

## 2017-02-05 DIAGNOSIS — Z940 Kidney transplant status: Secondary | ICD-10-CM | POA: Insufficient documentation

## 2017-02-05 DIAGNOSIS — Z79899 Other long term (current) drug therapy: Secondary | ICD-10-CM | POA: Insufficient documentation

## 2017-02-05 DIAGNOSIS — Z5181 Encounter for therapeutic drug level monitoring: Secondary | ICD-10-CM | POA: Insufficient documentation

## 2017-02-05 LAB — OUTSIDE LABS
ALANINE TRANSFERASE (ALT): 28
ALBUMIN: 3.8
ALKALINE PHOSPHATASE (ALP): 102
ASPARTATE TRANSAMINASE (AST): 16
BILIRUBIN TOTAL: 0.6
CALCIUM: 8.7
CARBON DIOXIDE TOTAL: 28
CHLORIDE: 97
CREATINE KINASE: 73
CREATININE BLOOD: 0.75
GLUCOSE: 214
HEMATOCRIT: 47.4
HEMOGLOBIN: 15.7
LEUK. ESTERASE: NEGATIVE
MAGNESIUM (MG): 1.4
NEUTROPHIL ABS AUTO: 3.1
NEUTROPHILS % AUTO: 65
NITRITE URINE: NEGATIVE
OCCULT BLOOD URINE: NEGATIVE
PHOSPHORUS (PO4): 2.4
PLATELET COUNT: 164
POTASSIUM: 3.9
PROTEIN URINE: NEGATIVE
PROTEIN: 7.5
RBC/HPF: NONE SEEN
SODIUM: 134
SPECIFIC GRAVITY: 1
TACROLIMUS (FK506): 7.1
UREA NITROGEN, BLOOD (BUN): 15
WBC/HPF: NONE SEEN
WHITE BLOOD CELL COUNT: 4.8

## 2017-02-05 MED ORDER — METOPROLOL TARTRATE 25 MG TABLET
25.0000 mg | ORAL_TABLET | Freq: Two times a day (BID) | ORAL | 11 refills | Status: DC
Start: 2017-02-05 — End: 2017-02-05
  Filled 2017-02-05: qty 60, 30d supply, fill #0

## 2017-02-05 MED ORDER — BLOOD-GLUCOSE METER KIT
1.0000 | PACK | Freq: Once | 3 refills | Status: DC
Start: 2017-02-05 — End: 2017-02-05
  Filled 2017-02-05: qty 1, 30d supply, fill #0

## 2017-02-05 MED ORDER — METOPROLOL TARTRATE 25 MG TABLET
25.0000 mg | ORAL_TABLET | Freq: Two times a day (BID) | ORAL | 11 refills | Status: DC
Start: 2017-02-05 — End: 2019-04-27

## 2017-02-05 MED ORDER — BLOOD-GLUCOSE METER KIT
1.0000 | PACK | Freq: Once | 3 refills | Status: DC
Start: 2017-02-05 — End: 2022-11-26

## 2017-02-05 NOTE — Progress Notes (Signed)
OUTPATIENT FOLLOW-UP  02/05/2017    Past Medical History:   Diagnosis Date    Diabetes mellitus     Hypertension     Kidney disease        HISTORY: 74yrHispanic gentleman with a history of stage V chronic kidney disease in the setting of type II diabetes mellitusand hypertension who received a deceased-donor renal transplant (en-bloc kidneys from a 18-monthld, 10.8-kg donor with acute kidney injury - terminal creatinine 2.8 mg/dL) on 12/22/12.[name] nonsensitized. 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 His posttransplant course was remarkable forslow graft function, gross hematuria, hyperkalemia, and persistent nausea. Here today for post kidney biopsy follow up       Rejection Episodes:NO  BK Screening: < 500    12/2013  Pertinent Infections: No    CC: How is my kidney function     SUBJECTIVE:  Since his last visit he is doing well good UOP no urinary complaints no fever BP is elevated was started on metoprolol 25 mg daily     REVIEW OF SYSTEMS:  Constitutional: Generally feels well  Eyes: The patient denies changes in visual acuity, diplopia or amaurosis, no discharge, matting, redness, tearing or eye pain.  Ears, Nose, Mouth, Throat: negative for hearing problems, no complains of chronic sinus problems.  No mouth or dental compliants.   CV: No complaints of palpitations, syncope, chest pain, paroxysmal nocturnal dyspnea, orthopnea, cyanosis, or claudication.  Resp: No complaints of cough, hemoptysis, shortness of breath, pleuritic pain, wheezing, dyspnea on exertion  GI: No complaints of vomiting, dysphagia, nausea, heartburn or reflux, early satiety, abdominal pain, bleeding from rectum, constipation, diarrhea.  GU:  No complaints of dysuria, or hematuria.  Musculoskeletal: No complaints of joint or muscle pain, swelling, or tenderness .  Integumentary: Negative for moles that have changed, rash, itching, bruising, lumps or bumps.  Neuro: No complaints of headaches,  seizures,  Psych: No complaints of depressed mood, insomnia, or anxiety.  Endo:No complaints of cold intolerance, heat intolerance.  Heme/Lymphatic:No complaints of bleeding disorder, abnormal bleeding, abnormal bruising.  Allergy/Immun: No complaints of hay fever, itchy eyes.    MEDICATIONS:  Outpatient Prescriptions Marked as Taking for the 02/05/17 encounter (Office Visit) with MuConstance HawMD   Medication Sig Dispense Refill    Blood Glucose Meter (ACCU-CHEK AVIVA PLUS) Kit 1 kit one time. 1 kit 0    Duloxetine (CYMBALTA) 30 mg Delayed Release Capsule Take 1 capsule by mouth every day. 30 capsule 2    FamoTIDine (PEPCID) 20 mg Tablet Tablet Take 1 tablet by mouth every morning. 30 tablet 6    ferrous sulfate 325 mg (65 mg iron) Tablet Take 1 tablet by mouth 2 times daily. 60 tablet 11    Insulin Glargine (LANTUS) 100 unit/mL Vial Inject 10 units subcutaneously daily 10 mL 6    insulin syr/ndl U100 half mark (BD INSULIN SYRINGE HALF UNIT MARKINGS) 0.3 mL 31 gauge x 5/16" Syringe use 1 syringe to inject lantus daily at bedtime 100 syringe 3    Insulin Syringe-Needle U-100 0.3 mL 31 gauge x 5/16 Use Daily with Lantus. 100 each 3    Mycophenolate (MMF) 250 mg Capsule Take 3 capsules by mouth 2 times daily. Or as directed. Do NOT take calcium or antacids within 2 hours of any dose. (SANDOZ ONLY; DO NOT CHANGE MFG FOR REFILLS) +++ ICD 10: Z94.0 +++ 180 capsule 11    Pravastatin (PRAVACHOL) 20 mg Tablet Take 1 tablet by  mouth every day. 30 tablet 5    Tacrolimus (PROGRAF) 1 mg Capsule Take 4 capsules by mouth 2 times daily. Or as directed by MD. Take doses every 12 hours. Take morning dose AFTER blood draw on lab days. (SANDOZ ONLY; DO NOT CHANGE MFG FOR REFILLS) +++ ICD 10: Z 94.0 +++ 240 capsule 11    Valsartan (DIOVAN) 40 mg Tablet Take 1 tablet by mouth every day. 30 tablet 3         FAMILY HISTORY:  No family history on file.    SOCIAL HISTORY:  Social History     Social History    Marital  status: MARRIED     Spouse name: N/A    Number of children: N/A    Years of education: N/A     Occupational History    Not on file.     Social History Main Topics    Smoking status: Never Smoker    Smokeless tobacco: Never Used    Alcohol use No    Drug use: No    Sexual activity: Not on file     Other Topics Concern    Not on file     Social History Narrative       Reviewed and noncontributory.    PHYSICAL EXAMINATION:  BP 122/81  Pulse 92  Temp 37.1 C (98.8 F) (Temporal)  Wt 69.3 kg (152 lb 12.5 oz)  SpO2 99%  BMI 27.06 kg/m2  Wt Readings from Last 4 Encounters:   02/05/17 69.3 kg (152 lb 12.5 oz)   01/13/14 73.3 kg (161 lb 9.6 oz)   12/31/13 70.3 kg (155 lb)   12/01/13 71.5 kg (157 lb 10.1 oz)         Physical Exam:General Appearance: not pale or jaundiced   Eyes: WNL  ENT: WNL  Neck: :supple. No adenopathy, thyroid symmetric, normal size   Cardiovascular: normal rate and regular rhythm, no murmurs, clicks, or gallops.   Respiratory: clear to auscultation, no crackles or wheezes   Abdomen: soft, non-tender. No masses or organomegaly.   GU: The renal allograft With no tenderness no bruits   Extremities: No edema  Musculoskeletal: No arthritis   Skin: No rashes or lesions.  Neuro:No tremors     LABORATORY DATA:  SODIUM   Date Value Ref Range Status   02/04/2017 134  Final   01/14/2017 138  Final   03/28/2016 136  Final     POTASSIUM   Date Value Ref Range Status   02/04/2017 3.9  Final   01/14/2017 4.0  Final   03/28/2016 4.2  Final     CHLORIDE   Date Value Ref Range Status   02/04/2017 97  Final   01/14/2017 100  Final   03/28/2016 97  Final     CARBON DIOXIDE TOTAL   Date Value Ref Range Status   02/04/2017 28  Final   01/14/2017 32  Final   03/28/2016 24  Final     UREA NITROGEN, BLOOD (BUN)   Date Value Ref Range Status   02/04/2017 15  Final   01/14/2017 12  Final   03/28/2016 16  Final     CREATININE BLOOD   Date Value Ref Range Status   02/04/2017 0.75  Final   01/14/2017  0.84  Final   03/28/2016 0.82  Final     CALCIUM   Date Value Ref Range Status   02/04/2017 8.7  Final   01/14/2017 9.7  Final  03/28/2016 10.2  Final     GLUCOSE   Date Value Ref Range Status   02/04/2017 214  Final   01/14/2017 219  Final   03/28/2016 346  Final   08/29/2015 276  Final   07/12/2015 231  Final   04/12/2014 267  Final     No components found for: PHOSPHORUS  No components found for: MAGNESIUM  ASPARTATE TRANSAMINASE (AST)   Date Value Ref Range Status   02/04/2017 16  Final   01/14/2017 16  Final   03/28/2016 16  Final       TRIGLYCERIDE   Date Value Ref Range Status   01/14/2017 46  Final     HDL CHOLESTEROL   Date Value Ref Range Status   01/14/2017 49  Final     TACROLIMUS (FK506)   Date Value Ref Range Status   02/04/2017 7.1  Final   01/14/2017 5.7  Final   03/28/2016 5.0  Final   08/29/2015 7.7  Corrected       WHITE BLOOD CELL COUNT   Date Value Ref Range Status   02/04/2017 4.8  Final   01/14/2017 5.9  Final   03/28/2016 5.2  Final     HEMOGLOBIN   Date Value Ref Range Status   02/04/2017 15.7  Final   01/14/2017 15.9  Final   03/28/2016 16.2  Final     HEMATOCRIT   Date Value Ref Range Status   02/04/2017 47.4  Final   01/14/2017 46.6  Final   03/28/2016 48.2  Final     PLATELET COUNT   Date Value Ref Range Status   02/04/2017 164  Final   01/14/2017 212  Final   03/28/2016 192  Final         IMPRESSION AND PLAN:  1.  S/P Renal Transplant:  Renal function is stable no proteinuria     2.  Immunosuppression.  Current immunsuppression is prograf and cellcept     3.  Hypertension.  High increase metoprolol to 25 mg PO BID     4.  Hematologic.  Cell counts are stable.    5.  Electrolytes are within acceptable limits.    6. DM: on insulin check HbA1C followed by his PCP needs better control     The patient was counseled about the above findings and recommendations and understood the discussion. Charles Galvan will return to clinic in 6 months   and will have laboratory tests every 2 months      [facility]  Transplant Nephrology   [facility]  [place]  Pager [phone]

## 2017-02-05 NOTE — Nursing Note (Signed)
Patient roomed, vital signs taken, drug allergies verified, screened for pain, tobacco history verified.     Fujie Dickison O'Donnell, MA

## 2017-02-07 ENCOUNTER — Telehealth: Payer: Self-pay

## 2017-02-07 NOTE — Telephone Encounter (Signed)
Called pt; no answer; left vm instructions to please contact PCP for refill on diabetic testing supplies

## 2017-02-07 NOTE — Telephone Encounter (Signed)
-----   Message from Fleeta Emmer sent at 02/07/2017 10:03 AM PDT -----  Regarding: After-Hours Voice Message: Patient calling re: blood glucose meter & prescription changes  Msg date/time:  02/06/17,5:05 pm msg retrieved:  02/07/2017    Patient Name: Charles, Galvan  MRN # : [id number]  Status/Referral Source: Currently Followed / 28  DOB: 1966/02/11    Caller:  Colette Ribas  Call back #: [phone]    Message text:  Was there yesterday (4/17). Message is for [facility] regarding test meter for diabetes test strips and lancets. He believes name brand of meter is ACCU-CHEK AVIVA and it's maybe the pharmacy in [facility]. Left number yesterday (4/17) with Minus Liberty (he thinks) at pharmacy next door who was going to relay message back to kidney transplant dept. Not sure to whom she gave it. Thinks that's all he needs--meter, test strips and lancets for meter.  Also, while here, doctor  increased Metoprolol to twice a day (once in morning; once in evening)-please verify with pharmacist tomorrow when they call, that way they can call insurance and get it approve. If questions,  please call.

## 2017-02-25 ENCOUNTER — Ambulatory Visit: Payer: Medicare Other

## 2017-02-26 ENCOUNTER — Other Ambulatory Visit: Payer: Self-pay

## 2017-03-06 ENCOUNTER — Other Ambulatory Visit: Payer: Self-pay | Admitting: Nephrology

## 2017-03-06 ENCOUNTER — Other Ambulatory Visit: Payer: Self-pay

## 2017-03-06 DIAGNOSIS — Z940 Kidney transplant status: Principal | ICD-10-CM

## 2017-03-06 MED ORDER — MYCOPHENOLATE MOFETIL 250 MG CAPSULE
750.0000 mg | ORAL_CAPSULE | Freq: Two times a day (BID) | ORAL | 11 refills | Status: DC
Start: 2017-03-06 — End: 2018-01-23
  Filled 2017-03-06: fill #0
  Filled 2017-03-22: qty 180, 30d supply, fill #0
  Filled 2017-04-18: qty 180, 30d supply, fill #1
  Filled 2017-05-21: qty 180, 30d supply, fill #2
  Filled 2017-06-25: qty 180, 30d supply, fill #3
  Filled 2017-07-22: qty 180, 30d supply, fill #4
  Filled 2017-08-16: qty 180, 30d supply, fill #5
  Filled 2017-09-10: qty 180, 30d supply, fill #6
  Filled 2017-10-08 – 2017-11-26 (×2): qty 180, 30d supply, fill #7
  Filled 2017-12-17 – 2017-12-20 (×2): qty 180, 30d supply, fill #8
  Filled 2018-01-15: qty 180, 30d supply, fill #9

## 2017-03-22 ENCOUNTER — Other Ambulatory Visit: Payer: Self-pay

## 2017-04-01 ENCOUNTER — Telehealth: Payer: Self-pay

## 2017-04-01 NOTE — Telephone Encounter (Signed)
Patient called said his Orthopedic Surgeon Dr. Nicki Guadalajararacy Bigelow from [facility] would need a clearance from the Transplant team here for the Bilateral Carpal tunnel surgery that he is planning to have in about 6 months from now. Patient asked for the clearance to be mailed to his alternate Address:[address] [place] [id number].    Sharlet Salinaatyana Zanyla Klebba  [facility]  Transplant Clinic [phone]

## 2017-04-02 NOTE — Telephone Encounter (Signed)
Called pt; verified 3 pt identifiers; pt verbalized understanding of the following:  Transplant team has no objection to surgery but please obtain health clearance from PCP

## 2017-04-05 ENCOUNTER — Telehealth: Payer: Self-pay

## 2017-04-05 ENCOUNTER — Telehealth: Payer: Self-pay | Admitting: Internal Medicine

## 2017-04-05 NOTE — Telephone Encounter (Signed)
Patient wasn't

## 2017-04-05 NOTE — Progress Notes (Signed)
Patient calling back regarding previous phone call he was on with clinic. Patient was VERY upset and said he would like to speak with someone "who is head in charge" as I asked patient what his concern was he got more agitated. Was insulting [facility] and speaking over me. Asked patient multiple times to calm down but patient wouldn't respond to my questions or let me speak. Offered to send message to Clinic Supervisor and Facilities managerurse manager but patient just repeated he wanted to speak with whomever he was just on the line with. While I was looking in patient's chart there was no documentation of any recent interactions.

## 2017-04-05 NOTE — Telephone Encounter (Signed)
Please see earlier encounters from today regarding patient request, MD response and patients behavior.    I have contacted Dr.Tracy Bigelows office to try to determine if they are requesting clearance from transplant in addition to a general health clearance for surgery which per Dr.Huang, is to be provided by PCP. I have provided my mobile number as a point of contact.    I have also spoken to Mr.Cantu. I let him know that I spoke to Dr.Bigelows office and requested that I be contacted to determine what they would need from the transplant team. I did tell patient that from a transplant perspective there was not a problem with the surgery, but that was not to be confused with an overall health clearance.    During our conversation he was calm and polite. I informed I would follow up once I had heard back from Dr.Bigelows office- [phone]

## 2017-04-05 NOTE — Telephone Encounter (Signed)
Incoming call from pt; verified 3 pt identifiers; pt raised voice and became belligerent in response to the following instructions: your PCP can clear you for hand surgery.

## 2017-04-05 NOTE — Telephone Encounter (Signed)
Patient called said [facility] and he said that he will need something in writing sent to him regarding the clearance from us for his upcoming surgery. He also mentioned that he will be getting his labs drawn this upcoming Monday for us.     Sharlet Salinaatyana Camary Sosa  [facility]  Transplant Clinic [phone]

## 2017-04-05 NOTE — Telephone Encounter (Signed)
Patient would like a call back regarding needing a clearance for surgery from transplant standpoint- He refuses to deal with French Anaracy anymore - Please call back.     [facility]  [facility], [facility]  [facility]  PHONE [phone]  FAX [phone]

## 2017-04-11 NOTE — Telephone Encounter (Signed)
No word from Dr.Bigelows office as of yet, although when calling just now I did reach [facility] and I had a cal from their office earlier although no message was left.  Reception at [facility] confirms that someone did try to reach me with request for 'medical clearance' for Mr.Cantu. I have repeated previous message that per Dr.Huang, transplant team would provide clearance from a kidney transplant perspective but a full medical clearance would need to come from a PCP.    I have again provided my mobile number as well as clinics contact information including post trx fax #.      Dr.Bigelow's fax # is [phone] attention Analicia

## 2017-04-11 NOTE — Telephone Encounter (Signed)
Call returned from Analicia and she clarified that the clearance is regarding transplant only and that routine medical clearance was already provided by Mr.Cantu's PCP. She will be faxing a letter for co-sign.

## 2017-04-12 NOTE — Telephone Encounter (Signed)
Letter signed by Dr.Huang and faxed to Dr.Bigelow's office.

## 2017-04-18 ENCOUNTER — Other Ambulatory Visit: Payer: Self-pay

## 2017-05-21 ENCOUNTER — Other Ambulatory Visit: Payer: Self-pay

## 2017-06-25 ENCOUNTER — Other Ambulatory Visit: Payer: Self-pay

## 2017-07-19 ENCOUNTER — Other Ambulatory Visit: Payer: Self-pay

## 2017-07-22 ENCOUNTER — Other Ambulatory Visit: Payer: Self-pay

## 2017-08-15 ENCOUNTER — Other Ambulatory Visit: Payer: Self-pay

## 2017-08-16 ENCOUNTER — Other Ambulatory Visit: Payer: Self-pay

## 2017-08-27 ENCOUNTER — Telehealth: Payer: Self-pay

## 2017-08-27 NOTE — Telephone Encounter (Signed)
Called pt; no answer; left vm instructions to call [facility] at [phone]      Orvis BrillHuang, Yihung, MD  Claire Shownooley, Rosa Wyly, RN            He can use edibles but smoking marijuana would carry the risk of fungal pneumonia.     Eric    Previous Messages      ----- Message -----   From: Claire Shownooley, Tionna Gigante, RN   Sent: 08/27/2017 12:10 PM   To: Orvis BrillYihung Huang, MD, Claire Shownracy Shaquna Geigle, RN   Subject: cannabis                       Patient's pain management doctor recommended the use of cannabis to manage back pain. Patient wants to know if using cannabis is safe for kidney.

## 2017-08-27 NOTE — Telephone Encounter (Signed)
Patient's pain management doctor recommended the use of cannabis to manage back pain. Patient wants to know if using cannabis is safe for kidney.

## 2017-08-28 ENCOUNTER — Telehealth: Payer: Self-pay

## 2017-08-28 NOTE — Telephone Encounter (Signed)
Called pt; no answer; left vm instructions to please call [facility] regarding back pain management.

## 2017-08-28 NOTE — Telephone Encounter (Signed)
Called pt; no answer; left the following instructions on pt's vm:      Orvis BrillHuang, Yihung, MD  Claire Shownooley, Lonnetta Kniskern, RN            He can use edibles but smoking marijuana would carry the risk of fungal pneumonia.     Eric    Previous Messages      ----- Message -----   From: Claire Shownooley, Adryel Wortmann, RN   Sent: 08/27/2017 12:10 PM   To: Orvis BrillYihung Huang, MD, Claire Shownracy Duan Forman, RN   Subject: cannabis                       Patient's pain management doctor recommended the use of cannabis to manage back pain. Patient wants to know if using cannabis is safe for kidney.

## 2017-08-29 NOTE — Telephone Encounter (Signed)
Patient calling to confirm he received your message and said thank you very much.

## 2017-09-06 ENCOUNTER — Other Ambulatory Visit: Payer: Self-pay

## 2017-09-09 ENCOUNTER — Other Ambulatory Visit: Payer: Self-pay

## 2017-09-10 ENCOUNTER — Other Ambulatory Visit: Payer: Self-pay

## 2017-10-04 ENCOUNTER — Other Ambulatory Visit: Payer: Self-pay

## 2017-10-07 ENCOUNTER — Other Ambulatory Visit: Payer: Self-pay

## 2017-10-08 ENCOUNTER — Other Ambulatory Visit: Payer: Self-pay

## 2017-10-09 ENCOUNTER — Other Ambulatory Visit: Payer: Self-pay

## 2017-10-23 ENCOUNTER — Other Ambulatory Visit: Payer: Self-pay

## 2017-10-28 ENCOUNTER — Other Ambulatory Visit: Payer: Self-pay

## 2017-11-19 ENCOUNTER — Other Ambulatory Visit: Payer: Self-pay

## 2017-11-20 ENCOUNTER — Other Ambulatory Visit: Payer: Self-pay

## 2017-11-22 ENCOUNTER — Other Ambulatory Visit: Payer: Self-pay

## 2017-11-25 ENCOUNTER — Other Ambulatory Visit: Payer: Self-pay

## 2017-11-26 ENCOUNTER — Other Ambulatory Visit: Payer: Self-pay

## 2017-12-17 ENCOUNTER — Other Ambulatory Visit: Payer: Self-pay

## 2017-12-19 ENCOUNTER — Other Ambulatory Visit: Payer: Self-pay

## 2017-12-20 ENCOUNTER — Other Ambulatory Visit: Payer: Self-pay

## 2018-01-13 ENCOUNTER — Other Ambulatory Visit: Payer: Self-pay

## 2018-01-14 ENCOUNTER — Other Ambulatory Visit: Payer: Self-pay | Admitting: Specialist

## 2018-01-14 ENCOUNTER — Other Ambulatory Visit: Payer: Self-pay

## 2018-01-14 DIAGNOSIS — Z940 Kidney transplant status: Principal | ICD-10-CM

## 2018-01-15 ENCOUNTER — Other Ambulatory Visit: Payer: Self-pay

## 2018-01-15 NOTE — Telephone Encounter (Signed)
[place]

## 2018-01-20 ENCOUNTER — Other Ambulatory Visit: Payer: Self-pay

## 2018-01-21 ENCOUNTER — Telehealth: Payer: Self-pay

## 2018-01-21 NOTE — Telephone Encounter (Signed)
Patient is calling to update his lab location. Message has been sent to [facility]. Patient also wanted to inform his RN that he has had two different surgeries and has his follow up appointment this May.

## 2018-01-21 NOTE — Telephone Encounter (Signed)
Called pt; no answer; left the following vm instructions: please call [facility] at [phone]

## 2018-01-23 ENCOUNTER — Other Ambulatory Visit: Payer: Self-pay

## 2018-01-23 ENCOUNTER — Telehealth: Payer: Self-pay | Admitting: Internal Medicine

## 2018-01-23 ENCOUNTER — Telehealth: Payer: Self-pay

## 2018-01-23 DIAGNOSIS — Z940 Kidney transplant status: Principal | ICD-10-CM

## 2018-01-23 MED ORDER — TACROLIMUS 1 MG CAPSULE, IMMEDIATE-RELEASE
ORAL_CAPSULE | ORAL | 11 refills | Status: DC
Start: 2018-01-23 — End: 2019-01-20
  Filled 2018-01-23: fill #0
  Filled 2018-01-29: qty 240, 30d supply, fill #0
  Filled 2018-02-28: qty 240, 30d supply, fill #1
  Filled 2018-03-25 – 2018-03-27 (×2): qty 240, 30d supply, fill #2
  Filled 2018-04-23: qty 240, 30d supply, fill #3
  Filled 2018-05-20: qty 240, 30d supply, fill #4
  Filled 2018-07-11: qty 240, 30d supply, fill #5
  Filled 2018-08-06: qty 240, 30d supply, fill #6
  Filled 2018-09-05: qty 240, 30d supply, fill #7
  Filled 2018-09-30: qty 240, 30d supply, fill #8
  Filled 2018-11-04: qty 240, 30d supply, fill #9
  Filled 2018-12-01: qty 240, 30d supply, fill #10
  Filled 2018-12-31: qty 240, 30d supply, fill #11

## 2018-01-23 MED ORDER — MYCOPHENOLATE MOFETIL 250 MG CAPSULE
750.0000 mg | ORAL_CAPSULE | Freq: Two times a day (BID) | ORAL | 11 refills | Status: DC
Start: 2018-01-23 — End: 2019-01-20
  Filled 2018-01-23: fill #0
  Filled 2018-01-29: qty 180, 30d supply, fill #0
  Filled 2018-02-28: qty 180, 30d supply, fill #1
  Filled 2018-03-25 – 2018-03-27 (×2): qty 180, 30d supply, fill #2
  Filled 2018-04-23: qty 180, 30d supply, fill #3
  Filled 2018-05-20: qty 180, 30d supply, fill #4
  Filled 2018-07-11: qty 180, 30d supply, fill #5
  Filled 2018-08-06: qty 180, 30d supply, fill #6
  Filled 2018-09-05: qty 180, 30d supply, fill #7
  Filled 2018-09-30: qty 180, 30d supply, fill #8
  Filled 2018-11-04: qty 180, 30d supply, fill #9
  Filled 2018-12-01: qty 180, 30d supply, fill #10
  Filled 2018-12-31: qty 180, 30d supply, fill #11

## 2018-01-23 NOTE — Telephone Encounter (Signed)
Called pt; verified 3 pt identifiers; questions answered. Pt states he does not have local nephrologist or local PCP; will refill meds. Pt states

## 2018-01-23 NOTE — Telephone Encounter (Signed)
Patient returning call.  Please try again .  °

## 2018-01-23 NOTE — Telephone Encounter (Signed)
Pt asking for refills of IS; verified 3 pt identifiers; pt states he does not have a local nephrologist or PCP; pt verbalized understanding of the following:  Establish with local PCP and ask for referral to local nephrologist and call transplant center with contact information for both.

## 2018-01-27 ENCOUNTER — Other Ambulatory Visit: Payer: Self-pay

## 2018-01-28 ENCOUNTER — Other Ambulatory Visit: Payer: Self-pay

## 2018-01-29 ENCOUNTER — Other Ambulatory Visit: Payer: Self-pay

## 2018-02-25 ENCOUNTER — Other Ambulatory Visit: Payer: Self-pay

## 2018-02-27 ENCOUNTER — Other Ambulatory Visit: Payer: Self-pay

## 2018-02-28 ENCOUNTER — Other Ambulatory Visit: Payer: Self-pay

## 2018-03-06 ENCOUNTER — Ambulatory Visit: Payer: Medicare Other

## 2018-03-25 ENCOUNTER — Other Ambulatory Visit: Payer: Self-pay

## 2018-03-27 ENCOUNTER — Other Ambulatory Visit: Payer: Self-pay

## 2018-04-21 ENCOUNTER — Other Ambulatory Visit: Payer: Self-pay

## 2018-04-22 ENCOUNTER — Other Ambulatory Visit: Payer: Self-pay

## 2018-04-23 ENCOUNTER — Other Ambulatory Visit: Payer: Self-pay

## 2018-05-06 ENCOUNTER — Other Ambulatory Visit: Payer: Self-pay

## 2018-05-09 ENCOUNTER — Other Ambulatory Visit: Payer: Self-pay

## 2018-05-19 ENCOUNTER — Other Ambulatory Visit: Payer: Self-pay

## 2018-05-20 ENCOUNTER — Other Ambulatory Visit: Payer: Self-pay

## 2018-05-28 ENCOUNTER — Other Ambulatory Visit: Payer: Self-pay

## 2018-05-29 ENCOUNTER — Ambulatory Visit: Payer: Medicare Other

## 2018-06-02 ENCOUNTER — Other Ambulatory Visit: Payer: Self-pay

## 2018-06-05 ENCOUNTER — Encounter: Payer: Self-pay | Admitting: Internal Medicine

## 2018-06-05 LAB — OUTSIDE LABS
ALANINE TRANSFERASE (ALT): 18
ALBUMIN: 3.5
ALKALINE PHOSPHATASE (ALP): 138
ASPARTATE TRANSAMINASE (AST): 10
BILIRUBIN TOTAL: 0.3
CALCIUM: 9
CARBON DIOXIDE TOTAL: 30
CHLORIDE: 103
CREATININE BLOOD: 0.93
GLUCOSE: 388
HEMATOCRIT: 44.6 %
HEMOGLOBIN: 14.6 g/dL
MAGNESIUM (MG): 1.7
NEUTROPHIL ABS AUTO: 2.9 10*3/uL
NEUTROPHILS % AUTO: 68 %
PHOSPHORUS (PO4): 3
PLATELET COUNT: 172 10*3/uL
POTASSIUM: 4.2
PROTEIN: 7.3
SODIUM: 142
TACROLIMUS (FK506): 4.5
UREA NITROGEN, BLOOD (BUN): 12
WHITE BLOOD CELL COUNT: 4.1 10*3/uL

## 2018-06-10 ENCOUNTER — Ambulatory Visit: Payer: Medicare Other | Attending: Internal Medicine | Admitting: Internal Medicine

## 2018-06-10 VITALS — BP 157/105 | HR 98 | Temp 98.1°F | Ht 63.0 in | Wt 136.5 lb

## 2018-06-10 DIAGNOSIS — I10 Essential (primary) hypertension: Secondary | ICD-10-CM | POA: Insufficient documentation

## 2018-06-10 DIAGNOSIS — Z940 Kidney transplant status: Secondary | ICD-10-CM | POA: Insufficient documentation

## 2018-06-10 DIAGNOSIS — Z4822 Encounter for aftercare following kidney transplant: Principal | ICD-10-CM | POA: Insufficient documentation

## 2018-06-10 DIAGNOSIS — E118 Type 2 diabetes mellitus with unspecified complications: Secondary | ICD-10-CM

## 2018-06-10 DIAGNOSIS — E1169 Type 2 diabetes mellitus with other specified complication: Secondary | ICD-10-CM | POA: Insufficient documentation

## 2018-06-10 DIAGNOSIS — Z794 Long term (current) use of insulin: Secondary | ICD-10-CM

## 2018-06-10 DIAGNOSIS — Z79899 Other long term (current) drug therapy: Secondary | ICD-10-CM | POA: Insufficient documentation

## 2018-06-10 DIAGNOSIS — Z5181 Encounter for therapeutic drug level monitoring: Secondary | ICD-10-CM | POA: Insufficient documentation

## 2018-06-10 NOTE — Patient Instructions (Signed)
-   No change on the anti-rejection medications    - continue mycophenolate 750 mg twice a day   - continue tacrolimus 4 mg twice a day   - continue routine labs every 2 months  - return to clinic in one year

## 2018-06-10 NOTE — Nursing Note (Signed)
Pt identified by 2 identifiers, name and date of birth. Vital signs taken, pain level and allergies verified.     Charles Burd MA

## 2018-06-12 ENCOUNTER — Other Ambulatory Visit: Payer: Self-pay

## 2018-06-29 NOTE — Progress Notes (Signed)
TRANSPLANT NEPHROLOGY OUTPATIENT PROGRESS NOTE    Patient name: Charles Galvan  MRN: [id number]  DATE OF SERVICE: 06/10/2018    CC: Management of transplanted kidney and immunosuppression     ESRD and TRANSPLANT HISTORY  Charles Galvan is a 72yrgentleman with a history of stage V chronic kidney disease due to type II diabetes mellitusand hypertension who received a deceased-donor renal transplant (en-bloc kidneys from a 10.8-kg donor) on03/03/14.He was nonsensitized with intermediate risk for CMV infection.      Posttransplant course was remarkable forslow graft function but he eventually achieved excellent allograft function with Cr around 0.8 mg/dL.  1 year surveillance biopsy in March 2015 showed no evidence of rejection.      His follow up at the transplant clinic has been sporadic in recent years.  He was last seen in April 2018.  Prior to that, last visit was in 2015.      INTERVAL HISTORY:  Since last seen in clinic in April 2018   - we asked him to establish care with PCP to manage his diabetes.  He now follows with Dr. ABo Mcclintockas PCP and Dr. MGaylyn Cheersof endocrinology for his DM  - Glycemic control is still poor, but he has been working with his physicians to improve   - He had an episode of UTI in 2018.  He reports that he has been experiencing mild dysuria for the past few months   - he also reports having loose stools after eating for the past few weeks   - BP high in clinic today.  He reports that he was rushing to clinic and just had an energy drink   -     REVIEW OF SYSTEMS:  Constitutional:    No fevers, chills, malaise, anorexia or excessive fatigue.    Ears/Nose/Mouth/Throat:  Denies mouth sores, dysphagia, odynophagia   Pulmonary:     No complaints of shortness of breath or cough.    Cardiovascular:   No complaints of palpitations, syncope, exertional chest pain.   Gastrointestinal:  No abdominal pain, nausea, vomiting, or diarrhea.    Genitourinary:    No complaints of urinary retention,  dysuria, or hematuria  Integumentary:  No complaints of new rash, lumps, or moles   Musculoskeletal:    No complaints of joint pain, joint swelling, or muscle ache  Neuro:     No complaints of headaches, seizures.    Psych:     No complaints of depressed mood, insomnia, or anxiety.      PAST MEDICAL/SURGICAL HISTORY  Past Medical History:   Diagnosis Date    Diabetes mellitus     Hypertension     Kidney disease        SOCIAL HISTORY and FAMILY HISTORY   Reviewed and noncontributory    Allergies  No Known Allergies     Medications  Current Outpatient Medications   Medication Sig Dispense Refill    Blood Glucose Meter (ACCU-CHEK AVIVA PLUS) Kit 1 kit one time. 1 kit 3    Duloxetine (CYMBALTA) 30 mg Delayed Release Capsule Take 1 capsule by mouth every day. 30 capsule 2    FamoTIDine (PEPCID) 20 mg Tablet Tablet Take 1 tablet by mouth every morning. 30 tablet 6    ferrous sulfate 325 mg (65 mg iron) Tablet Take 1 tablet by mouth 2 times daily. 60 tablet 11    Insulin Glargine (LANTUS) 100 unit/mL Vial Inject 10 units subcutaneously daily 10 mL 6  insulin syr/ndl U100 half mark (BD INSULIN SYRINGE HALF UNIT MARKINGS) 0.3 mL 31 gauge x 5/16" Syringe use 1 syringe to inject lantus daily at bedtime 100 syringe 3    Insulin Syringe-Needle U-100 0.3 mL 31 gauge x 5/16 Use Daily with Lantus. 100 each 3    Metoprolol Tartrate (LOPRESSOR) 25 mg Tablet Take 1 tablet by mouth 2 times daily. 60 tablet 11    Mycophenolate (MMF) 250 mg Capsule Take 3 capsules by mouth 2 times daily. Or as directed. Do NOT take calcium or antacids within 2 hours of any dose. +++ ICD 10: Z94.0 +++ 180 capsule 11    Pravastatin (PRAVACHOL) 20 mg Tablet Take 1 tablet by mouth every day. 30 tablet 5    Tacrolimus (PROGRAF) 1 mg Capsule Take 4 capsules by mouth twice a day; Or as directed by MD. Take doses every 12 hours. Take morning dose AFTER blood draw on lab days.  +++ ICD 10: Z 94.0 +++ 240 capsule 11     No current  facility-administered medications for this visit.        PHYSICAL EXAM:  VITAL SIGNS:    BP (!) 157/105 (SITE: right arm, Orthostatic Position: sitting, Cuff Size: regular)   Pulse 98   Temp 36.7 C (98.1 F) (Temporal)   Ht 1.6 m ('5\' 3"'$ )   Wt 61.9 kg (136 lb 7.4 oz)   SpO2 100%   BMI 24.17 kg/m   Wt Readings from Last 5 Encounters:   06/10/18 61.9 kg (136 lb 7.4 oz)   02/05/17 69.3 kg (152 lb 12.5 oz)   01/13/14 73.3 kg (161 lb 9.6 oz)   12/31/13 70.3 kg (155 lb)   12/01/13 71.5 kg (157 lb 10.1 oz)       CONSTITUION:  Appears stated age.  No acute distress   EYES:    No scleral icterus.  No conjunctivitis.    ENT:     Clear oropharynx. No oral thrush or ulcers.   CV:    RRR, no murmur. No peripheral edema.  RESPIRATORY:   CTAB; no crackles     GI:     Abdomen soft, non-tender, bowel sounds normal.   GU:   Positive peritransplant bruit.  No kidney allograft tenderness.      SKIN:    Surgical incision clean & dry.  No rash.  NEURO:   Alert and oriented times three.  No tremor.  PSYCH:  Appropriate affect.  Though content normal.      LAB TESTS/STUDIES:  Lab Results   Lab Name Value Date/Time    CR 0.93 06/04/2018 12:42 PM    CR 0.75 02/04/2017 03:24 PM    CR 0.84 01/14/2017 03:37 PM     Lab Results   Lab Name Value Date/Time    TACROLIMUS 4.5 06/04/2018 12:42 PM    TACROLIMUS 7.1 02/04/2017 03:24 PM    TACROLIMUS 5.7 01/14/2017 03:37 PM     Lab Results   Lab Name Value Date/Time    NA 142 06/04/2018 12:42 PM    K 4.2 06/04/2018 12:42 PM    CL 103 06/04/2018 12:42 PM    CO2 30 06/04/2018 12:42 PM    BUN 12 06/04/2018 12:42 PM    CR 0.93 06/04/2018 12:42 PM    GLU 388 06/04/2018 12:42 PM     Lab Results   Lab Name Value Date/Time    AST 10 06/04/2018 12:42 PM    ALT 18 06/04/2018 12:42 PM  ALP 138 06/04/2018 12:42 PM    ALB 3.5 06/04/2018 12:42 PM    TP 7.3 06/04/2018 12:42 PM    TBIL 0.3 06/04/2018 12:42 PM     Lab Results   Lab Name Value Date/Time    WBC 4.1 06/04/2018 12:42 PM    HGB 14.6 06/04/2018 12:42  PM    HCT 44.6 06/04/2018 12:42 PM    PLT 172 06/04/2018 12:42 PM       Lab Results   Lab Name Value Date/Time    HGBA1C 11.0 01/14/2017 03:37 PM     Lab Results   Lab Name Value Date/Time    HDL 49 01/14/2017 03:37 PM    TRIG 46 01/14/2017 03:37 PM     Urine albumin Cr ratio on 06/09/2018 - 22 mg/mg Cr       ASSESSMENT AND RECOMMENDATIONS:    Kidney transplant function - deceased donor kidney transplant with peds enbloc kidneys March 2014   - baseline Cr around 0.8 mg/dL.  Most recent value slightly higher at 0.93 mg/dL.  He may be slightly volume depleted as he reports urinating a lot from hyperglycemia.   - no significant proteinuria  - he is 5 years post transplant; no clear utility to monitor BK and DSA   - he has peritransplant bruit, although clinical suspicion of transplant renal artery stenosis is not high given stable renal function     Management of Immunosuppression  - continue mycophenolate at 750 mg twice daily   - continue tacrolimus twice daily.  Target trough level between 5-8 ng/ml     Volume status and hypertension:    - appears euvolemic  - BP reading is high in clinic today though he reports better control at home.  I did not make changes to his regimen today     Type II  diabetes mellitus:   - control is still poor.  He will continue to follow up with his endocrinologist     Hematologic   - no significant issue with drug induced leukopenia or anemia     Electrolytes:  - no significant issues       Patient Instructions   - No change on the anti-rejection medications    - continue mycophenolate 750 mg twice a day   - continue tacrolimus 4 mg twice a day   - continue routine labs every 2 months  - return to clinic in one year               Leim Fabry, MD  Attending Physician  PI# [id number]  Department of Internal Medicine  Section of Transplant Nephrology  Pager # [phone]  Office # [phone]    06/10/2018

## 2018-07-09 ENCOUNTER — Other Ambulatory Visit: Payer: Self-pay

## 2018-07-11 ENCOUNTER — Other Ambulatory Visit: Payer: Self-pay

## 2018-07-21 ENCOUNTER — Other Ambulatory Visit: Payer: Self-pay

## 2018-08-05 ENCOUNTER — Other Ambulatory Visit: Payer: Self-pay

## 2018-08-06 ENCOUNTER — Other Ambulatory Visit: Payer: Self-pay

## 2018-08-18 ENCOUNTER — Encounter: Payer: Self-pay | Admitting: Transplant Surgery

## 2018-08-22 DIAGNOSIS — N186 End stage renal disease: Secondary | ICD-10-CM | POA: Insufficient documentation

## 2018-08-23 NOTE — Committee Review (Signed)
10/29/2012   [facility]   final review:  entire eval reviewed and approved to list.  clinic after cards 04-2014.  write in list letter to maintain good compliance with dialysis

## 2018-09-02 ENCOUNTER — Other Ambulatory Visit: Payer: Self-pay

## 2018-09-04 ENCOUNTER — Other Ambulatory Visit: Payer: Self-pay

## 2018-09-05 ENCOUNTER — Other Ambulatory Visit: Payer: Self-pay

## 2018-09-25 ENCOUNTER — Other Ambulatory Visit: Payer: Self-pay

## 2018-09-30 ENCOUNTER — Other Ambulatory Visit: Payer: Self-pay

## 2018-10-11 ENCOUNTER — Ambulatory Visit: Admit: 2018-10-11 | Discharge: 2018-10-12 | Payer: MEDICARE | Attending: Nephrology

## 2018-10-16 NOTE — Progress Notes (Signed)
Called pt to confirm referral for kidney [facility] [place] evaluation was  received. Pt did not pick up, left message with scheduler?s contact  information incase no further contact in a few weeks.  Electronically entered by: Meyer CoryGaule, Michael on 10/16/2018 3:21:00 PM

## 2018-11-03 ENCOUNTER — Other Ambulatory Visit: Payer: Self-pay

## 2018-11-04 ENCOUNTER — Other Ambulatory Visit: Payer: Self-pay

## 2018-11-05 NOTE — Progress Notes (Addendum)
Received PAK alone referral on 11/11/18 - Uploaded in [facility]    Appended Date: 11/13/2018 10:10 AM        By: Irving Shows  ~~~~~~~~~~~~~~~~~~~~~~~~~  ~~~~~~~~~~~~~~~~~~~~~~~~~  ~~~~~~~~~~    Placed call to Dr. Gaye Pollack (referring office) and request for Pancreas  eval referral with updated auth as initial referral was for kidney alone.    Appended Date: 11/07/2018 09:31 AM        By: Irving Shows  ~~~~~~~~~~~~~~~~~~~~~~~~~  ~~~~~~~~~~~~~~~~~~~~~~~~~  ~~~~~~~~~~    Received a call from pt. Per pt, the referral is for Pancreas alone as he  already had a kidney tx in 2014. Completed intake questionnaire and  scheduled PAK evaluation on 12/17/18 @ 1 pm.  Electronically entered by: Irving Shows on 11/05/2018 4:05:00 PM

## 2018-11-18 ENCOUNTER — Other Ambulatory Visit: Payer: Self-pay

## 2018-11-18 NOTE — Progress Notes (Signed)
Mailed an evaluation packet to patient by [facility] (temp) today.  Electronically entered by: Glean Salen on 11/18/2018 2:48:00 PM

## 2018-11-27 ENCOUNTER — Other Ambulatory Visit: Payer: Self-pay

## 2018-12-01 ENCOUNTER — Other Ambulatory Visit: Payer: Self-pay

## 2018-12-10 NOTE — Progress Notes (Signed)
Pending - Imaging  Submitted request to obtain CD of CT A/P 12.21.19 from [facility].  Electronically entered by: Reather Converse on 12/10/2018 3:50:00 PM

## 2018-12-10 NOTE — Progress Notes (Signed)
Medical Records  Uploaded medical records in [facility].   Electronically entered by: Reather Converse on 12/10/2018 3:49:00 PM

## 2018-12-11 NOTE — Progress Notes (Signed)
Imaging in [facility] pushed the imaging of CT A/P 12.21.19 in [facility].   Electronically entered by: Reather Converse on 12/11/2018 3:39:00 PM

## 2018-12-12 NOTE — Progress Notes (Signed)
Received a call from pt. Patient states he just had a back surgery and  physically/mentally not prepared to come next week.  Resched appt on 12/24/18  @ 1 pm. Notified FC and intake team via email.  Electronically entered by: Irving Shows on 12/12/2018 10:42:00 AM

## 2018-12-12 NOTE — Progress Notes (Signed)
Mailed an eval letter to patient today.  Electronically entered by: Glean Salen on 12/12/2018 10:46:00 AM

## 2018-12-22 MED ORDER — ACETAMINOPHEN 300 MG-CODEINE 30 MG TABLET
300-30 | ORAL | Status: AC
Start: 2018-12-22 — End: ?

## 2018-12-22 MED ORDER — TACROLIMUS 1 MG CAPSULE
1 | ORAL | Status: AC
Start: 2018-12-22 — End: ?

## 2018-12-22 MED ORDER — MYCOPHENOLATE MOFETIL 250 MG CAPSULE
250 | ORAL | Status: AC
Start: 2018-12-22 — End: ?

## 2018-12-22 MED ORDER — CLINDAMYCIN HCL 300 MG CAPSULE
300 | ORAL | Status: AC
Start: 2018-12-22 — End: ?

## 2018-12-22 MED ORDER — CYCLOBENZAPRINE 10 MG TABLET
10 | ORAL | Status: AC | PRN
Start: 2018-12-22 — End: ?

## 2018-12-22 MED ORDER — ESCITALOPRAM 20 MG TABLET
20 | ORAL | Status: AC
Start: 2018-12-22 — End: ?

## 2018-12-22 MED ORDER — NOVOLOG FLEXPEN U-100 INSULIN ASPART 100 UNIT/ML (3 ML) SUBCUTANEOUS
100 | SUBCUTANEOUS | Status: AC
Start: 2018-12-22 — End: ?

## 2018-12-22 MED ORDER — FERROUS GLUCONATE 240 MG (27 MG IRON) TABLET
240 | ORAL | Status: AC
Start: 2018-12-22 — End: ?

## 2018-12-22 MED ORDER — DULOXETINE 30 MG CAPSULE,DELAYED RELEASE
30 | ORAL | Status: AC
Start: 2018-12-22 — End: ?

## 2018-12-22 MED ORDER — CHOLECALCIFEROL (VITAMIN D3) 50 MCG (2,000 UNIT) CAPSULE
50 | ORAL | Status: AC
Start: 2018-12-22 — End: ?

## 2018-12-22 MED ORDER — ASPIRIN-CALCIUM CARBONATE 81 MG-300 MG CALCIUM (777 MG) TABLET
81 | ORAL | Status: AC
Start: 2018-12-22 — End: ?

## 2018-12-22 MED ORDER — INSULIN DETEMIR (U-100) 100 UNIT/ML (3 ML) SUBCUTANEOUS PEN
100 | SUBCUTANEOUS | Status: AC
Start: 2018-12-22 — End: ?

## 2018-12-22 MED ORDER — BUPROPION HCL XL 300 MG 24 HR TABLET, EXTENDED RELEASE
300 | ORAL | Status: AC
Start: 2018-12-22 — End: ?

## 2018-12-22 MED ORDER — ATORVASTATIN 40 MG TABLET
40 | ORAL | Status: AC
Start: 2018-12-22 — End: ?

## 2018-12-22 MED ORDER — IBUPROFEN 600 MG TABLET
600 | ORAL | Status: AC
Start: 2018-12-22 — End: ?

## 2018-12-22 MED ORDER — GABAPENTIN 300 MG CAPSULE
300 | ORAL | Status: AC
Start: 2018-12-22 — End: ?

## 2018-12-22 MED ORDER — FAMOTIDINE 20 MG TABLET
20 | ORAL | Status: AC
Start: 2018-12-22 — End: ?

## 2018-12-22 MED ORDER — DOXYCYCLINE HYCLATE 100 MG CAPSULE
100 | ORAL | Status: AC
Start: 2018-12-22 — End: ?

## 2018-12-24 ENCOUNTER — Other Ambulatory Visit: Payer: Self-pay

## 2018-12-24 DIAGNOSIS — E1122 Type 2 diabetes mellitus with diabetic chronic kidney disease: Secondary | ICD-10-CM

## 2018-12-24 DIAGNOSIS — N186 End stage renal disease: Secondary | ICD-10-CM

## 2018-12-24 DIAGNOSIS — E1121 Type 2 diabetes mellitus with diabetic nephropathy: Secondary | ICD-10-CM

## 2018-12-24 DIAGNOSIS — Z940 Kidney transplant status: Secondary | ICD-10-CM

## 2018-12-24 NOTE — Progress Notes (Signed)
CASE CLOSED / Chartless  Pt had the opportunity to listen to a slide presentation about pancreas  transplantation and had an extensive discussion w/ the transplant team.  D/t   increased surgical risk of pancreas transplantation and the need for  a larger operation overall and the fact that there is no good evidence that  getting a pancreas after kidney transplant will prolong  graft survival, and that his best option would be to try to tighten up his  glucose control with the help of a diabetologist. pt was deemed not a  candidate for a pancreas alone transplant.  Pt were in agreement of the plan and was advised to discuss other options  w/ his nephrologist.  F/u letter including what was discussed will be sent to patient, and  nephrologist.    Electronically entered by: Dante Gang on 12/24/2018 4:16:00 PM

## 2018-12-24 NOTE — Progress Notes (Signed)
[facility] KIDNEY/PANCREAS TRANSPLANT  SOCIAL WORK PSYCHOSOCIAL ASSESSMENT    RE: Carlos Hill   MRN: [id number]   DATE OF SERVICE: 12/24/2018    IDENTIFYING INFORMATION: Carlos Hill is a 53 y.o. English-speaking Hispanic male who presented for his initial pancreas transplant evaluation alone. The patient was evaluated without the assistance of an interpreter. The patient's primary renal disease diagnosis is DMII. Per his chart, HTN and GERD are his other significant health issues. He had a previous DDRT on 12/22/12 at [facility] and is not on dialysis.      SOCIAL HISTORY: The patient was born and raised in [place]. He currently resides in [place] in a house that he rents and shares with his wife, daughter, and 2 grand sons( 48 and 80). The patient has been married to his wife since 77. He has 3 adult children, 1 son in [place] and 2 daughters, 1 in [place], 1 in [place]. The patient's parents are in [place]. He has 1 brother in [place] and 2 sisters in [place]. The patient completed high school and previously worked as an Librarian, academic for a Teacher, early years/pre. The patient denied any history of military service. No special learning needs were identified in the course of the evaluation. He enjoys being with his grandsons in his spare time. He identified Mormon and Jewish religious affiliations and is Market researcher. The patient denied any history of legal issues.    CAREGIVER/SOCIAL SUPPORT: The patient reported that his wife, Carlos Hill and daughter, Carlos Hill [phone]), would be primary supports following the transplant able to provide assistance and transportation. He stated neither are working outside of the home though his daughter does not drive so his wife would be the driver. He identified his son, Carlos Hill [phone]) as a back up support.     FUNCTIONAL STATUS: The patient reported overall he feels good. He reported that he is independent with all self-care and ADLs, including household management  tasks, shopping, and errands. He continues to drive and has a disabled parking placard. The patient currently uses no durable medical equipment. For exercise, he walks. The patient is on a renal diet and said that his appetite is good. He denied any problems with vision or sleep. The patient reported he perceives his quality of life as good.     MENTAL HEALTH: The patient reported that his primary mood is "good." He denied current symptoms of depression or anxiety. He endorsed a history of depression which initiated when he was diagnosed in 2012. He stated that his depression led to poor follow up and adherence with his transplant care in 2017. He reported he was prescribed Wellbutrin and Lexapro by his PCP, though stopped taking these on his own last year as he didn't like the side effects. He denied any history of formal mental health treatment, psychiatric hospitalizations, suicidal ideation, or homicidal ideation. The patient denied any current stressors outside of situational grief around his medical issues. The patient identified his faith as his primary coping strategy. He further identified singing as an additional coping strategy.      ABUSE/VIOLENCE/TRAUMA: The patient denied any current or history of physical, sexual, or emotional abuse or intimate partner violence. He denied any concerns about his safety at this time. The patient denied any history of trauma.    SUBSTANCE USE:   Tobacco: No past or current use reported.   Alcohol: No history of abuse reported.  Cannabis: No history of use reported.  Illicit Drugs: No past or  current use reported.  Prescription Drugs: No history of misuse reported. Patient denied any current use.  Treatment History: None reported.    ADHERENCE: The patient reported that he currently follows treatment recommendations, keeps all scheduled medical appointments, and takes all medications as prescribed. Patient reported he manages his medications independently. At home, he  uses the bottles to organize his medications and denied needing any reminders to take them. Per chart, [facility] noted adherence concerns including poor follow up with appointments, labs, and concerns for medication non compliance. Per [facility], patient was lost to care from 2016-2018. SW addressed with patient who admitted to poor follow up due to "personal reasons" though would not elaborate when prompted. He later alluded to depression as a reason for his poor follow up and stated he was only getting labs every 3-6 months. Patient stated feels as though he is in a better place now and is able to manage his medical care. He appears to have an adequate understanding of the importance of adhering to clinic and medication schedules after transplant.    MOTIVATION FOR TRANSPLANT: The patient appears to be motivated to pursue transplant in order to possibly extend and improve the quality of his life. He appears to have a good understanding of the risks and benefits of transplantation. After a successful procedure, the patient hopes to be healthy and be diabetes free.     FINANCIAL/INSURANCE: The patient is disabled. The patients source of income is SSDI and he has Medicare and [employer]. The patient denied receiving any insurance premium assistance from the [employer]. He denies experiencing significant financial hardship at this time. The patient will speak with a [place] transplant financial counselor to review the stability of finances and insurance.     ADVANCE HEALTH CARE DIRECTIVE: The patient does not have an [place] Directive. The purpose of the AHCD was reviewed with the patient and he was provided with the forms. The patient agreed that if he completed the document, he would get a copy to Congress.     INTERVENTIONS/EDUCATION:   1.) Provided information and education regarding confidentiality and mandatory reporting requirements.  2.) Provided information to the patient and his family about the usual  course of patients requiring transplant, including the psychosocial stresses of the transplant process; some possible side effects of medications; the need for ongoing practical and emotional support, including 24-hour caregiving availability during the first 6 weeks post-transplant; the importance of residential proximity and transportation to the hospital for the first 6 weeks post-transplant; the importance of maintaining insurance coverage and adhering to the prescribed medical regimen, including medications and clinic follow-up; the importance of communicating with staff any problems or changes that may occur; and returning to pre-transplant roles.  3.) Reviewed the post-transplant time limitations of Medicare, SSDI and AK[employer] insurance premium assistance with the patient. FMLA, SDI and PFL were discussed with the patient. Informed the patient that the costs associated with transportation (gas/tolls/parking/bus), lodging, meals, insurance, medications, deductibles, and copayments are his financial responsibility.   4.) Discussed the benefits of completing an Advance Directive and provided patient with [place] Advance Directive kit and instructions for completion. Requested that patient provide [place] with copy of form for medical chart.  5.) Provided supportive counseling to patient and family regarding stressors related to advancing chronic illness.  6.) Reviewed the  Kidney/Pancreas Transplant Agreement with the patient and caregiver, obtained their signatures, and provided him with a copy.    CLINICAL IMPRESSIONS: The patient presented  on time, cooperative, and engaged. The patient was pleasant and answered most questions asked of him, though was evasive at times and declined to explore his non adherence in 2017. The patient was neatly dressed and groomed, maintained good eye contact, presented as alert and oriented x4, and had affect appropriate for the situation. The patient displayed self-awareness and  insight regarding the impact his health has had on various areas of his life. He appears to possess adequate coping skills in the face of multiple psychosocial stressors. Risk factors include history of non adherence and poor follow up with [facility] after DDRT, history of depression and self weaned from antidepressants, and no mental health treatment history. Protective factors include patient appears motivated to improve and maintain adherence, no substance abuse history, stable housing and insurance, and patient appears active and high functioning.    PSYCHOSOCIAL RECOMMENDATIONS/PLAN:     Moderate risk psychosocial concerns exist, and transplantation is recommended from psychosocial perspective provided the patient demonstrates compliance with the identified plan of care.     1.) Patient would need to demonstrate ongoing adherence to post transplant care with [place] with recommendation/clearance from [facility] transplant provider.   2.) Patient may need psychiatric evaluation to assess barrier to adherence given history of depression with no treatment and self weaned from antidepressants.   3.) Patient' support plan would need to be verified due to distance from [place].       Social work will continue to be available for ongoing education, assessment, and support as needed and in collaboration with the treatment team. All contact information was provided.    Larene Pickett, [place]

## 2018-12-24 NOTE — Progress Notes (Signed)
Pt arrived for kidney tx eval and watched [facility] Education Video on  12/24/2018 . Was seen by Dr. Bosie Clos, P. Tenoso, and S. Rock.  All consent  forms signed. Pt received SRTR (Version Fall 2019)      Electronically entered by: Glean Salen on 12/24/2018 4:21:00 PM

## 2018-12-24 NOTE — H&P (Signed)
Thank you for referring your patient [name] to be evaluated for renal transplantation. He was seen in consultation on 12/24/2018 at the [facility] on [address] and presented to this visit alone.       HISTORY OF PRESENT ILLNESS:    Interpreter Services? Not required.    As you know, the patient is a 53 y.o. male with ESRD secondary to DM, who is s/p a pedi en bloc renal tx at [facility].  He is seeking to prolong his renal transplant survival by getting a pancreas transplant.  He does not have hypoglycemic unawareness or episodes of DKA.  He does have a high HgbA1c in the 10 range. There has been some compliance issues in the past following up on his kidney tx, per [facility] records.    Patient Active Problem List    Diagnosis Date Noted    History of methicillin resistant Staphylococcus aureus infection 12/22/2018     This problem was added by Discern Expert for Lab confirmed MRSA.   PER POLICY, CONSULT YOUR INFECTION CONTROL PRACTITIONER IF YOU FEEL THIS CONDITION SHOULD BE MARKED "RESOLVED"      Mixed hyperlipidemia 11/21/2018    Diabetic ulcer of left heel associated with diabetes mellitus due to underlying condition ([place]) 11/20/2018    Acquired trigger finger of left middle finger 06/17/2017    Right carpal tunnel syndrome 04/01/2017    Diabetic polyneuropathy ([place]) 03/29/2014    Gastroparesis 03/05/2013    History of kidney transplant 01/27/2013    Long-term use of immunosuppressant medication 01/20/2013    Type 2 diabetes mellitus ([place]) 01/13/2013    HTN (hypertension) 01/13/2013    Hyperparathyroidism due to renal insufficiency ([place]) 07/17/2012    Gastroesophageal reflux disease 07/17/2012    Depression 07/17/2012       Past Medical History:   Diagnosis Date    Acquired trigger finger of left middle finger 06/17/2017    Atherosclerosis of abdominal aorta ([place])     10/12/18 CTAP showed atherosclerotic disease of the abdominal aorta is noted extending the  common iliac arteries without aneurysmal dilatation    Chronic diarrhea     had colo 06/16/18.   Random colon bx showed no significant histologic abnormality; no dysplasia or malignancy identified.  Consider microscopic colitis    Depression 07/17/2012    on Lexapro, Welbutrin    Diabetic polyneuropathy ([facility])     on Gabapentin    Diabetic retinopathy of both eyes ([place])     requiring laser surgery.    Diabetic ulcer of left heel associated with diabetes mellitus due to underlying condition ([place]) 11/20/2018    had podiatrist apt 12/02/18 for f/u on blister formation on left posterior lateral heel. Per notes - has all converted into a dry scab - upon debridement, he has brand new skin underneath.   Arterial duplex study of bil LE (12/01/18) showed normal bilateral triphasic Doppler waveform in both lower extremities. Mild diffuse atherosclerotic plaques were noted bilaterally.       Fatty infiltration of liver     per 10/12/18 CTAP;  with very mild central intrahepatically duct dilatation.  Normal spleen    Gastroesophageal reflux disease 07/17/2012    Gastroparesis 03/05/2013    History of kidney transplant 12/22/2012    was on hemodialysis starting 11/07/10 prior to DDRT at [facility].  Maintained on Cellcept and Prograf.  Labs from 11/11/18: crea=0.80, eGFR=103.  F/u with [facility] have been sporadic.  Prior to  06/10/18 visit, he was seen in 01/2017 and 2015      History of methicillin resistant Staphylococcus aureus infection     HTN (hypertension) 2007    currently not on BP meds.  Had CWU done for chest pain - Echo 08/23/17: EF>60%, mild MR. Normal LV and RV size & fxn.  Neg MPS (08/23/17)    Hyperparathyroidism due to renal insufficiency ([place])     Mixed hyperlipidemia 11/21/2018    Non compliance with medical treatment      F/u with [facility] have been sporadic.  Prior to 06/10/18 visit, he was seen in 01/2017 and 05/2015, 2015.   Per 06/02/18 endo notes, pt was last seen over a year ago and non  compliant on therapy nor follow-up.  HgbA1c=11.3% (05/30/18)    Type 2 diabetes mellitus ([place])     dx'd at age 49.  Started insulin ~age 45.  On Levemir 20 units and Novolog sliding scale TID.  HgbA1c=11.3% (05/30/18)       Allergies/Contraindications  Allergies not on file    Current Outpatient Medications   Medication Sig    acetaminophen-codeine (TYLENOL #3) 300-30 mg tablet     aspirin-calcium carbonate 81 mg-300 mg calcium(777 mg) TAB Take 81 mg by mouth daily.    atorvastatin (LIPITOR) 40 mg tablet Take 40 mg by mouth.    buPROPion (WELLBUTRIN XL) 300 mg 24 hr tablet Take 300 mg by mouth daily.    cholecalciferol, vitamin D3, 50 mcg (2,000 unit) CAP capsule Take 1 capsule by mouth daily.    clindamycin (CLEOCIN) 300 mg capsule TAKE 1 CAPSULE BY MOUTH EVERY 6 HOURS FOR 14 DAYS    cyclobenzaprine (FLEXERIL) 10 mg tablet Take 10 mg by mouth 3 (three) times daily as needed.    doxycycline (VIBRAMYCIN) 100 mg capsule Take 100 mg by mouth 2 (two) times daily.    DULoxetine (CYMBALTA) 30 mg DR capsule Take 30 mg by mouth daily.    escitalopram oxalate (LEXAPRO) 20 mg tablet Take 20 mg by mouth daily.    famotidine (PEPCID) 20 mg tablet Take 20 mg by mouth.    ferrous gluconate (FERATE) 240 mg (27 mg iron) tablet Take 240 mg by mouth daily.    gabapentin (NEURONTIN) 300 mg capsule Take 300 mg by mouth 3 (three) times daily.    ibuprofen (ADVIL,MOTRIN) 600 mg tablet 1 tablet with food or milk as needed    insulin detemir U-100 (LEVEMIR FLEXTOUCH U-100 INSULN) 100 unit/mL (3 mL) injection pen Inject 20 Units under the skin.    mycophenolate (CELLCEPT) 250 mg capsule Take by mouth 2 (two) times daily.    NOVOLOG FLEXPEN U-100 INSULIN 100 unit/mL (3 mL) PENINSULN injection pen INJECT 7 UNITS SUBCUTANEOUSLY BEFORE EACH MEAL PLUS CORRRECTION SCALE 1 25 RATIO WHEN BLOOD GLUCOSE IS ABOVE 175 (MAX 40 UNITS DAY) NEED LAB    tacrolimus (PROGRAF) 1 mg capsule Take 4 mg by mouth Twice a day.     No current  facility-administered medications for this visit.        Past Surgical History:   Procedure Laterality Date    CARPAL TUNNEL RELEASE      bilateral    CHOLECYSTECTOMY  04/29/2008    KIDNEY TRANSPLANT  12/22/2012    [facility] placed on LLQ (en-bloc kidneys from a 10.8-kg donor).  C/b slow graft function 2' ATN not requiring dialysis. 1 year surveillance biopsy in March 2015 showed no evidence of rejection.     rotator  cuff repair left  19/62/2297    UMBILICAL HERNIA REPAIR         Social History     Socioeconomic History    Marital status: Married     Spouse name: Not on file    Number of children: Not on file    Years of education: Not on file    Highest education level: Not on file   Occupational History    Not on file   Social Needs    Financial resource strain: Not on file    Food insecurity:     Worry: Not on file     Inability: Not on file    Transportation needs:     Medical: Not on file     Non-medical: Not on file   Tobacco Use    Smoking status: Not on file   Substance and Sexual Activity    Alcohol use: Not on file    Drug use: Not on file    Sexual activity: Not on file   Lifestyle    Physical activity:     Days per week: Not on file     Minutes per session: Not on file    Stress: Not on file   Relationships    Social connections:     Talks on phone: Not on file     Gets together: Not on file     Attends religious service: Not on file     Active member of club or organization: Not on file     Attends meetings of clubs or organizations: Not on file     Relationship status: Not on file    Intimate partner violence:     Fear of current or ex partner: Not on file     Emotionally abused: Not on file     Physically abused: Not on file     Forced sexual activity: Not on file   Other Topics Concern    Not on file   Social History Narrative    Not on file       No family history on file.    REVIEW OF SYSTEMS:  A complete review of systems was performed and the medical history questionnaire  was reviewed with the patient. The pertinent negative findings include: no chest pain/claudication. The pertinent positive findings include: none. The remainder of the review of systems was negative.    Current physical activity: active    PHYSICAL EXAM:  There were no vitals taken for this visit.  GENERAL: Well appearing, in no acute distress.  INTEGUMENT: No spider nevi, jaundice, or rash. No clubbing, cyanosis.  LYMPHATICS: There is no cervical or supraclavicular adenopathy.  HEENT: Head is normocephalic and atraumatic. Conjunctivae are pink, and sclerae and anicteric.EOMI. PERRL. Oropharynx clear. Mucous membranes moist.  NECK: Supple. Trachea is midline. There are no masses or thyromegaly.  RESPIRATORY: Normal inspiratory effort. Lungs clear to auscultation.  CARDIOVASCULAR: Regular rate. Normal S1 and S2. No S3 or rub.The patient has intact femoral and distal pulses. No LE edema.  GI: Abdomen is soft and non-tender. There are no masses or organomegaly. Bowel sounds are present.  MUSCULOSKELATAL:  No spine or CVA tenderness. Normal joint exam. Muscle mass/tone is normal.  NEUROLOGIC: Cranial nerves intact .Nonfocal examination without asterixis.  PSYCHIATRIC:  Alert and oriented time three.The patient has appropriate mood and affect.      ASSESSMENT AND PLAN:    IMPRESSION:     Carlos Hill is a 53 y.o.  male with a history of DM  as the cause of CKD/ESRD for evaluation for a  Pancreas after kidney transplant.     We discussed the increased surgical risk of pancreas transplantation and the need for a larger operation overall.  We discussed the fact that there is no good evidence that getting a pancreas after kidney transplant will prolong graft survival, and that his best option would be to try to tighten up his glucose control with the help of a diabetologist. He appeared to be quite satisfied with this information and agreed not to be listed for pancreas transplantation.           I spent 30 minutes  face-to-face with the patient in counseling regarding the risks and benefits of transplantation, national and center specific graft and patient outcomes, and the factors affecting success of transplantation.    Thank you for referring Mr. Dendinger to our Transplant Service. If you have questions, please do not hesitate to contact me.

## 2018-12-24 NOTE — Progress Notes (Signed)
review of med records  review of med records.  Pt's for PAK (pancreas after transplant) evaluation    53 y/o male with Type II DM  Was on hemodialysis starting 11/07/10 prior to DDRT at [facility].  Placed on  LLQ (en-bloc kidneys from a 10.8-kg donor).  C/b slow graft function 2' ATN  not requiring dialysis. 1 year surveillance biopsy in  March 2015 showed no evidence of rejection.   Maintained on Cellcept and  Prograf.  Labs from 11/11/18: crea=0.80, eGFR=103.    F/u with [facility] have been sporadic.  Prior to 06/10/18 visit, he  was seen in 01/2017, 05/2015 and 2015    DM II - dx'd at age 45.  Started insulin ~age 89.  On Levemir 20 units  and Novolog sliding scale TID.  HgbA1c=11.3% (05/30/18)  Diabetic polyneuropathy -  on Gabapentin   Diabetic retinopathy of both eyes -  requiring laser surgery.   Diabetic ulcer of left heel associated with diabetes mellitus due to  underlying condition  11/20/2018 had podiatrist apt 12/02/18 for f/u on  blister formation on left posterior lateral heel. Per notes - has all  converted into a dry scab - upon debridement, he has brand new skin  underneath.   Arterial duplex study of bil LE (12/01/18) showed normal  bilateral triphasic Doppler waveform in both lower extremities. Mild  diffuse atherosclerotic plaques were noted bilaterally.      Gastroparesis 03/05/2013    HTN (hypertension) since 2007 - currently not on BP meds.  Had CWU done for  chest pain - Echo 08/23/17: EF>60%, mild MR. Normal LV and RV size & fxn.  Neg MPS (08/23/17)  Atherosclerosis of abdominal aorta   - 10/12/18 CTAP showed atherosclerotic  disease of the abdominal aorta is noted extending the common iliac arteries  without aneurysmal dilatation    Acquired trigger finger of left middle finger 06/17/2017    Chronic diarrhea - had colo 06/16/18.   Random colon bx showed no significant histologic abnormality  no dysplasia or malignancy identified.  Consider microscopic colitis  Depression 07/17/2012 on Lexapro,  Welbutrin   Fatty infiltration of liver per 10/12/18 CTAP  with very mild central intrahepatically duct dilatation.  Normal spleen  GERD 07/17/2012    History of methicillin resistant Staphylococcus aureus infection     Hyperparathyroidism due to renal insufficiency      Mixed hyperlipidemia 11/21/2018    Non compliance with medical treatment -  F/u with [facility] have  been sporadic.  Prior to 06/10/18 visit, he was seen in 01/2017 and 05/2015,  2015.   Per 06/02/18 endo notes, pt was last seen over a year  ago and non compliant on therapy nor follow-up.  HgbA1c=11.3% (05/30/18)     Surgical History   CARPAL TUNNEL RELEASE  bilateral   CHOLECYSTECTOMY 04/29/2008    KIDNEY TRANSPLANT 12/22/2012 [facility]   rotator cuff repair left 80/32/1224    UMBILICAL HERNIA REPAIR               Electronically entered by: Alveda Reasons on 12/24/2018 8:22:00  AM

## 2018-12-24 NOTE — Patient Instructions (Signed)
The patient was accompanied by  __. SRTR form given.     Discussed the Increased Risk Donor Program - packet info given and instructed to returned signed consent if interested    Please go down to the laboratory on the ground floor for blood draw -  please have your labs drawn. Please allow 2-3 weeks to receive all final results through MyChart.     You have been provided your transplant coordinator's business card with contact information for any additional questions and concerns.

## 2018-12-25 ENCOUNTER — Ambulatory Visit: Admit: 2018-12-25 | Discharge: 2018-12-25 | Payer: MEDICARE

## 2018-12-25 NOTE — Progress Notes (Signed)
Sw met with patient alone at initial transplant evaluation  see [facility] for full psychosocial assessment details.  CAREGIVER/SOCIAL SUPPORT: The patient reported that his wife, Carlos Hill  and daughter, Carlos Hill [phone]), would be primary supports  following the transplant able to provide assistance and  transportation. He stated neither are working outside of the home though  his daughter does not drive so his wife would be the driver. He identified  his son, Carlos Hill [phone]) as a back up  support.   Risk factors include history of non adherence and poor follow up with [facility] after DDRT, history of depression and self weaned from  antidepressants, and no mental health treatment history. Protective factors  include patient appears motivated to improve and maintain adherence, no  substance abuse history, stable housing and insurance, and patient appears  active and high functioning.  ?  PSYCHOSOCIAL RECOMMENDATIONS/PLAN:   ?  Moderate risk psychosocial concerns exist, and transplantation is  recommended from psychosocial perspective provided the patient demonstrates  compliance with the identified plan of care.  ?  1.) Patient would need to demonstrate ongoing adherence to post transplant  care with [facility] with recommendation/clearance from [facility] transplant  provider.  2.) Patient may need psychiatric evaluation to assess barrier to adherence  given history of depression with no treatment and self weaned from  antidepressants.  3.) Patient' support plan would need to be verified due to distance from  [place].  ?  ?  Social work will continue to be available for ongoing education,  assessment, and support as needed and in collaboration with the treatment  team. All contact information was provided.  ?  Larene Pickett, [place]  [phone]    Electronically entered by: Larene Pickett on 12/25/2018 11:38:00 AM

## 2018-12-26 ENCOUNTER — Other Ambulatory Visit: Payer: Self-pay

## 2018-12-31 ENCOUNTER — Other Ambulatory Visit: Payer: Self-pay

## 2019-01-08 ENCOUNTER — Other Ambulatory Visit: Payer: Self-pay

## 2019-01-19 NOTE — Progress Notes (Signed)
TURN DOWN LETTER sent to patient and MD  Electronically entered by: Wynelle Cleveland on 01/19/2019 8:27:00 AM

## 2019-01-20 ENCOUNTER — Other Ambulatory Visit: Payer: Self-pay | Admitting: Internal Medicine

## 2019-01-20 ENCOUNTER — Other Ambulatory Visit: Payer: Self-pay

## 2019-01-20 DIAGNOSIS — Z940 Kidney transplant status: Principal | ICD-10-CM

## 2019-01-20 MED ORDER — TACROLIMUS 1 MG CAPSULE, IMMEDIATE-RELEASE
ORAL_CAPSULE | ORAL | 3 refills | Status: DC
Start: 2019-01-20 — End: 2020-01-05
  Filled 2019-01-20: fill #0
  Filled 2019-01-27: qty 240, 30d supply, fill #0
  Filled 2019-02-20: qty 240, 30d supply, fill #1
  Filled 2019-03-20: qty 240, 30d supply, fill #2
  Filled 2019-04-14: qty 240, 30d supply, fill #3
  Filled 2019-05-12: qty 240, 30d supply, fill #4
  Filled 2019-06-08: qty 240, 30d supply, fill #5
  Filled 2019-07-03: qty 240, 30d supply, fill #6
  Filled 2019-07-30: qty 240, 30d supply, fill #7
  Filled 2019-08-27: qty 240, 30d supply, fill #8
  Filled 2019-09-29: qty 240, 30d supply, fill #9
  Filled 2019-10-27: qty 240, 30d supply, fill #10
  Filled 2019-11-26: qty 240, 30d supply, fill #11

## 2019-01-20 MED ORDER — MYCOPHENOLATE MOFETIL 250 MG CAPSULE
750.0000 mg | ORAL_CAPSULE | Freq: Two times a day (BID) | ORAL | 3 refills | Status: DC
Start: 2019-01-20 — End: 2020-01-05
  Filled 2019-01-20: fill #0
  Filled 2019-01-27: qty 180, 30d supply, fill #0
  Filled 2019-02-20: qty 180, 30d supply, fill #1
  Filled 2019-03-20: qty 180, 30d supply, fill #2
  Filled 2019-04-14: qty 180, 30d supply, fill #3
  Filled 2019-05-12: qty 180, 30d supply, fill #4
  Filled 2019-06-08: qty 180, 30d supply, fill #5
  Filled 2019-07-03: qty 180, 30d supply, fill #6
  Filled 2019-07-30: qty 180, 30d supply, fill #7
  Filled 2019-08-27: qty 180, 30d supply, fill #8
  Filled 2019-09-29: qty 180, 30d supply, fill #9
  Filled 2019-10-27: qty 180, 30d supply, fill #10
  Filled 2019-11-26: qty 180, 30d supply, fill #11

## 2019-01-23 ENCOUNTER — Other Ambulatory Visit: Payer: Self-pay

## 2019-01-27 ENCOUNTER — Other Ambulatory Visit: Payer: Self-pay

## 2019-02-06 ENCOUNTER — Other Ambulatory Visit: Payer: Self-pay

## 2019-02-20 ENCOUNTER — Other Ambulatory Visit: Payer: Self-pay

## 2019-02-23 ENCOUNTER — Other Ambulatory Visit: Payer: Self-pay

## 2019-03-19 ENCOUNTER — Other Ambulatory Visit: Payer: Self-pay

## 2019-03-20 ENCOUNTER — Other Ambulatory Visit: Payer: Self-pay

## 2019-04-03 ENCOUNTER — Other Ambulatory Visit: Payer: Self-pay

## 2019-04-14 ENCOUNTER — Other Ambulatory Visit: Payer: Self-pay

## 2019-04-15 ENCOUNTER — Other Ambulatory Visit: Payer: Self-pay

## 2019-04-23 ENCOUNTER — Other Ambulatory Visit: Payer: Self-pay

## 2019-04-27 ENCOUNTER — Encounter: Payer: Self-pay | Admitting: Specialist

## 2019-04-27 ENCOUNTER — Ambulatory Visit: Payer: Medicare Other | Attending: Specialist | Admitting: Specialist

## 2019-04-27 ENCOUNTER — Other Ambulatory Visit: Payer: Self-pay

## 2019-04-27 ENCOUNTER — Ambulatory Visit: Payer: Medicare Other | Attending: Specialist

## 2019-04-27 VITALS — BP 145/84 | HR 100 | Temp 99.2°F | Ht 63.0 in | Wt 139.3 lb

## 2019-04-27 DIAGNOSIS — Z4822 Encounter for aftercare following kidney transplant: Secondary | ICD-10-CM | POA: Insufficient documentation

## 2019-04-27 DIAGNOSIS — Z79899 Other long term (current) drug therapy: Secondary | ICD-10-CM | POA: Insufficient documentation

## 2019-04-27 DIAGNOSIS — I10 Essential (primary) hypertension: Secondary | ICD-10-CM | POA: Insufficient documentation

## 2019-04-27 DIAGNOSIS — Z940 Kidney transplant status: Secondary | ICD-10-CM | POA: Insufficient documentation

## 2019-04-27 DIAGNOSIS — E119 Type 2 diabetes mellitus without complications: Secondary | ICD-10-CM | POA: Insufficient documentation

## 2019-04-27 DIAGNOSIS — Z5181 Encounter for therapeutic drug level monitoring: Secondary | ICD-10-CM

## 2019-04-27 LAB — OUTSIDE LABS
Alanine Transferase (ALT): 26
Albumin: 3.7
Alkaline Phosphatase (ALP): 123
Aspartate Transaminase (AST): 14
Bilirubin Total: 0.4
Calcium: 8.6
Carbon Dioxide Total: 30
Chloride: 99
Creatinine Blood: 0.91
Glucose Urine: >500
Glucose: 230
Hematocrit: 42.3 %
Hemoglobin: 13.9 g/dL
Leuk. Esterase: NEGATIVE
Magnesium (Mg): 1.6
Neutrophils % Auto: 69 %
Neutrophils Abs Auto: 3.4 10*3/uL
Nitrite Urine: NEGATIVE
Occult Blood Urine: NEGATIVE
Phosphorus (PO4): 3.4
Platelet Count: 192 10*3/uL
Potassium: 4.1
Protein Urine: NEGATIVE
Protein: 6.5
RBC/HPF: 1
Sodium: 134
Specific Gravity: 1.014
Urea Nitrogen, Blood (BUN): 13
WBC/HPF: NONE SEEN
White Blood Cell Count: 5 10*3/uL

## 2019-04-27 MED ORDER — METOPROLOL TARTRATE 25 MG TABLET
25.0000 mg | ORAL_TABLET | Freq: Two times a day (BID) | ORAL | 11 refills | Status: DC
Start: 2019-04-27 — End: 2020-12-19

## 2019-04-27 NOTE — Nursing Note (Signed)
Pt identified by 2 identifiers, name and date of birth. Vital signs taken, pain level and allergies verified.     Jermisha Hoffart MA

## 2019-04-27 NOTE — Progress Notes (Signed)
TRANSPLANT NEPHROLOGY OUTPATIENT PROGRESS NOTE    Patient name: Charles Galvan  MRN: [id number]  DATE OF SERVICE: 04/27/2019    CC: Management of transplanted kidney and immunosuppression     ESRD and TRANSPLANT HISTORY  Charles Galvan is a 13yrgentleman with a history of stage V chronic kidney disease due to type II diabetes mellitusand hypertension who received a deceased-donor renal transplant (en-bloc kidneys from a 10.8-kg donor) on03/03/14.He was nonsensitized with intermediate risk for CMV infection.      Posttransplant course was remarkable forslow graft function but he eventually achieved excellent allograft function with Cr around 0.8 mg/dL.  1 year surveillance biopsy in March 2015 showed no evidence of rejection.      His follow up at the transplant clinic has been sporadic in recent years.  He was last seen in April 2018.  Prior to that, last visit was in 2015.      INTERVAL HISTORY:  Since last seen in clinic   Seen local physician for diabetic control.  Still problematic.  No hospital admissions or surgeries in the last 6 months.  No cancers.  Blood pressure running slightly high.    REVIEW OF SYSTEMS:  Constitutional:    No fevers, chills, malaise, anorexia or excessive fatigue.    Ears/Nose/Mouth/Throat:  Denies mouth sores, dysphagia, odynophagia   Pulmonary:     No complaints of shortness of breath or cough.    Cardiovascular:   No complaints of palpitations, syncope, exertional chest pain.   Gastrointestinal:  No abdominal pain, nausea, vomiting, or diarrhea.    Genitourinary:    No complaints of dysuria, or hematuria  Integumentary:  No complaints of new rash, lumps, or moles   Musculoskeletal:    No complaints of joint pain, joint swelling, or muscle ache  Neuro:    No complaints of headaches, seizures.    Psych:    No complaints of depressed mood, insomnia, or anxiety.      PAST MEDICAL/SURGICAL HISTORY  Past Medical History:   Diagnosis Date    Diabetes mellitus ([place]     Hypertension      Kidney disease        SOCIAL HISTORY and FAMILY HISTORY   Reviewed and noncontributory    Allergies  No Known Allergies     Medications  Current Outpatient Medications   Medication Sig Dispense Refill    Blood Glucose Meter (ACCU-CHEK AVIVA PLUS) Kit 1 kit one time. 1 kit 3    Duloxetine (CYMBALTA) 30 mg Delayed Release Capsule Take 1 capsule by mouth every day. 30 capsule 2    FamoTIDine (PEPCID) 20 mg Tablet Tablet Take 1 tablet by mouth every morning. 30 tablet 6    ferrous sulfate 325 mg (65 mg iron) Tablet Take 1 tablet by mouth 2 times daily. 60 tablet 11    Insulin Glargine (LANTUS) 100 unit/mL Vial Inject 10 units subcutaneously daily 10 mL 6    insulin syr/ndl U100 half mark (BD INSULIN SYRINGE HALF UNIT MARKINGS) 0.3 mL 31 gauge x 5/16" Syringe use 1 syringe to inject lantus daily at bedtime 100 syringe 3    Insulin Syringe-Needle U-100 0.3 mL 31 gauge x 5/16 Use Daily with Lantus. 100 each 3    Metoprolol Tartrate (LOPRESSOR) 25 mg Tablet Take 1 tablet by mouth 2 times daily. 60 tablet 11    Mycophenolate (MMF) 250 mg Capsule Take 3 capsules by mouth 2 times daily. Or as directed. Do NOT take calcium  or antacids within 2 hours of any dose. +++ ICD 10: Z94.0 +++ 540 capsule 3    Pravastatin (PRAVACHOL) 20 mg Tablet Take 1 tablet by mouth every day. 30 tablet 5    Tacrolimus (PROGRAF) 1 mg Capsule Take 4 capsules by mouth twice a day; Or as directed by MD. Take doses every 12 hours. Take morning dose AFTER blood draw on lab days.  +++ ICD 10: Z 94.0 +++ 720 capsule 3     No current facility-administered medications for this visit.        PHYSICAL EXAM:  VITAL SIGNS:    BP 145/84 (SITE: right arm, Orthostatic Position: sitting, Cuff Size: regular)   Pulse 100   Temp 37.3 C (99.2 F) (Temporal)   Ht 1.6 m ('5\' 3"'$ )   Wt 63.2 kg (139 lb 5.3 oz)   SpO2 98%   BMI 24.68 kg/m   Wt Readings from Last 5 Encounters:   04/27/19 63.2 kg (139 lb 5.3 oz)   06/10/18 61.9 kg (136 lb 7.4 oz)   02/05/17  69.3 kg (152 lb 12.5 oz)   01/13/14 73.3 kg (161 lb 9.6 oz)   12/31/13 70.3 kg (155 lb)       CONSTITUION:  Appears stated age.  No acute distress   EYES:    No scleral icterus.  No conjunctivitis.    ENT:     Clear oropharynx. No oral thrush or ulcers.   CV:    RRR, no murmur. No peripheral edema.  RESPIRATORY:   CTAB; no crackles     GI:     Abdomen soft, non-tender, bowel sounds normal.   GU:   Positive peritransplant bruit.  No kidney allograft tenderness.      SKIN:    Surgical incision clean & dry.  No rash.  NEURO:   Alert and oriented times three.  No tremor.  PSYCH:    Appropriate affect.  Though content normal.      LAB TESTS/STUDIES:  Lab Results   Lab Name Value Date/Time    CR 0.91 04/23/2019 04:12 PM    CR 0.93 06/04/2018 12:42 PM    CR 0.75 02/04/2017 03:24 PM     Lab Results   Lab Name Value Date/Time    TACROLIMUS pend 04/23/2019 04:12 PM    TACROLIMUS 4.5 06/04/2018 12:42 PM    TACROLIMUS 7.1 02/04/2017 03:24 PM     Lab Results   Lab Name Value Date/Time    NA 134 04/23/2019 04:12 PM    K 4.1 04/23/2019 04:12 PM    CL 99 04/23/2019 04:12 PM    CO2 30 04/23/2019 04:12 PM    BUN 13 04/23/2019 04:12 PM    CR 0.91 04/23/2019 04:12 PM    GLU 230 04/23/2019 04:12 PM     Lab Results   Lab Name Value Date/Time    AST 14 04/23/2019 04:12 PM    ALT 26 04/23/2019 04:12 PM    ALP 123 04/23/2019 04:12 PM    ALB 3.7 04/23/2019 04:12 PM    TP 6.5 04/23/2019 04:12 PM    TBIL 0.4 04/23/2019 04:12 PM     Lab Results   Lab Name Value Date/Time    WBC 5.0 04/23/2019 04:12 PM    HGB 13.9 04/23/2019 04:12 PM    HCT 42.3 04/23/2019 04:12 PM    PLT 192 04/23/2019 04:12 PM       Lab Results   Lab Name Value Date/Time    HGBA1C  11.0 01/14/2017 03:37 PM     Lab Results   Lab Name Value Date/Time    HDL 49 01/14/2017 03:37 PM    TRIG 46 01/14/2017 03:37 PM         ASSESSMENT AND RECOMMENDATIONS:    Kidney transplant function - deceased donor kidney transplant with peds enbloc kidneys March 2014   - baseline Cr around 0.8-1  mg/dL.  Stable.   - no significant proteinuria  - he has peritransplant bruit, although clinical suspicion of transplant renal artery stenosis is not high given stable renal function     Management of Immunosuppression  - continue mycophenolate at 750 mg twice daily   - continue tacrolimus twice daily.  Tacrolimus level is pending.     Volume status and hypertension:    - appears euvolemic  - BP reading is high in clinic today though he reports better control at home.  I would recommend restarting metoprolol 25 mg twice a day.    Type II  diabetes mellitus:   - control is still poor.  He will continue to follow up with his endocrinologist     Hematologic   - no significant issue with drug induced leukopenia or anemia     Electrolytes:  -Within acceptable limits.    Bone/mineral metabolism.  Calcium and phosphorus are within normal limits.    Check urine for BK virus studies today.      RTC 6 months      Dr Denzil Magnuson  Transplant Nephrology  PI [phone]

## 2019-04-27 NOTE — Patient Instructions (Signed)
Restart metoprolol 25 mg twice a day    Urine test in our lab today    Do labs one week before clinic visit      Dr Denzil Magnuson  Transplant Nephrology

## 2019-04-28 LAB — BKV VIRAL LOAD, URINE: BKV Viral Load, Urine: <500 copies/mL (ref <500)

## 2019-04-28 NOTE — Progress Notes (Signed)
POST TRANSPLANT NUTRITION FOLLOW-UP  04/28/2019    Nutrition Assessment     Charles Galvan is a 53yr old male with past hx of DM2 who is status post DDRT on 12/2012. He is being seen today per his request for nutrition counseling for DM management.     Patient Report:  Patient reports he knows his DM control is poor. He acknowledges this is a problem and he feels open to working on it. He has not been check home BG (recently just found his meter and plans to start check sugars at home again), he states he has been taking his insulin as prescribed. Reports he has a follow up appointment with his endocrinologist soon.     Pertinent Labs:   Lab Results   Lab Name Value Date/Time    NA 134 04/23/2019 04:12 PM    BUN 13 04/23/2019 04:12 PM    CR 0.91 04/23/2019 04:12 PM    GLU 230 04/23/2019 04:12 PM    K 4.1 04/23/2019 04:12 PM    PO4 3.4 04/23/2019 04:12 PM    MG 1.6 04/23/2019 04:12 PM    ALB 3.7 04/23/2019 04:12 PM     Last a1c per care everywhere (12/22/2018): 12.9%    Self Monitored Blood Glucose: not currently checking BG at home     Nutrition Focused Physical Exam: Not indicated    Additional nutrition focused physical findings:  Potential signs of inflammation: chronic disease (DM and organ failure)  Digestive Systems: no issues     Anthropometrics:  Ht Readings from Last 1 Encounters:   04/27/19 1.6 m (5\' 3" )     Wt Readings from Last 3 Encounters:   04/27/19 63.2 kg (139 lb 5.3 oz)   06/10/18 61.9 kg (136 lb 7.4 oz)   02/05/17 69.3 kg (152 lb 12.5 oz)   ]  Body mass index is 24.68 kg/m.     Food & Nutrition Related History:    Previously prescribed diet: diabetic, posttransplant food safety guidelines  Previous nutrition education/counseling: post-transplant education with inpatient RD.    Diet Recall:   Meal/Snack pattern: patient reports he feels motivated to make dietary changes-he has already made changes to reduce his candy intake by no longer going down the candy isle. He typically has 2 meals/day, 1 form  [employer] and a frozen burrito or other convenience food. Also regular consumes diet energy drinks and diet soda.     Nutrition Diagnosis     1. Food-and nutrition- related knowledge deficit related to new nutrition adjustments post-transplant as evidenced by patient &/or family unable to verbalize transplant food & water safety guidelines (resolved).   2. Altered nutrition-related laboratory values related to dietary intake as evidenced by  hyperglycemia (as1 12.9%).  NC-2.2  3. No diagnosis of malnutrition at this time.       Nutrition Intervention [RECOMMENDATIONS]     Nutrition Education:     1. Diabetes:    Reviewed dietary changes for the improvement in blood sugar values, included reducing intake of processed foods/limited CHO intake, and increase whole foods    Encouraged patient to start checking home BG and follow-up with endo     Education needs identified: Yes  Barriers to learning assessed: none  Patient verbalizes understanding of teaching instructions: Yes  Patient agreed to recommendations: Yes  Handouts provided:  none    Nutrition Monitoring & Evaluation       1. Level of Nutrition Knowledge: ability to verbalize post transplant  nutrition recommendations   2. Glucose/Endocrine Profile: a1c <7.0%    04/28/2019   Charles Haggislivia Jamon [place], MS, RD  Outpatient Transplant Dietitian  Pager: [phone] Office: [phone]

## 2019-05-12 ENCOUNTER — Other Ambulatory Visit: Payer: Self-pay

## 2019-05-13 ENCOUNTER — Other Ambulatory Visit: Payer: Self-pay

## 2019-06-08 ENCOUNTER — Other Ambulatory Visit: Payer: Self-pay

## 2019-06-09 ENCOUNTER — Other Ambulatory Visit: Payer: Self-pay

## 2019-06-10 ENCOUNTER — Other Ambulatory Visit: Payer: Self-pay

## 2019-06-19 ENCOUNTER — Other Ambulatory Visit: Payer: Self-pay

## 2019-07-03 ENCOUNTER — Other Ambulatory Visit: Payer: Self-pay

## 2019-07-07 ENCOUNTER — Other Ambulatory Visit: Payer: Self-pay

## 2019-07-16 ENCOUNTER — Other Ambulatory Visit: Payer: Self-pay

## 2019-07-17 ENCOUNTER — Other Ambulatory Visit: Payer: Self-pay

## 2019-07-30 ENCOUNTER — Other Ambulatory Visit: Payer: Self-pay

## 2019-07-31 ENCOUNTER — Other Ambulatory Visit: Payer: Self-pay

## 2019-08-03 ENCOUNTER — Other Ambulatory Visit: Payer: Self-pay

## 2019-08-09 ENCOUNTER — Other Ambulatory Visit: Payer: Self-pay

## 2019-08-27 ENCOUNTER — Other Ambulatory Visit: Payer: Self-pay

## 2019-09-04 ENCOUNTER — Other Ambulatory Visit: Payer: Self-pay

## 2019-09-07 ENCOUNTER — Other Ambulatory Visit: Payer: Self-pay

## 2019-09-11 ENCOUNTER — Other Ambulatory Visit: Payer: Self-pay

## 2019-09-29 ENCOUNTER — Other Ambulatory Visit: Payer: Self-pay

## 2019-10-02 ENCOUNTER — Other Ambulatory Visit: Payer: Self-pay

## 2019-10-08 ENCOUNTER — Other Ambulatory Visit: Payer: Self-pay

## 2019-10-27 ENCOUNTER — Other Ambulatory Visit: Payer: Self-pay

## 2019-11-02 ENCOUNTER — Other Ambulatory Visit: Payer: Self-pay

## 2019-11-03 ENCOUNTER — Other Ambulatory Visit: Payer: Self-pay

## 2019-11-04 ENCOUNTER — Other Ambulatory Visit: Payer: Self-pay

## 2019-11-06 ENCOUNTER — Other Ambulatory Visit: Payer: Self-pay

## 2019-11-11 ENCOUNTER — Other Ambulatory Visit: Payer: Self-pay

## 2019-11-25 ENCOUNTER — Other Ambulatory Visit: Payer: Self-pay

## 2019-11-26 ENCOUNTER — Other Ambulatory Visit: Payer: Self-pay

## 2019-12-01 ENCOUNTER — Other Ambulatory Visit: Payer: Self-pay

## 2019-12-15 ENCOUNTER — Other Ambulatory Visit: Payer: Self-pay

## 2019-12-21 ENCOUNTER — Ambulatory Visit: Payer: Medicare Other

## 2020-01-05 ENCOUNTER — Other Ambulatory Visit: Payer: Self-pay | Admitting: Internal Medicine

## 2020-01-05 ENCOUNTER — Other Ambulatory Visit: Payer: Self-pay

## 2020-01-05 DIAGNOSIS — Z940 Kidney transplant status: Secondary | ICD-10-CM

## 2020-01-06 ENCOUNTER — Other Ambulatory Visit: Payer: Self-pay | Admitting: Family Medicine

## 2020-01-07 ENCOUNTER — Other Ambulatory Visit: Payer: Self-pay

## 2020-01-07 ENCOUNTER — Other Ambulatory Visit: Payer: Self-pay | Admitting: Internal Medicine

## 2020-01-07 DIAGNOSIS — Z940 Kidney transplant status: Secondary | ICD-10-CM

## 2020-01-08 ENCOUNTER — Other Ambulatory Visit: Payer: Self-pay

## 2020-01-08 ENCOUNTER — Telehealth: Payer: Self-pay

## 2020-01-08 NOTE — Telephone Encounter (Addendum)
IS refills noted; no recent labs or clinic visits; pt instructed multiple times to establish with PCP and local nephrologist; Called pt; no answer; left the following vm: please call me with contact info of your PCP and local nephrologist; and please ask your local nephrologist to fill anti-rejection medications.

## 2020-01-08 NOTE — Telephone Encounter (Signed)
Incoming call from pt. Pt asked to set up a video visit for the month of April. Reminded the pt to get labs done prior to appointment.     Note: Pt asked if he could switch TNC to [place] Jock.    Pt said he does not need a call back at this time.

## 2020-01-08 NOTE — Telephone Encounter (Signed)
Called pt about refill requests; requested a call back

## 2020-01-12 ENCOUNTER — Other Ambulatory Visit: Payer: Self-pay

## 2020-01-12 MED ORDER — MYCOPHENOLATE MOFETIL 250 MG CAPSULE
750.0000 mg | ORAL_CAPSULE | Freq: Two times a day (BID) | ORAL | 0 refills | Status: DC
Start: 2020-01-12 — End: 2020-02-16
  Filled 2020-01-12: fill #0
  Filled 2020-01-21: qty 180, 30d supply, fill #0

## 2020-01-12 MED ORDER — TACROLIMUS 1 MG CAPSULE, IMMEDIATE-RELEASE
ORAL_CAPSULE | ORAL | 0 refills | Status: DC
Start: 2020-01-12 — End: 2020-02-16
  Filled 2020-01-12: fill #0
  Filled 2020-01-21: qty 240, 30d supply, fill #0

## 2020-01-12 NOTE — Telephone Encounter (Signed)
Patient with no recent lab work or clinic visits requesting refills.  I see he is now scheduled for clinic visit this coming month.    I have put a note in the chart to indicate the need for patient to develop relationship with a local PCP and local nephrologist.

## 2020-01-13 ENCOUNTER — Other Ambulatory Visit: Payer: Self-pay

## 2020-01-13 ENCOUNTER — Other Ambulatory Visit: Payer: Self-pay | Admitting: Internal Medicine

## 2020-01-14 LAB — COMPREHENSIVE METABOLIC PANEL (EXTERNAL LAB)
A/G Ratio_Ext: 2.1 (ref 1.2–2.2)
ALT (SGPT)_Ext: 39 IU/L (ref 0–44)
AST (SGOT)_Ext: 26 IU/L (ref 0–40)
Albumin_Ext: 5 g/dL — ABNORMAL HIGH (ref 3.8–4.9)
Alkaline Phosphatase_Ext: 109 IU/L (ref 39–117)
BUN/Creatinine Ratio_Ext: 21 — ABNORMAL HIGH (ref 9–20)
BUN_Ext: 14 mg/dL (ref 6–24)
Bilirubin, Total_Ext: 0.4 mg/dL (ref 0.0–1.2)
Calcium_Ext: 9.6 mg/dL (ref 8.7–10.2)
Carbon Dioxide, Total_Ext: 24 mmol/L (ref 20–29)
Chloride_Ext: 103 mmol/L (ref 96–106)
Creatinine_Ext: 0.68 mg/dL — ABNORMAL LOW (ref 0.76–1.27)
Globulin, Total_Ext: 2.4 g/dL (ref 1.5–4.5)
Glucose_Ext: 30 mg/dL — CL (ref 65–99)
Potassium_Ext: 3.3 mmol/L — ABNORMAL LOW (ref 3.5–5.2)
Protein, Total_Ext: 7.4 g/dL (ref 6.0–8.5)
Sodium_Ext: 141 mmol/L (ref 134–144)
eGFR If Africn Am_Ext: 127 mL/min/{1.73_m2} (ref >59)
eGFR If NonAfricn Am_Ext: 110 mL/min/{1.73_m2} (ref >59)

## 2020-01-14 LAB — URINALYSIS, COMPLETE (EXTERNAL LAB)
Bilirubin_Ext: NEGATIVE
Glucose_Ext: NEGATIVE
Ketones_Ext: NEGATIVE
Nitrite, Urine_Ext: NEGATIVE
Occult Blood_Ext: NEGATIVE
Protein_Ext: NEGATIVE
Specific Gravity_Ext: <=1.005 — AB (ref 1.005–1.030)
Urobilinogen,Semi-Qn_Ext: 0.2 mg/dL (ref 0.2–1.0)
WBC Esterase_Ext: NEGATIVE
pH_Ext: 7 (ref 5.0–7.5)

## 2020-01-14 LAB — CBC (INCLUDES DIFF/PLT) (EXTERNAL LAB)
Basophils % Auto: 1 %
Basophils Abs Auto: 0.1 10*3/uL (ref 0.0–0.2)
Eosinophils % Auto: 2 %
Eosinophils Abs Auto: 0.2 10*3/uL (ref 0.0–0.4)
Hematocrit: 46.3 % (ref 37.5–51.0)
Hemoglobin: 15.1 g/dL (ref 13.0–17.7)
Immature Grans (Abs)_Ext: 0 10*3/uL (ref 0.0–0.1)
Immature Granulocytes_Ext: 0 %
Lymphocytes % Auto: 36 %
Lymphocytes Abs Auto: 3 10*3/uL (ref 0.7–3.1)
MCH: 30.4 pg (ref 26.6–33.0)
MCHC g/dL: 32.6 g/dL (ref 31.5–35.7)
MCV: 93 fL (ref 79–97)
Monocytes % Auto: 10 %
Monocytes Abs Auto: 0.9 10*3/uL (ref 0.1–0.9)
Neutrophils % Auto: 51 %
Neutrophils Abs Auto: 4.2 10*3/uL (ref 1.4–7.0)
Platelet Count: 191 10*3/uL (ref 150–450)
RDW: 12.5 % (ref 11.6–15.4)
Red Blood Cell Count: 4.96 x10E6/uL (ref 4.14–5.80)
White Blood Cell Count: 8.3 10*3/uL (ref 3.4–10.8)

## 2020-01-14 LAB — MICROSCOPIC EXAM URINE (EXTERNAL LAB)
Bacteria_Ext: NONE SEEN
Epithelial Cells (non renal)_Ext: NONE SEEN /hpf (ref 0–10)

## 2020-01-14 LAB — PROTEIN, TOTAL W/CREAT, RANDOM URINE (EXTERNAL LAB)
Creatinine, Urine_Ext: 11.7 mg/dL
Protein,Total,Urine_Ext: <4 mg/dL

## 2020-01-14 LAB — MAGNESIUM (EXTERNAL LAB): Magnesium_Ext: 1.9 mg/dL (ref 1.6–2.3)

## 2020-01-14 LAB — PHOSPHATE (AS PHOSPHORUS) (EXTERNAL LAB): Phosphorus_Ext: 2.7 mg/dL — ABNORMAL LOW (ref 2.8–4.1)

## 2020-01-15 ENCOUNTER — Other Ambulatory Visit: Payer: Self-pay

## 2020-01-15 LAB — TACROLIMUS BY IMMUNOASSAY (EXTERNAL LAB): Tacrolimus by Immunoassay_Ext: 9.9 ng/mL (ref 2.0–20.0)

## 2020-01-18 ENCOUNTER — Other Ambulatory Visit: Payer: Self-pay

## 2020-01-19 ENCOUNTER — Other Ambulatory Visit: Payer: Self-pay

## 2020-01-19 ENCOUNTER — Telehealth: Payer: Self-pay | Admitting: Internal Medicine

## 2020-01-19 NOTE — Telephone Encounter (Signed)
Received call from patient wanting to go over his recent labs and would like to speak with Casimiro Needle. Patient can be reached at [phone].

## 2020-01-19 NOTE — Telephone Encounter (Signed)
Patient changed his mind after speaking with him that he would try to gain access to his MyChart Account and look at his labs that way.

## 2020-01-21 ENCOUNTER — Other Ambulatory Visit: Payer: Self-pay

## 2020-01-25 ENCOUNTER — Other Ambulatory Visit: Payer: Self-pay

## 2020-02-11 ENCOUNTER — Ambulatory Visit: Payer: Medicare Other | Admitting: Specialist

## 2020-02-16 ENCOUNTER — Other Ambulatory Visit: Payer: Self-pay

## 2020-02-16 ENCOUNTER — Other Ambulatory Visit: Payer: Self-pay | Admitting: Internal Medicine

## 2020-02-16 DIAGNOSIS — Z940 Kidney transplant status: Secondary | ICD-10-CM

## 2020-02-17 ENCOUNTER — Other Ambulatory Visit: Payer: Self-pay

## 2020-02-17 MED ORDER — MYCOPHENOLATE MOFETIL 250 MG CAPSULE
750.0000 mg | ORAL_CAPSULE | Freq: Two times a day (BID) | ORAL | 0 refills | Status: DC
Start: 2020-02-17 — End: 2020-03-15
  Filled 2020-02-17: fill #0
  Filled 2020-02-19: qty 180, 30d supply, fill #0

## 2020-02-17 MED ORDER — TACROLIMUS 1 MG CAPSULE, IMMEDIATE-RELEASE
ORAL_CAPSULE | ORAL | 0 refills | Status: DC
Start: 2020-02-17 — End: 2020-03-15
  Filled 2020-02-17: fill #0
  Filled 2020-02-19: qty 240, 30d supply, fill #0

## 2020-02-19 ENCOUNTER — Other Ambulatory Visit: Payer: Self-pay

## 2020-02-22 ENCOUNTER — Other Ambulatory Visit: Payer: Self-pay

## 2020-02-25 ENCOUNTER — Other Ambulatory Visit: Payer: Self-pay | Admitting: Internal Medicine

## 2020-02-26 LAB — COMPREHENSIVE METABOLIC PANEL (EXTERNAL LAB)
A/G Ratio_Ext: 1.5 (ref 1.2–2.2)
ALT (SGPT)_Ext: 22 IU/L (ref 0–44)
AST (SGOT)_Ext: 20 IU/L (ref 0–40)
Albumin_Ext: 4 g/dL (ref 3.8–4.9)
Alkaline Phosphatase_Ext: 106 IU/L (ref 39–117)
BUN/Creatinine Ratio_Ext: 23 — ABNORMAL HIGH (ref 9–20)
BUN_Ext: 15 mg/dL (ref 6–24)
Bilirubin, Total_Ext: 0.6 mg/dL (ref 0.0–1.2)
Calcium_Ext: 9 mg/dL (ref 8.7–10.2)
Carbon Dioxide, Total_Ext: 27 mmol/L (ref 20–29)
Chloride_Ext: 98 mmol/L (ref 96–106)
Creatinine_Ext: 0.66 mg/dL — ABNORMAL LOW (ref 0.76–1.27)
Globulin, Total_Ext: 2.6 g/dL (ref 1.5–4.5)
Glucose_Ext: 124 mg/dL — ABNORMAL HIGH (ref 65–99)
Potassium_Ext: 4 mmol/L (ref 3.5–5.2)
Protein, Total_Ext: 6.6 g/dL (ref 6.0–8.5)
Sodium_Ext: 138 mmol/L (ref 134–144)
eGFR If Africn Am_Ext: 129 mL/min/{1.73_m2} (ref >59)
eGFR If NonAfricn Am_Ext: 111 mL/min/{1.73_m2} (ref >59)

## 2020-02-26 LAB — URINALYSIS, COMPLETE (EXTERNAL LAB)
Bilirubin_Ext: NEGATIVE
Ketones_Ext: NEGATIVE
Nitrite, Urine_Ext: NEGATIVE
Occult Blood_Ext: NEGATIVE
Protein_Ext: NEGATIVE
Specific Gravity_Ext: 1.015 (ref 1.005–1.030)
Urobilinogen,Semi-Qn_Ext: 0.2 mg/dL (ref 0.2–1.0)
WBC Esterase_Ext: NEGATIVE
pH_Ext: 6.5 (ref 5.0–7.5)

## 2020-02-26 LAB — CBC (INCLUDES DIFF/PLT) (EXTERNAL LAB)
Basophils % Auto: 1 %
Basophils Abs Auto: 0 10*3/uL (ref 0.0–0.2)
Eosinophils % Auto: 3 %
Eosinophils Abs Auto: 0.1 10*3/uL (ref 0.0–0.4)
Hematocrit: 41.9 % (ref 37.5–51.0)
Hemoglobin: 14.1 g/dL (ref 13.0–17.7)
Immature Grans (Abs)_Ext: 0 10*3/uL (ref 0.0–0.1)
Immature Granulocytes_Ext: 1 %
Lymphocytes % Auto: 22 %
Lymphocytes Abs Auto: 1.2 10*3/uL (ref 0.7–3.1)
MCH: 30.6 pg (ref 26.6–33.0)
MCHC g/dL: 33.7 g/dL (ref 31.5–35.7)
MCV: 91 fL (ref 79–97)
Monocytes % Auto: 11 %
Monocytes Abs Auto: 0.6 10*3/uL (ref 0.1–0.9)
Neutrophils % Auto: 62 %
Neutrophils Abs Auto: 3.5 10*3/uL (ref 1.4–7.0)
Platelet Count: 213 10*3/uL (ref 150–450)
RDW: 12.8 % (ref 11.6–15.4)
Red Blood Cell Count: 4.61 x10E6/uL (ref 4.14–5.80)
White Blood Cell Count: 5.5 10*3/uL (ref 3.4–10.8)

## 2020-02-26 LAB — MICROSCOPIC EXAM URINE (EXTERNAL LAB)
Bacteria_Ext: NONE SEEN
Casts_Ext: NONE SEEN /lpf
Epithelial Cells (non renal)_Ext: NONE SEEN /hpf (ref 0–10)
RBC_Ext: NONE SEEN /hpf (ref 0–2)
WBC_Ext: NONE SEEN /hpf (ref 0–5)

## 2020-02-26 LAB — PROTEIN, TOTAL W/CREAT, RANDOM URINE (EXTERNAL LAB)
Creatinine, Urine_Ext: 60.5 mg/dL
Protein,Total,Urine_Ext: 5.5 mg/dL
Protein/Creat Ratio_Ext: 91 mg/g creat (ref 0–200)

## 2020-02-26 LAB — MAGNESIUM (EXTERNAL LAB): Magnesium_Ext: 1.9 mg/dL (ref 1.6–2.3)

## 2020-02-26 LAB — PHOSPHATE (AS PHOSPHORUS) (EXTERNAL LAB): Phosphorus_Ext: 2.9 mg/dL (ref 2.8–4.1)

## 2020-02-29 ENCOUNTER — Ambulatory Visit: Payer: Medicare Other | Admitting: Internal Medicine

## 2020-02-29 ENCOUNTER — Telehealth: Payer: Self-pay

## 2020-02-29 LAB — TACROLIMUS BY IMMUNOASSAY (EXTERNAL LAB): Tacrolimus by Immunoassay_Ext: 5.7 ng/mL (ref 2.0–20.0)

## 2020-02-29 NOTE — Telephone Encounter (Signed)
I left voicemail for patient.  I suggested that he call back and schedule visit so that he at least he has a saved date and then if he wishes to be seen sooner he can continue to call for an opening.    Patient was a NO SHOW on 12/21/2019 and then cancelled visit for 4/22 on 3/19 and then cancelled this afternoons visit today.

## 2020-02-29 NOTE — Telephone Encounter (Signed)
Incoming call from pt; 3 ID's used; pt asked to cancel today's routine follow up appt w/ Dr. Renaldo Reel. Pt has not been seen in a while. In order to continue refilling medications, he MUST be seen.     I offered next available w/ Dr. Renaldo Reel for the end of June, and pt said he will keep "keep calling back once or twice a week to try to get in sooner." Did not accept my offer for end of June.

## 2020-03-04 ENCOUNTER — Other Ambulatory Visit: Payer: Self-pay

## 2020-03-15 ENCOUNTER — Other Ambulatory Visit: Payer: Self-pay

## 2020-03-15 ENCOUNTER — Other Ambulatory Visit: Payer: Self-pay | Admitting: Internal Medicine

## 2020-03-15 DIAGNOSIS — Z940 Kidney transplant status: Secondary | ICD-10-CM

## 2020-03-16 ENCOUNTER — Other Ambulatory Visit: Payer: Self-pay

## 2020-03-18 ENCOUNTER — Other Ambulatory Visit: Payer: Self-pay

## 2020-03-18 MED ORDER — MYCOPHENOLATE MOFETIL 250 MG CAPSULE
750.0000 mg | ORAL_CAPSULE | Freq: Two times a day (BID) | ORAL | 0 refills | Status: DC
Start: 2020-03-18 — End: 2020-04-15
  Filled 2020-03-18: qty 180, 30d supply, fill #0

## 2020-03-18 MED ORDER — TACROLIMUS 1 MG CAPSULE, IMMEDIATE-RELEASE
ORAL_CAPSULE | ORAL | 0 refills | Status: DC
Start: 2020-03-18 — End: 2020-04-15
  Filled 2020-03-18: qty 240, 30d supply, fill #0

## 2020-03-23 ENCOUNTER — Other Ambulatory Visit: Payer: Self-pay

## 2020-03-31 ENCOUNTER — Other Ambulatory Visit: Payer: Self-pay

## 2020-04-15 ENCOUNTER — Other Ambulatory Visit: Payer: Self-pay

## 2020-04-15 ENCOUNTER — Other Ambulatory Visit: Payer: Self-pay | Admitting: Internal Medicine

## 2020-04-15 DIAGNOSIS — Z940 Kidney transplant status: Secondary | ICD-10-CM

## 2020-04-18 ENCOUNTER — Other Ambulatory Visit: Payer: Self-pay

## 2020-04-18 ENCOUNTER — Ambulatory Visit: Payer: Medicare Other | Admitting: Internal Medicine

## 2020-04-19 ENCOUNTER — Other Ambulatory Visit: Payer: Self-pay

## 2020-04-19 MED ORDER — TACROLIMUS 1 MG CAPSULE, IMMEDIATE-RELEASE
ORAL_CAPSULE | ORAL | 0 refills | Status: DC
Start: 2020-04-19 — End: 2020-05-16
  Filled 2020-04-19: qty 240, 30d supply, fill #0

## 2020-04-19 MED ORDER — MYCOPHENOLATE MOFETIL 250 MG CAPSULE
750.0000 mg | ORAL_CAPSULE | Freq: Two times a day (BID) | ORAL | 0 refills | Status: DC
Start: 2020-04-19 — End: 2020-05-16
  Filled 2020-04-19: qty 180, 30d supply, fill #0

## 2020-04-20 ENCOUNTER — Other Ambulatory Visit: Payer: Self-pay

## 2020-05-02 ENCOUNTER — Ambulatory Visit: Payer: Medicare Other | Admitting: Internal Medicine

## 2020-05-09 ENCOUNTER — Other Ambulatory Visit: Payer: Self-pay

## 2020-05-13 ENCOUNTER — Other Ambulatory Visit: Payer: Self-pay | Admitting: Internal Medicine

## 2020-05-14 LAB — COMPREHENSIVE METABOLIC PANEL (EXTERNAL LAB)
A/G Ratio_Ext: 1.8 (ref 1.2–2.2)
ALT (SGPT)_Ext: 16 IU/L (ref 0–44)
AST (SGOT)_Ext: 18 IU/L (ref 0–40)
Albumin_Ext: 4.4 g/dL (ref 3.8–4.9)
Alkaline Phosphatase_Ext: 141 IU/L — ABNORMAL HIGH (ref 48–121)
BUN/Creatinine Ratio_Ext: 14 (ref 9–20)
BUN_Ext: 10 mg/dL (ref 6–24)
Bilirubin, Total_Ext: 0.4 mg/dL (ref 0.0–1.2)
Calcium_Ext: 9.8 mg/dL (ref 8.7–10.2)
Carbon Dioxide, Total_Ext: 26 mmol/L (ref 20–29)
Chloride_Ext: 97 mmol/L (ref 96–106)
Creatinine_Ext: 0.71 mg/dL — ABNORMAL LOW (ref 0.76–1.27)
Globulin, Total_Ext: 2.5 g/dL (ref 1.5–4.5)
Glucose_Ext: 160 mg/dL — ABNORMAL HIGH (ref 65–99)
Potassium_Ext: 4 mmol/L (ref 3.5–5.2)
Protein, Total_Ext: 6.9 g/dL (ref 6.0–8.5)
Sodium_Ext: 137 mmol/L (ref 134–144)
eGFR If Africn Am_Ext: 125 mL/min/{1.73_m2} (ref >59)
eGFR If NonAfricn Am_Ext: 108 mL/min/{1.73_m2} (ref >59)

## 2020-05-14 LAB — URINALYSIS, COMPLETE (EXTERNAL LAB)
Bilirubin_Ext: NEGATIVE
Ketones_Ext: NEGATIVE
Nitrite, Urine_Ext: NEGATIVE
Occult Blood_Ext: NEGATIVE
Protein_Ext: NEGATIVE
Specific Gravity_Ext: 1.017 (ref 1.005–1.030)
Urobilinogen,Semi-Qn_Ext: 0.2 mg/dL (ref 0.2–1.0)
WBC Esterase_Ext: NEGATIVE
pH_Ext: 6 (ref 5.0–7.5)

## 2020-05-14 LAB — CBC (INCLUDES DIFF/PLT) (EXTERNAL LAB)
Basophils % Auto: 1 %
Basophils Abs Auto: 0 10*3/uL (ref 0.0–0.2)
Eosinophils % Auto: 4 %
Eosinophils Abs Auto: 0.2 10*3/uL (ref 0.0–0.4)
Hematocrit: 45.5 % (ref 37.5–51.0)
Hemoglobin: 14.9 g/dL (ref 13.0–17.7)
Immature Grans (Abs)_Ext: 0 10*3/uL (ref 0.0–0.1)
Immature Granulocytes_Ext: 0 %
Lymphocytes % Auto: 23 %
Lymphocytes Abs Auto: 1.1 10*3/uL (ref 0.7–3.1)
MCH: 30 pg (ref 26.6–33.0)
MCHC g/dL: 32.7 g/dL (ref 31.5–35.7)
MCV: 92 fL (ref 79–97)
Monocytes % Auto: 10 %
Monocytes Abs Auto: 0.5 10*3/uL (ref 0.1–0.9)
Neutrophils % Auto: 62 %
Neutrophils Abs Auto: 3.1 10*3/uL (ref 1.4–7.0)
Platelet Count: 207 10*3/uL (ref 150–450)
RDW: 12.6 % (ref 11.6–15.4)
Red Blood Cell Count: 4.97 x10E6/uL (ref 4.14–5.80)
White Blood Cell Count: 4.9 10*3/uL (ref 3.4–10.8)

## 2020-05-14 LAB — MICROSCOPIC EXAM URINE (EXTERNAL LAB)
Bacteria_Ext: NONE SEEN
Casts_Ext: NONE SEEN /lpf
Epithelial Cells (non renal)_Ext: NONE SEEN /hpf (ref 0–10)
RBC_Ext: NONE SEEN /hpf (ref 0–2)
WBC_Ext: NONE SEEN /hpf (ref 0–5)

## 2020-05-14 LAB — PHOSPHATE (AS PHOSPHORUS) (EXTERNAL LAB): Phosphorus_Ext: 3.8 mg/dL (ref 2.8–4.1)

## 2020-05-14 LAB — PROTEIN, TOTAL W/CREAT, RANDOM URINE (EXTERNAL LAB)
Creatinine, Urine_Ext: 93.6 mg/dL
Protein,Total,Urine_Ext: 12.2 mg/dL
Protein/Creat Ratio_Ext: 130 mg/g creat (ref 0–200)

## 2020-05-14 LAB — MAGNESIUM (EXTERNAL LAB): Magnesium_Ext: 1.7 mg/dL (ref 1.6–2.3)

## 2020-05-16 ENCOUNTER — Other Ambulatory Visit: Payer: Self-pay | Admitting: Internal Medicine

## 2020-05-16 ENCOUNTER — Other Ambulatory Visit: Payer: Self-pay

## 2020-05-16 ENCOUNTER — Ambulatory Visit: Payer: Medicare Other | Admitting: Internal Medicine

## 2020-05-16 DIAGNOSIS — Z940 Kidney transplant status: Secondary | ICD-10-CM

## 2020-05-16 LAB — TACROLIMUS BY IMMUNOASSAY (EXTERNAL LAB): Tacrolimus by Immunoassay_Ext: 9.7 ng/mL (ref 2.0–20.0)

## 2020-05-17 NOTE — Progress Notes (Unsigned)
Patient was a ' no show' for video visit.    Last transplant visit was 04/27/2019  Next scheduled visit was in March 2021 and was a no show. Following that there were four cancellations and yesterdays no show for a video visit.    Lab follow up has been q2 months since March 2021, but in the years prior was at times yearly     The last scheduled visits were meant in part to ensure patient has local nephrology follow up.    Compliance letter to be mailed to patient.   Certified- [phone]

## 2020-05-18 ENCOUNTER — Other Ambulatory Visit: Payer: Self-pay

## 2020-05-18 MED ORDER — MYCOPHENOLATE MOFETIL 250 MG CAPSULE
750.0000 mg | ORAL_CAPSULE | Freq: Two times a day (BID) | ORAL | 2 refills | Status: DC
Start: 2020-05-18 — End: 2020-08-16
  Filled 2020-05-18: qty 180, 30d supply, fill #0
  Filled 2020-06-16: qty 180, 30d supply, fill #1
  Filled 2020-07-22 – 2020-07-28 (×2): qty 180, 30d supply, fill #2

## 2020-05-18 MED ORDER — TACROLIMUS 1 MG CAPSULE, IMMEDIATE-RELEASE
ORAL_CAPSULE | ORAL | 2 refills | Status: DC
Start: 2020-05-18 — End: 2020-08-16
  Filled 2020-05-18: qty 240, 30d supply, fill #0
  Filled 2020-06-16: qty 240, 30d supply, fill #1
  Filled 2020-07-22 – 2020-07-28 (×2): qty 240, 30d supply, fill #2

## 2020-05-18 NOTE — Telephone Encounter (Signed)
IS meds filled x3 months.  Message left no patients voicemail to call back to transplant clinic.    At this time he has multple No shows and cancellations. Per Dr.Huang, if patient is unable to maintain proper follow up we will bot be able to continue to orders labs or meds.      Requested a callback t determin if patient has pursues local nephrologist. (This has been requested for > 1 year in previous encoutners).

## 2020-05-23 ENCOUNTER — Other Ambulatory Visit: Payer: Self-pay

## 2020-05-24 ENCOUNTER — Other Ambulatory Visit: Payer: Self-pay

## 2020-05-25 ENCOUNTER — Other Ambulatory Visit: Payer: Self-pay

## 2020-06-07 ENCOUNTER — Other Ambulatory Visit: Payer: Self-pay

## 2020-06-16 ENCOUNTER — Other Ambulatory Visit: Payer: Self-pay

## 2020-06-20 ENCOUNTER — Other Ambulatory Visit: Payer: Self-pay

## 2020-06-23 ENCOUNTER — Other Ambulatory Visit: Payer: Self-pay

## 2020-06-28 ENCOUNTER — Other Ambulatory Visit: Payer: Self-pay

## 2020-06-29 ENCOUNTER — Other Ambulatory Visit: Payer: Self-pay

## 2020-07-22 ENCOUNTER — Other Ambulatory Visit: Payer: Self-pay

## 2020-07-28 ENCOUNTER — Other Ambulatory Visit: Payer: Self-pay

## 2020-08-16 ENCOUNTER — Other Ambulatory Visit: Payer: Self-pay

## 2020-08-16 ENCOUNTER — Other Ambulatory Visit: Payer: Self-pay | Admitting: Internal Medicine

## 2020-08-16 DIAGNOSIS — Z940 Kidney transplant status: Secondary | ICD-10-CM

## 2020-08-17 ENCOUNTER — Other Ambulatory Visit: Payer: Self-pay

## 2020-08-17 MED ORDER — MYCOPHENOLATE MOFETIL 250 MG CAPSULE
750.0000 mg | ORAL_CAPSULE | Freq: Two times a day (BID) | ORAL | 0 refills | Status: DC
Start: 2020-08-17 — End: 2020-12-19
  Filled 2020-08-17 – 2020-08-30 (×4): qty 180, 30d supply, fill #0

## 2020-08-17 MED ORDER — TACROLIMUS 1 MG CAPSULE, IMMEDIATE-RELEASE
ORAL_CAPSULE | ORAL | 0 refills | Status: DC
Start: 2020-08-17 — End: 2020-12-19
  Filled 2020-08-17: qty 240, fill #0
  Filled 2020-08-22 – 2020-08-30 (×3): qty 240, 30d supply, fill #0

## 2020-08-17 NOTE — Telephone Encounter (Signed)
AA-  Please call to schedule patient. He can not continue to get medications renewed by transplant if he is unable to keep visits.    Looking back, since last visit he has had multiple cancellations and no shows.    Certified letter may need to be sent; letter sent electonically via My Chart as well.

## 2020-08-22 ENCOUNTER — Other Ambulatory Visit: Payer: Self-pay

## 2020-08-25 ENCOUNTER — Other Ambulatory Visit: Payer: Self-pay

## 2020-08-30 ENCOUNTER — Other Ambulatory Visit: Payer: Self-pay

## 2020-09-02 ENCOUNTER — Other Ambulatory Visit: Payer: Self-pay

## 2020-09-22 ENCOUNTER — Other Ambulatory Visit: Payer: Self-pay | Admitting: Internal Medicine

## 2020-09-22 ENCOUNTER — Other Ambulatory Visit: Payer: Self-pay

## 2020-09-22 DIAGNOSIS — Z940 Kidney transplant status: Secondary | ICD-10-CM

## 2020-09-23 ENCOUNTER — Other Ambulatory Visit: Payer: Self-pay

## 2020-09-28 ENCOUNTER — Other Ambulatory Visit: Payer: Self-pay

## 2020-09-28 ENCOUNTER — Other Ambulatory Visit: Payer: Self-pay | Admitting: Internal Medicine

## 2020-09-29 LAB — COMPREHENSIVE METABOLIC PANEL (EXTERNAL LAB)
A/G Ratio_Ext: 1.9 (ref 1.2–2.2)
ALT (SGPT)_Ext: 17 IU/L (ref 0–44)
AST (SGOT)_Ext: 20 IU/L (ref 0–40)
Albumin_Ext: 4.3 g/dL (ref 3.8–4.9)
Alkaline Phosphatase_Ext: 118 IU/L (ref 44–121)
BUN/Creatinine Ratio_Ext: 20 (ref 9–20)
BUN_Ext: 14 mg/dL (ref 6–24)
Bilirubin, Total_Ext: 0.3 mg/dL (ref 0.0–1.2)
Calcium_Ext: 9.6 mg/dL (ref 8.7–10.2)
Carbon Dioxide, Total_Ext: 27 mmol/L (ref 20–29)
Chloride_Ext: 99 mmol/L (ref 96–106)
Creatinine_Ext: 0.7 mg/dL — ABNORMAL LOW (ref 0.76–1.27)
Globulin, Total_Ext: 2.3 g/dL (ref 1.5–4.5)
Glucose_Ext: 258 mg/dL — ABNORMAL HIGH (ref 65–99)
Potassium_Ext: 4 mmol/L (ref 3.5–5.2)
Protein, Total_Ext: 6.6 g/dL (ref 6.0–8.5)
Sodium_Ext: 138 mmol/L (ref 134–144)
eGFR If Africn Am_Ext: 125 mL/min/{1.73_m2} (ref >59)
eGFR If NonAfricn Am_Ext: 108 mL/min/{1.73_m2} (ref >59)

## 2020-09-29 LAB — MICROSCOPIC EXAM URINE (EXTERNAL LAB)
Bacteria_Ext: NONE SEEN
Casts_Ext: NONE SEEN /lpf
Epithelial Cells (non renal)_Ext: NONE SEEN /hpf (ref 0–10)
RBC_Ext: NONE SEEN /hpf (ref 0–2)
WBC_Ext: NONE SEEN /hpf (ref 0–5)

## 2020-09-29 LAB — URINALYSIS, COMPLETE (EXTERNAL LAB)
Bilirubin_Ext: NEGATIVE
Ketones_Ext: NEGATIVE
Nitrite, Urine_Ext: NEGATIVE
Occult Blood_Ext: NEGATIVE
Protein_Ext: NEGATIVE
Specific Gravity_Ext: >=1.03 — AB (ref 1.005–1.030)
Urobilinogen,Semi-Qn_Ext: 0.2 mg/dL (ref 0.2–1.0)
WBC Esterase_Ext: NEGATIVE
pH_Ext: 6 (ref 5.0–7.5)

## 2020-09-29 LAB — CBC (INCLUDES DIFF/PLT) (EXTERNAL LAB)
Basophils % Auto: 0 %
Basophils Abs Auto: 0 10*3/uL (ref 0.0–0.2)
Eosinophils % Auto: 3 %
Eosinophils Abs Auto: 0.2 10*3/uL (ref 0.0–0.4)
Hematocrit: 43.8 % (ref 37.5–51.0)
Hemoglobin: 14.2 g/dL (ref 13.0–17.7)
Immature Grans (Abs)_Ext: 0 10*3/uL (ref 0.0–0.1)
Immature Granulocytes_Ext: 0 %
Lymphocytes % Auto: 32 %
Lymphocytes Abs Auto: 1.7 10*3/uL (ref 0.7–3.1)
MCH: 29.8 pg (ref 26.6–33.0)
MCHC g/dL: 32.4 g/dL (ref 31.5–35.7)
MCV: 92 fL (ref 79–97)
Monocytes % Auto: 10 %
Monocytes Abs Auto: 0.6 10*3/uL (ref 0.1–0.9)
Neutrophils % Auto: 55 %
Neutrophils Abs Auto: 2.9 10*3/uL (ref 1.4–7.0)
Platelet Count: 166 10*3/uL (ref 150–450)
RDW: 12.6 % (ref 11.6–15.4)
Red Blood Cell Count: 4.76 x10E6/uL (ref 4.14–5.80)
White Blood Cell Count: 5.4 10*3/uL (ref 3.4–10.8)

## 2020-09-29 LAB — PHOSPHATE (AS PHOSPHORUS) (EXTERNAL LAB): Phosphorus_Ext: 2.7 mg/dL — ABNORMAL LOW (ref 2.8–4.1)

## 2020-09-29 LAB — MAGNESIUM (EXTERNAL LAB): Magnesium_Ext: 1.8 mg/dL (ref 1.6–2.3)

## 2020-09-29 LAB — PROTEIN, TOTAL W/CREAT, RANDOM URINE (EXTERNAL LAB)
Creatinine, Urine_Ext: 111.8 mg/dL
Protein,Total,Urine_Ext: 9.9 mg/dL
Protein/Creat Ratio_Ext: 89 mg/g creat (ref 0–200)

## 2020-09-30 LAB — TACROLIMUS BY IMMUNOASSAY (EXTERNAL LAB): Tacrolimus by Immunoassay_Ext: 9.3 ng/mL (ref 2.0–20.0)

## 2020-10-03 ENCOUNTER — Other Ambulatory Visit: Payer: Self-pay

## 2020-10-04 ENCOUNTER — Other Ambulatory Visit: Payer: Self-pay

## 2020-10-07 ENCOUNTER — Other Ambulatory Visit: Payer: Self-pay

## 2020-10-12 ENCOUNTER — Other Ambulatory Visit: Payer: Self-pay

## 2020-10-25 ENCOUNTER — Other Ambulatory Visit: Payer: Self-pay

## 2020-10-26 ENCOUNTER — Telehealth: Payer: Self-pay | Admitting: Specialist

## 2020-10-26 ENCOUNTER — Other Ambulatory Visit: Payer: Self-pay

## 2020-10-26 NOTE — Telephone Encounter (Signed)
Received call from patient, 3 IDs used. Patient calling because he has issues with being able to hold clinic visits. Patient said he cannot do the video visit, too challenging. Patient also says he is afraid to come in person because of COVID. He is requesting a telephone appointment due to these issues and says he got labs last month and getting them again next week. Patient aware that refills are requiring to either be seen or have communication with provider. Please return call to patient at [phone].

## 2020-10-26 NOTE — Telephone Encounter (Signed)
Fwd to MD to determine if appropriate.  Patient with history or recurrent cancellations/DNKAs.    Last renewals indicated "there will be no more refills until a clinic visit has been completed". That was back on October 2021 and this is the first documented attempt of him calling transplant.

## 2020-10-31 NOTE — Telephone Encounter (Signed)
With the COVID surge, i'm willing to give him a little longer.    He should come see [place] in clinic in person in February.    Would also encourage him to identify a local nephrologist so we can eventually transition his care    Thanks  Minerva Areola

## 2020-11-14 ENCOUNTER — Telehealth: Payer: Self-pay

## 2020-11-14 NOTE — Telephone Encounter (Signed)
Voicemail left for patien with request he call back to schedule a clinic visit.  Emphasis on need for a clinic visit is he remains unable to do to a 'video visit'.   I've also requested an update regarding local nephrologist and it that has been arranged. Local MD follow up is needed for care of patients that are not seen in person; video only.    By now there are multiple entries showing when patient has been unable ot maintain visits or has had last minute cancellations.

## 2020-11-15 ENCOUNTER — Other Ambulatory Visit: Payer: Self-pay

## 2020-11-18 ENCOUNTER — Other Ambulatory Visit: Payer: Self-pay

## 2020-11-21 ENCOUNTER — Telehealth: Payer: Self-pay

## 2020-11-21 ENCOUNTER — Other Ambulatory Visit: Payer: Self-pay

## 2020-11-21 NOTE — Telephone Encounter (Signed)
Patient called and stated that he would like to touch bases with RN coordinator. [phone]

## 2020-11-22 ENCOUNTER — Other Ambulatory Visit: Payer: Self-pay | Admitting: Internal Medicine

## 2020-11-23 LAB — URINALYSIS, COMPLETE (EXTERNAL LAB)
Bilirubin_Ext: NEGATIVE
Ketones_Ext: NEGATIVE
Nitrite, Urine_Ext: NEGATIVE
Occult Blood_Ext: NEGATIVE
Protein_Ext: NEGATIVE
Specific Gravity_Ext: <=1.005 — AB (ref 1.005–1.030)
Urobilinogen,Semi-Qn_Ext: 0.2 mg/dL (ref 0.2–1.0)
WBC Esterase_Ext: NEGATIVE
pH_Ext: 6.5 (ref 5.0–7.5)

## 2020-11-23 LAB — MICROSCOPIC EXAM URINE (EXTERNAL LAB)
Bacteria_Ext: NONE SEEN
Casts_Ext: NONE SEEN /lpf
Epithelial Cells (non renal)_Ext: NONE SEEN /hpf (ref 0–10)
RBC_Ext: NONE SEEN /hpf (ref 0–2)
WBC_Ext: NONE SEEN /hpf (ref 0–5)

## 2020-11-23 LAB — CBC (INCLUDES DIFF/PLT) (EXTERNAL LAB)
Basophils % Auto: 1 %
Basophils Abs Auto: 0.1 10*3/uL (ref 0.0–0.2)
Eosinophils % Auto: 2 %
Eosinophils Abs Auto: 0.2 10*3/uL (ref 0.0–0.4)
Hematocrit: 46.4 % (ref 37.5–51.0)
Hemoglobin: 15 g/dL (ref 13.0–17.7)
Immature Grans (Abs)_Ext: 0 10*3/uL (ref 0.0–0.1)
Immature Granulocytes_Ext: 0 %
Lymphocytes % Auto: 35 %
Lymphocytes Abs Auto: 2.2 10*3/uL (ref 0.7–3.1)
MCH: 28.8 pg (ref 26.6–33.0)
MCHC g/dL: 32.3 g/dL (ref 31.5–35.7)
MCV: 89 fL (ref 79–97)
Monocytes % Auto: 10 %
Monocytes Abs Auto: 0.6 10*3/uL (ref 0.1–0.9)
Neutrophils % Auto: 52 %
Neutrophils Abs Auto: 3.1 10*3/uL (ref 1.4–7.0)
Platelet Count: 215 10*3/uL (ref 150–450)
RDW: 12.7 % (ref 11.6–15.4)
Red Blood Cell Count: 5.21 x10E6/uL (ref 4.14–5.80)
White Blood Cell Count: 6.2 10*3/uL (ref 3.4–10.8)

## 2020-11-23 LAB — COMPREHENSIVE METABOLIC PANEL (EXTERNAL LAB)
A/G Ratio_Ext: 1.8 (ref 1.2–2.2)
ALT (SGPT)_Ext: 23 IU/L (ref 0–44)
AST (SGOT)_Ext: 18 IU/L (ref 0–40)
Albumin_Ext: 4.6 g/dL (ref 3.8–4.9)
Alkaline Phosphatase_Ext: 105 IU/L (ref 44–121)
BUN/Creatinine Ratio_Ext: 14 (ref 9–20)
BUN_Ext: 10 mg/dL (ref 6–24)
Bilirubin, Total_Ext: 0.5 mg/dL (ref 0.0–1.2)
Calcium_Ext: 9.3 mg/dL (ref 8.7–10.2)
Carbon Dioxide, Total_Ext: 22 mmol/L (ref 20–29)
Chloride_Ext: 101 mmol/L (ref 96–106)
Creatinine_Ext: 0.74 mg/dL — ABNORMAL LOW (ref 0.76–1.27)
Globulin, Total_Ext: 2.6 g/dL (ref 1.5–4.5)
Glucose_Ext: 70 mg/dL (ref 65–99)
Potassium_Ext: 3.6 mmol/L (ref 3.5–5.2)
Protein, Total_Ext: 7.2 g/dL (ref 6.0–8.5)
Sodium_Ext: 143 mmol/L (ref 134–144)
eGFR If Africn Am_Ext: 122 mL/min/{1.73_m2} (ref >59)
eGFR If NonAfricn Am_Ext: 105 mL/min/{1.73_m2} (ref >59)

## 2020-11-23 LAB — PROTEIN, TOTAL W/CREAT, RANDOM URINE (EXTERNAL LAB)
Creatinine, Urine_Ext: 10 mg/dL
Protein,Total,Urine_Ext: <4 mg/dL

## 2020-11-23 LAB — MAGNESIUM (EXTERNAL LAB): Magnesium_Ext: 1.9 mg/dL (ref 1.6–2.3)

## 2020-11-23 LAB — PHOSPHATE (AS PHOSPHORUS) (EXTERNAL LAB): Phosphorus_Ext: 3 mg/dL (ref 2.8–4.1)

## 2020-11-24 LAB — TACROLIMUS BY IMMUNOASSAY (EXTERNAL LAB): Tacrolimus by Immunoassay_Ext: 7.4 ng/mL (ref 2.0–20.0)

## 2020-12-14 ENCOUNTER — Other Ambulatory Visit: Payer: Self-pay

## 2020-12-19 ENCOUNTER — Ambulatory Visit: Payer: Medicare Other | Attending: Internal Medicine | Admitting: Internal Medicine

## 2020-12-19 ENCOUNTER — Other Ambulatory Visit: Payer: Self-pay

## 2020-12-19 DIAGNOSIS — Z5181 Encounter for therapeutic drug level monitoring: Secondary | ICD-10-CM | POA: Insufficient documentation

## 2020-12-19 DIAGNOSIS — E119 Type 2 diabetes mellitus without complications: Secondary | ICD-10-CM | POA: Insufficient documentation

## 2020-12-19 DIAGNOSIS — Z79899 Other long term (current) drug therapy: Secondary | ICD-10-CM | POA: Insufficient documentation

## 2020-12-19 DIAGNOSIS — Z4822 Encounter for aftercare following kidney transplant: Secondary | ICD-10-CM | POA: Insufficient documentation

## 2020-12-19 DIAGNOSIS — R197 Diarrhea, unspecified: Secondary | ICD-10-CM | POA: Insufficient documentation

## 2020-12-19 DIAGNOSIS — Z940 Kidney transplant status: Secondary | ICD-10-CM | POA: Insufficient documentation

## 2020-12-19 MED ORDER — TACROLIMUS 1 MG CAPSULE, IMMEDIATE-RELEASE
ORAL_CAPSULE | ORAL | 11 refills | Status: DC
Start: 2020-12-19 — End: 2021-12-15
  Filled 2020-12-20: qty 240, 30d supply, fill #0
  Filled 2021-01-20: qty 240, 30d supply, fill #1
  Filled 2021-02-20: qty 240, 30d supply, fill #2
  Filled 2021-03-22: qty 240, 30d supply, fill #3
  Filled 2021-04-18: qty 240, 30d supply, fill #4
  Filled 2021-05-16: qty 240, 30d supply, fill #5
  Filled 2021-06-15: qty 240, 30d supply, fill #6
  Filled 2021-07-13 – 2021-07-31 (×2): qty 240, 30d supply, fill #7
  Filled 2021-08-24: qty 240, 30d supply, fill #8
  Filled 2021-09-20: qty 240, 30d supply, fill #9
  Filled 2021-10-10 – 2021-10-24 (×2): qty 240, 30d supply, fill #10
  Filled 2021-11-22: qty 240, 30d supply, fill #11

## 2020-12-19 MED ORDER — MYCOPHENOLATE MOFETIL 250 MG CAPSULE
750.0000 mg | ORAL_CAPSULE | Freq: Two times a day (BID) | ORAL | 1 refills | Status: DC
Start: 2020-12-19 — End: 2020-12-29
  Filled 2020-12-20: qty 180, 30d supply, fill #0

## 2020-12-19 MED ORDER — MYCOPHENOLATE MOFETIL 250 MG CAPSULE
750.0000 mg | ORAL_CAPSULE | Freq: Two times a day (BID) | ORAL | 0 refills | Status: DC
Start: 2020-12-19 — End: 2020-12-19

## 2020-12-19 NOTE — Nursing Note (Signed)
Pt identified by 2 identifiers, name and date of birth. Vital signs taken, pain level and allergies verified.     Helen Cuff MA

## 2020-12-19 NOTE — Progress Notes (Signed)
TRANSPLANT NEPHROLOGY OUTPATIENT PROGRESS NOTE    Patient name: Charles Galvan  MRN: [id number]  DATE OF SERVICE: 12/19/2020     CC: Management of transplanted kidney and immunosuppression     ESRD and TRANSPLANT HISTORY  Charles Galvan is a 68yr gentleman with a history of stage V chronic kidney disease due to type II diabetes mellitusand hypertension who received a deceased-donor renal transplant (en-bloc kidneys from a 10.8-kg donor) on03/03/14.He was nonsensitized with intermediate risk for CMV infection.      Posttransplant course was remarkable forslow graft function but he eventually achieved excellent allograft function with Cr around 0.8 mg/dL.  1 year surveillance biopsy in March 2015 showed no evidence of rejection.      His follow up at the transplant clinic has been sporadic in recent years.  He was last seen in April 2018.  Prior to that, last visit was in 2015.      INTERVAL HISTORY:  Since last seen   - I saw him in August 2019 and Dr. Duke Salvia saw him in July 2020.  Today is the first clinic visit since   - he reports that he has been under a lot of stress.  His wife had a stroke and they went through a divorce.  Also, his daughter has new onset schizophrenia  -He reports having diarrhea since August or September 2021.  He would have a bowel movement every time he eats.  He also has abdominal pain and bloating.  He has lost 20 pounds.  The last time he had colonoscopy was 2 years ago.        REVIEW OF SYSTEMS:  CONSTITUTIONAL:  No fevers, chills, sweats     PULMONARY:  Denies cough or SOB  CARDIOVASCULAR:  Denies chest pain or palpitation.    GI:  Per above   GENITOURINARY:  Denies dysuria or hesitancy.       All other systems reviewed and were negative       PAST MEDICAL/SURGICAL HISTORY  Past Medical History:   Diagnosis Date    Diabetes mellitus ([place])     Hypertension     Kidney disease        SOCIAL HISTORY and FAMILY HISTORY   Reviewed and noncontributory    Allergies  Allergies    Allergen Reactions    Gabapentin Other-Reaction in Comments     groggy        Medications  Current Outpatient Medications   Medication Sig Dispense Refill    Blood Glucose Meter (ACCU-CHEK AVIVA PLUS) Kit 1 kit one time. 1 kit 3    Duloxetine (CYMBALTA) 30 mg Delayed Release Capsule Take 1 capsule by mouth every day. 30 capsule 2    FamoTIDine (PEPCID) 20 mg Tablet Tablet Take 1 tablet by mouth every morning. 30 tablet 6    ferrous sulfate 325 mg (65 mg iron) Tablet Take 1 tablet by mouth 2 times daily. 60 tablet 11    Insulin Glargine (LANTUS) 100 unit/mL Vial Inject 10 units subcutaneously daily 10 mL 6    insulin syr/ndl U100 half mark (BD INSULIN SYRINGE HALF UNIT MARKINGS) 0.3 mL 31 gauge x 5/16" Syringe use 1 syringe to inject lantus daily at bedtime 100 syringe 3    Insulin Syringe-Needle U-100 0.3 mL 31 gauge x 5/16 Use Daily with Lantus. 100 each 3    Metoprolol Tartrate (LOPRESSOR) 25 mg Tablet Take 1 tablet by mouth 2 times daily. 60 tablet 11  Mycophenolate (MMF) 250 mg Capsule Take 3 capsules by mouth 2 times daily. Or as directed. Do NOT take calcium or antacids within 2 hours of any dose. +++ ICD 10: Z94.0 +++ There will be no more refills until a clinic visit has been completed. 180 capsule 0    Pravastatin (PRAVACHOL) 20 mg Tablet Take 1 tablet by mouth every day. 30 tablet 5    Tacrolimus (PROGRAF) 1 mg Capsule Take 4 capsules by mouth twice a day; Or as directed by MD. Take doses every 12 hours. Take morning dose AFTER blood draw on lab days.  +++ ICD 10: Z 94.0 +++  There will be no more refills until a transplant clinic visit has been completed. 240 capsule 0     No current facility-administered medications for this visit.       PHYSICAL EXAM:  VITAL SIGNS:    BP 114/68 (SITE: right arm, Orthostatic Position: sitting, Cuff Size: regular)   Pulse 95   Temp 36.6 C (97.9 F)   Wt 67.4 kg (148 lb 9.8 oz)   SpO2 99%   BMI 26.33 kg/m   Wt Readings from Last 5 Encounters:    12/19/20 67.4 kg (148 lb 9.8 oz)   04/27/19 63.2 kg (139 lb 5.3 oz)   06/10/18 61.9 kg (136 lb 7.4 oz)   02/05/17 69.3 kg (152 lb 12.5 oz)   01/13/14 73.3 kg (161 lb 9.6 oz)       CONSTITUION:  Appears stated age.  No acute distress   EYES:    No scleral icterus.  No conjunctivitis.    ENT:     Clear oropharynx. No oral thrush or ulcers.   CV:    RRR, no murmur. No peripheral edema.  RESPIRATORY:   CTAB; no crackles     GI:     Abdomen soft, non-tender, bowel sounds normal.   GU:   Positive peritransplant bruit.  No kidney allograft tenderness.      SKIN:    Surgical incision clean & dry.  No rash.  NEURO:   Alert and oriented times three.  No tremor.  PSYCH:  Appropriate affect.  Though content normal.      LAB TESTS/STUDIES:Results for DAM, ASHRAF (MRN [id number]) as of 01/08/2021 00:38   Ref. Range 11/22/2020 11:19   Sodium Latest Ref Range: 134 - 144 mmol/L 143   Potassium Latest Ref Range: 3.5 - 5.2 mmol/L 3.6   Chloride Latest Ref Range: 96 - 106 mmol/L 101   Carbon Dioxide, TotalExt Latest Ref Range: 20 - 29 mmol/L 22   Urea Nitrogen (BUN)Ext Latest Ref Range: 6 - 24 mg/dL 10   CREATININE Latest Ref Range: 0.76 - 1.27 mg/dL 0.74 (L)   Glucose Latest Ref Range: 65 - 99 mg/dL 70   Calcium Latest Ref Range: 8.7 - 10.2 mg/dL 9.3   Albumin Latest Ref Range: 3.8 - 4.9 g/dL 4.6   Alkaline Phosphatase (ALP) Latest Ref Range: 44 - 121 IU/L 105   Aspartate Transaminase (AST) Latest Ref Range: 0 - 40 IU/L 18   Bilirubin Total Latest Ref Range: 0.0 - 1.2 mg/dL 0.5   Alanine Transferase (ALT) Latest Ref Range: 0 - 44 IU/L 23   eGFR If Africn AmExt Latest Ref Range: >59 mL/min/1.73 122   eGFR If NonAfricn AmExt Latest Ref Range: >59 mL/min/1.73 105   BUN/Creatinine RatioExt Latest Ref Range: 9 - 20  14   Phosphorus (PO4)Ext Latest Ref Range: 2.8 - 4.1 mg/dL 3.0  Protein, TotalExt Latest Ref Range: 6.0 - 8.5 g/dL 7.2   Globulin Latest Ref Range: 1.5 - 4.5 g/dL 2.6   Albumin/Globulin RatioExt Latest Ref Range: 1.2  - 2.2  1.8   Magnesium (Mg)Ext Latest Ref Range: 1.6 - 2.3 mg/dL 1.9   White Blood Cell Count Latest Ref Range: 3.4 - 10.8 x10E3/uL 6.2   Red Blood Cell Count Latest Ref Range: 4.14 - 5.80 x10E6/uL 5.21   Hemoglobin Latest Ref Range: 13.0 - 17.7 g/dL 15.0   Hematocrit Latest Ref Range: 37.5 - 51.0 % 46.4   MCV Latest Ref Range: 79 - 97 fL 89   MCH Latest Ref Range: 26.6 - 33.0 pg 28.8   MCHC g/dL Latest Ref Range: 31.5 - 35.7 g/dL 32.3   RDW Latest Ref Range: 11.6 - 15.4 % 12.7   Platelet Count Latest Ref Range: 150 - 450 x10E3/uL 215   Neutrophils % Auto Latest Ref Range: Not Estab. % 52   Immature Granulocytes % Auto Latest Ref Range: Not Estab. % 0   Lymphocytes % Auto Latest Ref Range: Not Estab. % 35   Monocytes % Auto Latest Ref Range: Not Estab. % 10   Eosinophils % Auto Latest Ref Range: Not Estab. % 2   Basophils % Auto Latest Ref Range: Not Estab. % 1   Neutrophils Abs Auto Latest Ref Range: 1.4 - 7.0 x10E3/uL 3.1   Immature Granulocytes Abs Auto Latest Ref Range: 0.0 - 0.1 x10E3/uL 0.0   Lymphocytes Abs Auto Latest Ref Range: 0.7 - 3.1 x10E3/uL 2.2   Monocytes Abs Auto Latest Ref Range: 0.1 - 0.9 x10E3/uL 0.6   Eosinophils Abs Auto Latest Ref Range: 0.0 - 0.4 x10E3/uL 0.2   Basophils Abs Auto Latest Ref Range: 0.0 - 0.2 x10E3/uL 0.1   Tacrolimus by ImmunoassayExt Latest Ref Range: 2.0 - 20.0 ng/mL 7.4   Color Latest Ref Range: Yellow  Yellow   AppearanceExt Latest Ref Range: Clear  Clear   pHExt Latest Ref Range: 5.0 - 7.5  6.5   Specific GravityExt Latest Ref Range: 1.005 - 1.030  <=1.005 (Abnl)   Occult BloodExt Latest Ref Range: Negative  Negative   BilirubinExt Latest Ref Range: Negative  Negative   KetonesExt Latest Ref Range: Negative  Negative   GlucoseExt Latest Ref Range: Negative  Trace (Abnl)   Protein Urine Latest Ref Range: Negative/Trace  Negative   Urobilinogen Latest Ref Range: 0.2 - 1.0 mg/dL 0.2   Nitrite UrineExt Latest Ref Range: Negative  Negative   Leuk. EsteraseExt Latest  Ref Range: Negative  Negative   Microscopic ExaminationExt Unknown See below:   Urine WBCsExt Latest Ref Range: 0 - 5 /hpf None seen   BacteriaExt Latest Ref Range: None seen/Few  None seen   CastsExt Latest Ref Range: None seen /lpf None seen   Epithelial CellsExt Latest Ref Range: 0 - 10 /hpf None seen   Urine RBCsExt Latest Ref Range: 0 - 2 /hpf None seen   CREATININE URINEEXT Latest Ref Range: Not Estab. mg/dL 10.0   Protein, Total, Random UrExt Latest Ref Range: Not Estab. mg/dL <4.0   Protein/Creatinine RatioExt Latest Ref Range: 0 - 200 mg/g creat Comment (Abnl)     ASSESSMENT AND RECOMMENDATIONS:    Kidney transplant function - deceased donor kidney transplant with peds enbloc kidneys March 2014   -Stable transplant renal function with creatinine 0.7 mg/dL  - no significant proteinuria  - he has peritransplant bruit, although clinical suspicion of transplant renal artery stenosis is  not high given stable renal function     Management of Immunosuppression  - for GI symptoms, will hold mycophenolate for 1 week to see whether symptoms improve   - continue tacrolimus twice daily.  Target trough level between 5-8 ng/ml     Diarrhea  -If diarrhea does not improve, will also check CMV viral load and check stool for norovirus      Volume status and hypertension:    - appears euvolemic  -Blood pressure control is adequate    Type II  diabetes mellitus:   -Management per endocrinology    Hematologic   - no significant issue with drug induced leukopenia or anemia     Electrolytes:  - no significant issues     Health maintenance  -He has not received any COVID-19 vaccine, which I recommended      Patient Instructions   For your anti-rejection medications:  - Stop mycophenolate for one week.  Let us know whether diarrhea is better     - continue routine labs every 2-3 months   - return to clinic in 1 year     Total time I spent in care of this patient today (excluding time spent on other billable services) was  40 minutes.      Leim Fabry, MD  Attending Physician  PI# [id number]  Department of Internal Medicine  Section of Transplant Nephrology  Pager # [phone]  Office # [phone]    12/19/2020

## 2020-12-19 NOTE — Patient Instructions (Signed)
For your anti-rejection medications:  - Stop mycophenolate for one week.  Let us know whether diarrhea is better     - continue routine labs every 2-3 months   - return to clinic in 1 year

## 2020-12-20 ENCOUNTER — Other Ambulatory Visit: Payer: Self-pay

## 2020-12-27 ENCOUNTER — Telehealth: Payer: Self-pay | Admitting: Internal Medicine

## 2020-12-27 NOTE — Telephone Encounter (Signed)
Please ask him to resume tacrolimus.    Would switch him to azathioprine 100 mg daily.      Thanks  Minerva Areola

## 2020-12-27 NOTE — Telephone Encounter (Signed)
Call returned to patient but went to voicemail.    Patient was told to hold MMF x7 days; in my message I reiterated that ONLY MMF was to be held and tacrolimus should still be taken as prescribed.    Dr.Huang- given that he is still having some complaints of bloating etc would you want to hold further/reduce dose or switch meds?

## 2020-12-27 NOTE — Telephone Encounter (Signed)
Received call from patient, 3 IDs used. Patient saying he stopped MMF and Tacrolimus for 7 days and found his symptoms of diarrhea. Patient said it has gotten better, but he still has some bloating, pain and gas. Patient can be reached at [phone]

## 2020-12-29 ENCOUNTER — Other Ambulatory Visit: Payer: Self-pay

## 2020-12-29 ENCOUNTER — Telehealth: Payer: Self-pay

## 2020-12-29 MED ORDER — AZATHIOPRINE 50 MG TABLET
100.0000 mg | ORAL_TABLET | Freq: Every day | ORAL | 5 refills | Status: DC
Start: 2020-12-29 — End: 2021-06-15
  Filled 2020-12-29: qty 60, 30d supply, fill #0
  Filled 2021-01-20: qty 60, 30d supply, fill #1
  Filled 2021-02-20: qty 60, 30d supply, fill #2
  Filled 2021-03-22: qty 60, 30d supply, fill #3
  Filled 2021-04-18: qty 60, 30d supply, fill #4
  Filled 2021-05-16: qty 60, 30d supply, fill #5

## 2020-12-29 NOTE — Telephone Encounter (Signed)
Spoke with patient on the telephone, verified with 3 IDs. Patient was counseled on new start azathioprine (Imunran) on appropriate administration, dosage, and side effects. All questions answered in this phone call.    Kenton Kingfisher, PharmD  Clinical Pharmacist, Solid Organ Transplant  [facility] [phone]) "Transplant Pharmacist": or [employer]    Department of Pharmacy Services

## 2020-12-29 NOTE — Telephone Encounter (Signed)
Patient returned call and was able to recite plan to STOP MMF and take Imuran 100mg  daily. He did confirm that he has resumed FK but had stopped.  He will repeat labs the week after next; a week after starting Imuran.    No further questions at this time.

## 2020-12-29 NOTE — Telephone Encounter (Signed)
Per Dr.Huang  Ensure taking FK and switch from MMF to Imuran 100mg  DAILY.    Voicemessage left on patients IDd voicemail.    Requested patient ensure he is taking FK and to call back to discuss a new medication that will replace the MMF.

## 2021-01-20 ENCOUNTER — Other Ambulatory Visit: Payer: Self-pay

## 2021-01-25 ENCOUNTER — Other Ambulatory Visit: Payer: Self-pay

## 2021-02-20 ENCOUNTER — Other Ambulatory Visit: Payer: Self-pay

## 2021-02-28 ENCOUNTER — Other Ambulatory Visit: Payer: Self-pay

## 2021-03-22 ENCOUNTER — Other Ambulatory Visit: Payer: Self-pay

## 2021-03-23 ENCOUNTER — Other Ambulatory Visit: Payer: Self-pay

## 2021-03-24 ENCOUNTER — Other Ambulatory Visit: Payer: Self-pay

## 2021-03-27 ENCOUNTER — Other Ambulatory Visit: Payer: Self-pay

## 2021-04-18 ENCOUNTER — Other Ambulatory Visit: Payer: Self-pay

## 2021-04-19 ENCOUNTER — Other Ambulatory Visit: Payer: Self-pay

## 2021-04-20 ENCOUNTER — Other Ambulatory Visit: Payer: Self-pay

## 2021-05-12 ENCOUNTER — Other Ambulatory Visit: Payer: Self-pay

## 2021-05-16 ENCOUNTER — Other Ambulatory Visit: Payer: Self-pay

## 2021-05-19 ENCOUNTER — Other Ambulatory Visit: Payer: Self-pay

## 2021-05-22 ENCOUNTER — Other Ambulatory Visit: Payer: Self-pay

## 2021-05-26 ENCOUNTER — Other Ambulatory Visit: Payer: Self-pay

## 2021-05-29 ENCOUNTER — Other Ambulatory Visit: Payer: Self-pay

## 2021-06-15 ENCOUNTER — Other Ambulatory Visit: Payer: Self-pay | Admitting: Internal Medicine

## 2021-06-15 ENCOUNTER — Other Ambulatory Visit: Payer: Self-pay

## 2021-06-15 MED ORDER — AZATHIOPRINE 50 MG TABLET
100.0000 mg | ORAL_TABLET | Freq: Every day | ORAL | 0 refills | Status: DC
Start: 2021-06-15 — End: 2021-07-13
  Filled 2021-06-15: qty 60, 30d supply, fill #0

## 2021-06-20 ENCOUNTER — Other Ambulatory Visit: Payer: Self-pay

## 2021-06-23 ENCOUNTER — Other Ambulatory Visit: Payer: Self-pay

## 2021-06-28 ENCOUNTER — Other Ambulatory Visit: Payer: Self-pay | Admitting: Internal Medicine

## 2021-06-28 ENCOUNTER — Other Ambulatory Visit: Payer: Self-pay

## 2021-06-28 NOTE — Telephone Encounter (Signed)
Refill request for Imuran.  Per last visit note in Feb 2022- labs q 2-3 months and RTC in 1 year.  Last lab results are from February 2022; after a request was made for repeat labs following med change.  Reqest sent to AA's to ensure local standing orders remain valid.    Patient to contact provider regarding med renewal request.

## 2021-07-03 ENCOUNTER — Other Ambulatory Visit: Payer: Self-pay

## 2021-07-04 ENCOUNTER — Other Ambulatory Visit: Payer: Self-pay

## 2021-07-13 ENCOUNTER — Other Ambulatory Visit: Payer: Self-pay | Admitting: Internal Medicine

## 2021-07-13 ENCOUNTER — Other Ambulatory Visit: Payer: Self-pay

## 2021-07-14 ENCOUNTER — Other Ambulatory Visit: Payer: Self-pay

## 2021-07-14 LAB — COMPREHENSIVE METABOLIC PANEL (EXTERNAL LAB)
A/G Ratio_Ext: 1.6 (ref 1.2–2.2)
ALT (SGPT)_Ext: 9 IU/L (ref 0–44)
AST (SGOT)_Ext: 8 IU/L (ref 0–40)
Albumin_Ext: 4.4 g/dL (ref 3.8–4.9)
Alkaline Phosphatase_Ext: 86 IU/L (ref 44–121)
BUN/Creatinine Ratio_Ext: 19 (ref 9–20)
BUN_Ext: 12 mg/dL (ref 6–24)
Bilirubin, Total_Ext: 0.7 mg/dL (ref 0.0–1.2)
Calcium_Ext: 9.2 mg/dL (ref 8.7–10.2)
Carbon Dioxide, Total_Ext: 22 mmol/L (ref 20–29)
Chloride_Ext: 97 mmol/L (ref 96–106)
Creatinine_Ext: 0.62 mg/dL — ABNORMAL LOW (ref 0.76–1.27)
Globulin, Total_Ext: 2.7 g/dL (ref 1.5–4.5)
Glucose_Ext: 251 mg/dL — ABNORMAL HIGH (ref 65–99)
Potassium_Ext: 4.4 mmol/L (ref 3.5–5.2)
Protein, Total_Ext: 7.1 g/dL (ref 6.0–8.5)
Sodium_Ext: 136 mmol/L (ref 134–144)
eGFR_Ext: 114 mL/min/{1.73_m2} (ref >59)

## 2021-07-14 LAB — URINALYSIS, COMPLETE (EXTERNAL LAB)
Bilirubin_Ext: NEGATIVE
Nitrite, Urine_Ext: NEGATIVE
Specific Gravity_Ext: >=1.03 — AB (ref 1.005–1.030)
Urobilinogen,Semi-Qn_Ext: 0.2 mg/dL (ref 0.2–1.0)
pH_Ext: 6 (ref 5.0–7.5)

## 2021-07-14 LAB — PROTEIN, TOTAL W/CREAT, RANDOM URINE (EXTERNAL LAB)
Creatinine, Urine_Ext: 71.4 mg/dL
Protein,Total,Urine_Ext: 42.7 mg/dL
Protein/Creat Ratio_Ext: 598 mg/g creat — ABNORMAL HIGH (ref 0–200)

## 2021-07-14 LAB — CBC (INCLUDES DIFF/PLT) (EXTERNAL LAB)
Basophils % Auto: 0 %
Basophils Abs Auto: 0 10*3/uL (ref 0.0–0.2)
Eosinophils % Auto: 1 %
Eosinophils Abs Auto: 0.1 10*3/uL (ref 0.0–0.4)
Hematocrit: 43.3 % (ref 37.5–51.0)
Hemoglobin: 13.9 g/dL (ref 13.0–17.7)
Immature Grans (Abs)_Ext: 0 10*3/uL (ref 0.0–0.1)
Immature Granulocytes_Ext: 0 %
Lymphocytes % Auto: 12 %
Lymphocytes Abs Auto: 0.9 10*3/uL (ref 0.7–3.1)
MCH: 30.5 pg (ref 26.6–33.0)
MCHC g/dL: 32.1 g/dL (ref 31.5–35.7)
MCV: 95 fL (ref 79–97)
Monocytes % Auto: 8 %
Monocytes Abs Auto: 0.6 10*3/uL (ref 0.1–0.9)
Neutrophils % Auto: 79 %
Neutrophils Abs Auto: 5.9 10*3/uL (ref 1.4–7.0)
Platelet Count: 184 10*3/uL (ref 150–450)
RDW: 12.5 % (ref 11.6–15.4)
Red Blood Cell Count: 4.56 x10E6/uL (ref 4.14–5.80)
White Blood Cell Count: 7.5 10*3/uL (ref 3.4–10.8)

## 2021-07-14 LAB — MICROSCOPIC EXAM URINE (EXTERNAL LAB)
Bacteria_Ext: NONE SEEN
Casts_Ext: NONE SEEN /lpf
Epithelial Cells (non renal)_Ext: NONE SEEN /hpf (ref 0–10)

## 2021-07-14 LAB — PHOSPHATE (AS PHOSPHORUS) (EXTERNAL LAB): Phosphorus_Ext: 2.3 mg/dL — ABNORMAL LOW (ref 2.8–4.1)

## 2021-07-14 LAB — MAGNESIUM (EXTERNAL LAB): Magnesium_Ext: 1.7 mg/dL (ref 1.6–2.3)

## 2021-07-14 MED ORDER — AZATHIOPRINE 50 MG TABLET
100.0000 mg | ORAL_TABLET | Freq: Every day | ORAL | 3 refills | Status: DC
Start: 2021-07-14 — End: 2021-11-16
  Filled 2021-07-14 – 2021-07-31 (×2): qty 60, 30d supply, fill #0
  Filled 2021-08-24: qty 60, 30d supply, fill #1
  Filled 2021-09-20: qty 60, 30d supply, fill #2
  Filled 2021-10-10 – 2021-10-24 (×2): qty 60, 30d supply, fill #3

## 2021-07-16 LAB — TACROLIMUS BY IMMUNOASSAY (EXTERNAL LAB): Tacrolimus by Immunoassay_Ext: 10.9 ng/mL (ref 2.0–20.0)

## 2021-07-17 ENCOUNTER — Other Ambulatory Visit: Payer: Self-pay

## 2021-07-20 ENCOUNTER — Other Ambulatory Visit: Payer: Self-pay

## 2021-07-26 ENCOUNTER — Other Ambulatory Visit: Payer: Self-pay

## 2021-07-31 ENCOUNTER — Other Ambulatory Visit: Payer: Self-pay

## 2021-07-31 NOTE — Progress Notes (Unsigned)
Specialty Pharmacy Telephone Encounter: Refill Coordination     [facility] Specialty Pharmacy: Solid Organ Transplant Program    Called patient to coordinate refill of medication(s) filled at [facility]:    Azathioprine 50mg  #60  Tacrolimus 1mg  #240 4/4     Contact Person:   Patient   Number of Supplies left in Days: 7 Days      Ship Date:    08/01/2021  Carrier Method:   FedEx Overnight  Signature Option:   Direct Signature Required  Tracking #:             Telephone follow-up prior to next refill due date, but patient is encouraged to contact pharmacy sooner if any issues arise.         10/01/2021  Pharmacy Technician III  Solid Organ Transplant/Specialty Pharmacy  Ph: [phone] Option 9, 1, 4  07/31/21, 12:59 PM

## 2021-08-01 ENCOUNTER — Other Ambulatory Visit: Payer: Self-pay

## 2021-08-24 ENCOUNTER — Other Ambulatory Visit: Payer: Self-pay

## 2021-08-25 ENCOUNTER — Other Ambulatory Visit: Payer: Self-pay

## 2021-08-25 NOTE — Progress Notes (Unsigned)
Specialty Pharmacy Telephone Encounter: Medication Fill Coordination     Joriel Streety  [id number]    Called patient to coordinate medication(s) to be filled at [facility]:    Azathioprine 50mg  #60  Tacrolimus 1mg  #240 4/4     Ship Date Month/Day Initials/Program Carrier Option Tracking Number   11/7 VR SOT [employer] Overnight Direct Signature Required  [employer]      [employer]    Contact Person:   Patient   Number of Supplies left in Days: 9    Credit Card on File:   N/A- $0 Copay    13/7  Pharmacy Technician III  Solid Organ Transplant/Specialty Pharmacy  Ph: [phone] Option 9, 1, 4  08/25/21, 9:47 AM

## 2021-08-28 ENCOUNTER — Other Ambulatory Visit: Payer: Self-pay

## 2021-09-20 ENCOUNTER — Other Ambulatory Visit: Payer: Self-pay

## 2021-09-20 NOTE — Progress Notes (Signed)
Specialty Pharmacy Telephone Encounter: Medication Fill Coordination     Huber Mathers  [id number]    Called patient to coordinate medication(s) to be filled at [facility]:    Azathioprine 50mg  #60  Tacrolimus 1mg  #240 4/4      Telephone follow-up will be scheduled prior to the next refill due date, but patient is encouraged to contact pharmacy sooner if any issues arise.     Ship Date Month/Day Initials/Program Carrier Option Tracking Number   12/1 VR SOT [employer] Overnight Direct Signature Required  [employer]       [employer]    Contact Person:   Patient   Number of Supplies Left in Days: 9    Credit Card on File:   N/A- $0 Copay    14/1  Pharmacy Technician III  Solid Organ Transplant/Specialty Pharmacy  Ph: [phone] Option 9, 1, 4  09/20/21, 4:20 PM

## 2021-09-21 ENCOUNTER — Other Ambulatory Visit: Payer: Self-pay

## 2021-09-27 ENCOUNTER — Other Ambulatory Visit: Payer: Self-pay

## 2021-10-10 ENCOUNTER — Other Ambulatory Visit: Payer: Self-pay

## 2021-10-12 ENCOUNTER — Other Ambulatory Visit: Payer: Self-pay

## 2021-10-19 ENCOUNTER — Other Ambulatory Visit: Payer: Self-pay

## 2021-10-24 ENCOUNTER — Other Ambulatory Visit: Payer: Self-pay

## 2021-10-26 ENCOUNTER — Other Ambulatory Visit: Payer: Self-pay

## 2021-10-26 NOTE — Progress Notes (Signed)
Specialty Pharmacy Telephone Encounter: Medication Fill Coordination     Stosh Slavich  [id number]    Called patient to coordinate medication(s) to be filled at [place]:    Azathioprine 50mg     Tacrolimus 1mg       Telephone follow-up will be scheduled prior to the next refill due date, but patient is encouraged to contact pharmacy sooner if any issues arise.   Ship Date Month/Day Initials/Program Carrier [employer] Option Tracking Number   1/6 VR SOT [employer]Ex Overnight No Signature Required  [id number]        [employer]    Contact Person:   Patient   Number of Supplies Left in Days: 9    Credit Card on File:   N/A- $0 Copay    [place] Technician III  Solid Organ Transplant/Specialty Pharmacy  Ph: [phone] Option 9, 1, 4  10/26/21, 2:28 PM

## 2021-10-27 ENCOUNTER — Other Ambulatory Visit: Payer: Self-pay

## 2021-11-02 ENCOUNTER — Other Ambulatory Visit: Payer: Self-pay | Admitting: Internal Medicine

## 2021-11-03 LAB — PHOSPHATE (AS PHOSPHORUS) (EXTERNAL LAB): Phosphorus_Ext: 2.7 mg/dL — ABNORMAL LOW (ref 2.8–4.1)

## 2021-11-03 LAB — URINALYSIS, COMPLETE (EXTERNAL LAB)
Bilirubin_Ext: NEGATIVE
Glucose_Ext: NEGATIVE
Ketones_Ext: NEGATIVE
Nitrite, Urine_Ext: NEGATIVE
Occult Blood_Ext: NEGATIVE
Protein_Ext: NEGATIVE
Specific Gravity_Ext: 1.007 (ref 1.005–1.030)
Urobilinogen,Semi-Qn_Ext: 0.2 mg/dL (ref 0.2–1.0)
WBC Esterase_Ext: NEGATIVE
pH_Ext: 6.5 (ref 5.0–7.5)

## 2021-11-03 LAB — COMPREHENSIVE METABOLIC PANEL (EXTERNAL LAB)
A/G Ratio_Ext: 1.6 (ref 1.2–2.2)
ALT (SGPT)_Ext: 18 IU/L (ref 0–44)
AST (SGOT)_Ext: 21 IU/L (ref 0–40)
Albumin_Ext: 4.6 g/dL (ref 3.8–4.9)
Alkaline Phosphatase_Ext: 106 IU/L (ref 44–121)
BUN/Creatinine Ratio_Ext: 17 (ref 9–20)
BUN_Ext: 12 mg/dL (ref 6–24)
Bilirubin, Total_Ext: 0.4 mg/dL (ref 0.0–1.2)
Calcium_Ext: 9.4 mg/dL (ref 8.7–10.2)
Carbon Dioxide, Total_Ext: 23 mmol/L (ref 20–29)
Chloride_Ext: 99 mmol/L (ref 96–106)
Creatinine_Ext: 0.72 mg/dL — ABNORMAL LOW (ref 0.76–1.27)
Globulin, Total_Ext: 2.9 g/dL (ref 1.5–4.5)
Glucose_Ext: 142 mg/dL — ABNORMAL HIGH (ref 70–99)
Potassium_Ext: 3.8 mmol/L (ref 3.5–5.2)
Protein, Total_Ext: 7.5 g/dL (ref 6.0–8.5)
Sodium_Ext: 139 mmol/L (ref 134–144)
eGFR_Ext: 109 mL/min/1.73 (ref >59)

## 2021-11-03 LAB — CBC (INCLUDES DIFF/PLT) (EXTERNAL LAB)
Basophils % Auto: 1 %
Basophils Abs Auto: 0.1 10*3/uL (ref 0.0–0.2)
Eosinophils % Auto: 3 %
Eosinophils Abs Auto: 0.2 10*3/uL (ref 0.0–0.4)
Hematocrit: 40.3 % (ref 37.5–51.0)
Hemoglobin: 13.4 g/dL (ref 13.0–17.7)
Immature Grans (Abs)_Ext: 0 10*3/uL (ref 0.0–0.1)
Immature Granulocytes_Ext: 1 %
Lymphocytes % Auto: 27 %
Lymphocytes Abs Auto: 1.8 10*3/uL (ref 0.7–3.1)
MCH: 31.5 pg (ref 26.6–33.0)
MCHC g/dL: 33.3 g/dL (ref 31.5–35.7)
MCV: 95 fL (ref 79–97)
Monocytes % Auto: 10 %
Monocytes Abs Auto: 0.7 10*3/uL (ref 0.1–0.9)
Neutrophils % Auto: 58 %
Neutrophils Abs Auto: 3.7 10*3/uL (ref 1.4–7.0)
Platelet Count: 189 10*3/uL (ref 150–450)
RDW: 13 % (ref 11.6–15.4)
Red Blood Cell Count: 4.25 x10E6/uL (ref 4.14–5.80)
White Blood Cell Count: 6.4 10*3/uL (ref 3.4–10.8)

## 2021-11-03 LAB — MICROSCOPIC EXAM URINE (EXTERNAL LAB)
Bacteria_Ext: NONE SEEN
Casts_Ext: NONE SEEN /lpf
Epithelial Cells (non renal)_Ext: NONE SEEN /hpf (ref 0–10)
RBC_Ext: NONE SEEN /hpf (ref 0–2)
WBC_Ext: NONE SEEN /hpf (ref 0–5)

## 2021-11-03 LAB — PROTEIN, TOTAL W/CREAT, RANDOM URINE (EXTERNAL LAB)
Creatinine, Urine_Ext: 24.7 mg/dL
Protein,Total,Urine_Ext: <4 mg/dL

## 2021-11-03 LAB — MAGNESIUM (EXTERNAL LAB): Magnesium_Ext: 1.8 mg/dL (ref 1.6–2.3)

## 2021-11-06 LAB — TACROLIMUS BY IMMUNOASSAY (EXTERNAL LAB): Tacrolimus by Immunoassay_Ext: 11.1 ng/mL (ref 2.0–20.0)

## 2021-11-07 ENCOUNTER — Telehealth: Payer: Self-pay

## 2021-11-07 NOTE — Telephone Encounter (Signed)
LVM to call and sched follow up

## 2021-11-16 ENCOUNTER — Other Ambulatory Visit: Payer: Self-pay

## 2021-11-16 ENCOUNTER — Other Ambulatory Visit: Payer: Self-pay | Admitting: Internal Medicine

## 2021-11-17 ENCOUNTER — Other Ambulatory Visit: Payer: Self-pay

## 2021-11-17 MED ORDER — AZATHIOPRINE 50 MG TABLET
100.0000 mg | ORAL_TABLET | Freq: Every day | ORAL | 3 refills | Status: DC
Start: 2021-11-17 — End: 2022-03-06
  Filled 2021-11-17 – 2021-11-22 (×2): qty 60, 30d supply, fill #0
  Filled 2021-12-18: qty 60, 30d supply, fill #1
  Filled 2022-01-15: qty 60, 30d supply, fill #2
  Filled 2022-02-14: qty 60, 30d supply, fill #3

## 2021-11-22 ENCOUNTER — Other Ambulatory Visit: Payer: Self-pay

## 2021-11-22 NOTE — Progress Notes (Signed)
Specialty Pharmacy Telephone Encounter: Medication Fill Coordination     Charles Galvan  [id number]    Called patient to coordinate medication(s) to be filled at [place]:    Azathioprine 50mg     Tacrolimus 1mg      Telephone follow-up will be scheduled prior to the next refill due date, but patient is encouraged to contact pharmacy sooner if any issues arise.     Ship Date Month/Day Initials/Program Carrier Method Signature Option Tracking Number   2/2 VR SOT [employer] Overnight Direct Signature Required  [id number]       [employer]    Contact Person:   Patient   Number of Supplies Left in Days: 9    Credit Card on File:   N/A- $0 Copay    [place] Technician III  Solid Organ Transplant/Specialty Pharmacy  Ph: [phone] Option 9, 1, 4  11/22/21, 10:06 AM

## 2021-11-23 ENCOUNTER — Other Ambulatory Visit: Payer: Self-pay

## 2021-12-15 ENCOUNTER — Other Ambulatory Visit: Payer: Self-pay

## 2021-12-15 ENCOUNTER — Other Ambulatory Visit: Payer: Self-pay | Admitting: Internal Medicine

## 2021-12-15 DIAGNOSIS — Z940 Kidney transplant status: Secondary | ICD-10-CM

## 2021-12-18 ENCOUNTER — Other Ambulatory Visit: Payer: Self-pay

## 2021-12-18 MED ORDER — TACROLIMUS 1 MG CAPSULE, IMMEDIATE-RELEASE
ORAL_CAPSULE | ORAL | 11 refills | Status: DC
Start: 2021-12-18 — End: 2022-11-23
  Filled 2021-12-18: qty 240, fill #0
  Filled 2021-12-18: qty 240, 30d supply, fill #0
  Filled 2022-01-15: qty 240, 30d supply, fill #1
  Filled 2022-02-14: qty 240, 30d supply, fill #2
  Filled 2022-03-20: qty 240, 30d supply, fill #3
  Filled 2022-04-20 – 2022-05-22 (×2): qty 240, 30d supply, fill #4
  Filled 2022-06-12 – 2022-06-27 (×2): qty 240, 30d supply, fill #5
  Filled 2022-07-24: qty 240, 30d supply, fill #6
  Filled 2022-08-17: qty 240, 30d supply, fill #7
  Filled 2022-09-12: qty 240, 30d supply, fill #8
  Filled 2022-10-08: qty 240, 30d supply, fill #9
  Filled 2022-11-01: qty 240, 30d supply, fill #10

## 2021-12-19 ENCOUNTER — Other Ambulatory Visit: Payer: Self-pay

## 2021-12-19 NOTE — Progress Notes (Signed)
Specialty Pharmacy Telephone Encounter: Medication Fill Coordination     Charles Galvan  [name]    Called patient to coordinate medication(s) to be filled at [name]:    Azathioprine 50mg     Tacrolimus 1mg       Telephone follow-up will be scheduled prior to the next refill due date, but patient is encouraged to contact pharmacy sooner if any issues arise.     Ship Date Month/Day Initials/Program Carrier [employer] Option Tracking Number   3/1 VR SOT [employer] Overnight Direct Signature Required  [id number]      [employer]    Contact Person:   Patient   Number of Supplies Left in Days: 9    Credit Card on File:   N/A- $0 Copay    [place] Technician III  Solid Organ Transplant/Specialty Pharmacy  Ph: [phone] Option 9, 1, 4  12/19/21, 11:00 AM

## 2021-12-20 ENCOUNTER — Other Ambulatory Visit: Payer: Self-pay

## 2022-01-15 ENCOUNTER — Other Ambulatory Visit: Payer: Self-pay

## 2022-01-15 NOTE — Progress Notes (Signed)
Specialty Pharmacy Telephone Encounter: Medication Fill Coordination     Charles Galvan  [name]    Called patient to coordinate medication(s) to be filled at [place]:  Azathioprine 50 mg  Tacrolimus 1 mg     Telephone follow-up will be scheduled prior to the next refill due date, but patient is encouraged to contact pharmacy sooner if any issues arise.     Ship Date Month/Day Initials/Program Carrier [employer] Option Tracking Number   3/29 VR SOT [employer] Overnight No Signature Required  [id number]        Contact Person:   Patient   Number of Supplies Left in Days: 12    Credit Card on File:   N/A- $0 Copay    Emeri Estill,CPhT  Specialty Technician   Specialty Pharmacy  P: [phone], option #9, #1  01/15/22 12:27 PM

## 2022-01-16 ENCOUNTER — Other Ambulatory Visit: Payer: Self-pay

## 2022-01-17 ENCOUNTER — Other Ambulatory Visit: Payer: Self-pay

## 2022-01-29 ENCOUNTER — Ambulatory Visit: Payer: Medicare Other | Admitting: Internal Medicine

## 2022-02-02 ENCOUNTER — Other Ambulatory Visit: Payer: Self-pay

## 2022-02-13 ENCOUNTER — Other Ambulatory Visit: Payer: Self-pay

## 2022-02-14 ENCOUNTER — Other Ambulatory Visit: Payer: Self-pay

## 2022-02-15 ENCOUNTER — Other Ambulatory Visit: Payer: Self-pay

## 2022-02-15 NOTE — Progress Notes (Signed)
Specialty Pharmacy Telephone Encounter: Medication Fill Coordination     Charles Galvan  [id number]    Called patient to coordinate medication(s) to be filled at [facility]:    Azathioprine 50mg     Tacrolimus 1mg       Telephone follow-up will be scheduled prior to the next refill due date, but patient is encouraged to contact pharmacy sooner if any issues arise.     Ship Date Month/Day (MM/DD) Initials/Program Carrier Method Signature Option Tracking Number   4/28 VR SOT [employer] Overnight Direct Signature Required       [employer]    Contact Person:   Patient   Number of Supplies Left in Days: 9    Credit Card on File:   N/A- $0 Copay    5/28  Pharmacy Technician III  Solid Organ Transplant/Specialty Pharmacy  Ph: [phone] Option 9, 1, 4  02/15/22, 4:24 PM

## 2022-02-16 ENCOUNTER — Other Ambulatory Visit: Payer: Self-pay

## 2022-03-06 ENCOUNTER — Other Ambulatory Visit: Payer: Self-pay

## 2022-03-06 ENCOUNTER — Other Ambulatory Visit: Payer: Self-pay | Admitting: Internal Medicine

## 2022-03-07 ENCOUNTER — Other Ambulatory Visit: Payer: Self-pay

## 2022-03-09 ENCOUNTER — Other Ambulatory Visit: Payer: Self-pay

## 2022-03-09 MED ORDER — AZATHIOPRINE 50 MG TABLET
100.0000 mg | ORAL_TABLET | Freq: Every day | ORAL | 0 refills | Status: DC
Start: 2022-03-09 — End: 2022-03-20
  Filled 2022-03-09: qty 14, 7d supply, fill #0

## 2022-03-09 NOTE — Telephone Encounter (Signed)
Request from post patient for renewal.  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

## 2022-03-12 ENCOUNTER — Other Ambulatory Visit: Payer: Self-pay

## 2022-03-12 NOTE — Telephone Encounter (Signed)
I'm ok with giving him 3 more months of refill but he needs to know the expectation that he needs to have labs done and see [place] in clinic.    Thanks  Minerva Areola

## 2022-03-14 ENCOUNTER — Other Ambulatory Visit: Payer: Self-pay

## 2022-03-20 ENCOUNTER — Other Ambulatory Visit: Payer: Self-pay

## 2022-03-20 MED ORDER — AZATHIOPRINE 50 MG TABLET
100.0000 mg | ORAL_TABLET | Freq: Every day | ORAL | 0 refills | Status: DC
Start: 2022-03-20 — End: 2022-04-20
  Filled 2022-03-20: qty 60, 30d supply, fill #0

## 2022-03-20 NOTE — Progress Notes (Signed)
Specialty Pharmacy Telephone Encounter: Medication Fill Coordination     Charles Galvan  [name]    Called patient to coordinate medication(s) to be filled at [place]:    Azathioprine 50 mg    Tacrolimus 1 mg     Telephone follow-up will be scheduled prior to the next refill due date, but patient is encouraged to contact pharmacy sooner if any issues arise.     Ship Date   (MM/DD) Initials &  Program Carrier Method Signature Option Tracking Number   06/05 VR SOT [employer] Overnight Direct Signature Required  [phone] [id number]      [employer]    Contact Person: Patient   Number of Supplies Left in Days: 14    Credit Card on File: N/A- $0 Copay    [place] Technician III  Solid Organ Transplant/Specialty Pharmacy  [phone] Opt 9 - 1 - 3    03/20/22  11:40 AM

## 2022-03-21 ENCOUNTER — Other Ambulatory Visit: Payer: Self-pay

## 2022-03-26 ENCOUNTER — Other Ambulatory Visit: Payer: Self-pay

## 2022-04-18 ENCOUNTER — Telehealth: Payer: Self-pay

## 2022-04-18 NOTE — Telephone Encounter (Signed)
Incoming call from pt    Per pt - please a make a note that I called to sched a follow up appt w Dr Renaldo Reel    Informed pt that Dr Renaldo Reel does not have any avail appts at this time but to check back 1st 2nd wk of July

## 2022-04-20 ENCOUNTER — Other Ambulatory Visit: Payer: Self-pay | Admitting: NEPHROLOGY

## 2022-04-20 ENCOUNTER — Other Ambulatory Visit: Payer: Self-pay

## 2022-04-23 ENCOUNTER — Other Ambulatory Visit: Payer: Self-pay

## 2022-04-23 MED ORDER — AZATHIOPRINE 50 MG TABLET
100.0000 mg | ORAL_TABLET | Freq: Every day | ORAL | 0 refills | Status: DC
Start: 2022-04-23 — End: 2022-06-12
  Filled 2022-04-23 – 2022-05-22 (×3): qty 60, 30d supply, fill #0

## 2022-04-24 ENCOUNTER — Other Ambulatory Visit: Payer: Self-pay

## 2022-04-25 ENCOUNTER — Other Ambulatory Visit: Payer: Self-pay

## 2022-04-27 ENCOUNTER — Other Ambulatory Visit: Payer: Self-pay

## 2022-05-01 ENCOUNTER — Other Ambulatory Visit: Payer: Self-pay | Admitting: Internal Medicine

## 2022-05-02 ENCOUNTER — Other Ambulatory Visit: Payer: Self-pay

## 2022-05-02 LAB — URINALYSIS, COMPLETE (EXTERNAL LAB)
Bilirubin_Ext: NEGATIVE
Ketones_Ext: NEGATIVE
Nitrite, Urine_Ext: NEGATIVE
Occult Blood_Ext: NEGATIVE
Protein_Ext: NEGATIVE
Specific Gravity_Ext: >=1.03 — AB (ref 1.005–1.030)
Urobilinogen,Semi-Qn_Ext: 0.2 mg/dL (ref 0.2–1.0)
WBC Esterase_Ext: NEGATIVE
pH_Ext: 7 (ref 5.0–7.5)

## 2022-05-02 LAB — MAGNESIUM (EXTERNAL LAB): Magnesium_Ext: 1.7 mg/dL (ref 1.6–2.3)

## 2022-05-02 LAB — CBC (INCLUDES DIFF/PLT) (EXTERNAL LAB)
Basophils % Auto: 1 %
Basophils Abs Auto: 0 10*3/uL (ref 0.0–0.2)
Eosinophils % Auto: 2 %
Eosinophils Abs Auto: 0.1 10*3/uL (ref 0.0–0.4)
Hematocrit: 43.5 % (ref 37.5–51.0)
Hemoglobin: 14.7 g/dL (ref 13.0–17.7)
Immature Grans (Abs)_Ext: 0 10*3/uL (ref 0.0–0.1)
Immature Granulocytes_Ext: 0 %
Lymphocytes % Auto: 21 %
Lymphocytes Abs Auto: 1 10*3/uL (ref 0.7–3.1)
MCH: 32.8 pg (ref 26.6–33.0)
MCHC g/dL: 33.8 g/dL (ref 31.5–35.7)
MCV: 97 fL (ref 79–97)
Monocytes % Auto: 9 %
Monocytes Abs Auto: 0.4 10*3/uL (ref 0.1–0.9)
Neutrophils % Auto: 67 %
Neutrophils Abs Auto: 3.2 10*3/uL (ref 1.4–7.0)
Platelet Count: 212 10*3/uL (ref 150–450)
RDW: 13.9 % (ref 11.6–15.4)
Red Blood Cell Count: 4.48 x10E6/uL (ref 4.14–5.80)
White Blood Cell Count: 4.8 10*3/uL (ref 3.4–10.8)

## 2022-05-02 LAB — COMPREHENSIVE METABOLIC PANEL (EXTERNAL LAB)
A/G Ratio_Ext: 1.6 (ref 1.2–2.2)
ALT (SGPT)_Ext: 19 IU/L (ref 0–44)
AST (SGOT)_Ext: 26 IU/L (ref 0–40)
Albumin_Ext: 4.2 g/dL (ref 3.8–4.9)
Alkaline Phosphatase_Ext: 97 IU/L (ref 44–121)
BUN/Creatinine Ratio_Ext: 19 (ref 9–20)
BUN_Ext: 12 mg/dL (ref 6–24)
Bilirubin, Total_Ext: 0.5 mg/dL (ref 0.0–1.2)
Calcium_Ext: 9.1 mg/dL (ref 8.7–10.2)
Carbon Dioxide, Total_Ext: 24 mmol/L (ref 20–29)
Chloride_Ext: 97 mmol/L (ref 96–106)
Creatinine_Ext: 0.64 mg/dL — ABNORMAL LOW (ref 0.76–1.27)
Globulin, Total_Ext: 2.7 g/dL (ref 1.5–4.5)
Glucose_Ext: 300 mg/dL — ABNORMAL HIGH (ref 70–99)
Potassium_Ext: 4.4 mmol/L (ref 3.5–5.2)
Protein, Total_Ext: 6.9 g/dL (ref 6.0–8.5)
Sodium_Ext: 137 mmol/L (ref 134–144)
eGFR_Ext: 112 mL/min/{1.73_m2} (ref >59)

## 2022-05-02 LAB — PROTEIN, TOTAL W/CREAT, RANDOM URINE (EXTERNAL LAB)
Creatinine, Urine_Ext: 37.5 mg/dL
Protein,Total,Urine_Ext: 7.3 mg/dL
Protein/Creat Ratio_Ext: 195 mg/g creat (ref 0–200)

## 2022-05-02 LAB — MICROSCOPIC EXAM URINE (EXTERNAL LAB)
Bacteria_Ext: NONE SEEN
Casts_Ext: NONE SEEN /lpf
Epithelial Cells (non renal)_Ext: NONE SEEN /hpf (ref 0–10)
RBC_Ext: NONE SEEN /hpf (ref 0–2)
WBC_Ext: NONE SEEN /hpf (ref 0–5)

## 2022-05-02 LAB — PHOSPHATE (AS PHOSPHORUS) (EXTERNAL LAB): Phosphorus_Ext: 2.8 mg/dL (ref 2.8–4.1)

## 2022-05-03 ENCOUNTER — Other Ambulatory Visit: Payer: Self-pay

## 2022-05-03 LAB — TACROLIMUS BY IMMUNOASSAY (EXTERNAL LAB): Tacrolimus by Immunoassay_Ext: 9.7 ng/mL (ref 2.0–20.0)

## 2022-05-04 ENCOUNTER — Other Ambulatory Visit: Payer: Self-pay

## 2022-05-10 ENCOUNTER — Other Ambulatory Visit: Payer: Self-pay

## 2022-05-15 ENCOUNTER — Other Ambulatory Visit: Payer: Self-pay

## 2022-05-16 ENCOUNTER — Other Ambulatory Visit: Payer: Self-pay

## 2022-05-22 ENCOUNTER — Other Ambulatory Visit: Payer: Self-pay

## 2022-05-22 NOTE — Progress Notes (Signed)
Specialty Pharmacy Telephone Encounter: Medication Fill Coordination     Charles Galvan  [id number]    Called patient to coordinate medication(s) to be filled at [facility]:    Azathioprine 50 mg    Tacrolimus 1 mg      Telephone follow-up will be scheduled prior to the next refill due date, but patient is encouraged to contact pharmacy sooner if any issues arise.     Ship Date   (MM/DD) Initials &  Program Carrier Method Signature Option Tracking Number   08/02 VR SOT [employer] Overnight Direct Signature Required  [id number]        [employer]    Contact Person:   Patient   Number of Supplies Left in Days: 9    Credit Card on File:   N/A- $0 Copay    [name]  Pharmacy Technician III  Solid Organ Transplant/Specialty Pharmacy  Ph: [phone] Option 9, 1, 4  05/22/22, 11:55 AM

## 2022-05-23 ENCOUNTER — Other Ambulatory Visit: Payer: Self-pay

## 2022-06-12 ENCOUNTER — Other Ambulatory Visit: Payer: Self-pay | Admitting: NEPHROLOGY

## 2022-06-12 ENCOUNTER — Other Ambulatory Visit: Payer: Self-pay

## 2022-06-13 ENCOUNTER — Other Ambulatory Visit: Payer: Self-pay

## 2022-06-13 MED ORDER — AZATHIOPRINE 50 MG TABLET
100.0000 mg | ORAL_TABLET | Freq: Every day | ORAL | 0 refills | Status: DC
Start: 2022-06-13 — End: 2022-07-24
  Filled 2022-06-13 – 2022-06-27 (×2): qty 60, 30d supply, fill #0

## 2022-06-14 ENCOUNTER — Other Ambulatory Visit: Payer: Self-pay

## 2022-06-15 ENCOUNTER — Other Ambulatory Visit: Payer: Self-pay

## 2022-06-18 ENCOUNTER — Other Ambulatory Visit: Payer: Self-pay

## 2022-06-27 ENCOUNTER — Other Ambulatory Visit: Payer: Self-pay

## 2022-06-27 NOTE — Progress Notes (Signed)
Specialty Pharmacy Telephone Encounter: Medication Fill Coordination     Regis Hinton  [id number]    Called patient to coordinate medication(s) to be filled at [facility]:    Azathioprine 50 mg  FK IR 1 mg Sandoz    Telephone follow-up will be scheduled prior to the next refill due date, but patient is encouraged to contact pharmacy sooner if any issues arise.     Ship Date   (MM/DD) Initials &  Program Carrier Method Signature Option Tracking Number   09/07 VR SOT [employer] Overnight Direct Signature Required  [phone]      [employer]    Contact Person: Patient   Number of Supplies Left in Days: 7    Credit Card on File: N/A- $0 Copay    Colbert Coyer  Pharmacy Technician III  Solid Organ Transplant/Specialty Pharmacy  [phone] Opt 9 - 1 - 2    06/27/22  1:59 PM

## 2022-06-28 ENCOUNTER — Other Ambulatory Visit: Payer: Self-pay

## 2022-06-28 NOTE — Progress Notes (Signed)
Vital signs taken, allergies verified, screened for pain and confirmed pharmacy.     Pt brought Logbook for  MD for review.

## 2022-07-03 ENCOUNTER — Ambulatory Visit: Payer: Medicare Other | Admitting: Internal Medicine

## 2022-07-24 ENCOUNTER — Other Ambulatory Visit: Payer: Self-pay | Admitting: Internal Medicine

## 2022-07-24 ENCOUNTER — Other Ambulatory Visit: Payer: Self-pay

## 2022-07-24 MED ORDER — AZATHIOPRINE 50 MG TABLET
100.0000 mg | ORAL_TABLET | Freq: Every day | ORAL | 0 refills | Status: DC
Start: 2022-07-24 — End: 2022-08-17
  Filled 2022-07-24: qty 60, 30d supply, fill #0

## 2022-07-25 ENCOUNTER — Other Ambulatory Visit: Payer: Self-pay

## 2022-07-25 NOTE — Progress Notes (Signed)
Specialty Pharmacy Telephone Encounter: Medication Fill Coordination     Hieu Herms  [id number]    Called patient to coordinate medication(s) to be filled at [place]:    Azathioprine 50 mg    Tacrolimus 1 mg (Sandoz)     Telephone follow-up will be scheduled prior to the next refill due date, but patient is encouraged to contact pharmacy sooner if any issues arise.     Ship Date   (MM/DD) Initials &  Program Carrier Method Signature Option Tracking Number   10/05 JL SOT [employer] Overnight Direct Signature Required  [id number]      [employer]  Contact Person:   Patient   Number of Supplies Left in Days: 7    Credit Card on File:   N/A- $0 Copay    [place] Technician III  Solid Organ Transplant/Specialty Pharmacy  [phone] Opt 9 - 1 - 6  07/25/22  2:16 PM

## 2022-07-26 ENCOUNTER — Other Ambulatory Visit: Payer: Self-pay

## 2022-08-17 ENCOUNTER — Other Ambulatory Visit: Payer: Self-pay

## 2022-08-17 ENCOUNTER — Other Ambulatory Visit: Payer: Self-pay | Admitting: Internal Medicine

## 2022-08-17 MED ORDER — AZATHIOPRINE 50 MG TABLET
100.0000 mg | ORAL_TABLET | Freq: Every day | ORAL | 3 refills | Status: DC
Start: 2022-08-17 — End: 2022-11-23
  Filled 2022-08-17: qty 60, 30d supply, fill #0
  Filled 2022-09-12: qty 60, 30d supply, fill #1
  Filled 2022-10-08: qty 60, 30d supply, fill #2
  Filled 2022-11-01: qty 60, 30d supply, fill #3
  Filled 2022-11-07: qty 31, 16d supply, fill #3
  Filled 2022-11-07: qty 29, 14d supply, fill #3

## 2022-08-20 ENCOUNTER — Other Ambulatory Visit: Payer: Self-pay

## 2022-08-21 ENCOUNTER — Other Ambulatory Visit: Payer: Self-pay

## 2022-08-21 NOTE — Progress Notes (Signed)
Specialty Pharmacy Telephone Encounter: Medication Fill Coordination     Isac Lincks  [id number]    Called patient to coordinate medication(s) to be filled at [place]:    Azathioprine 50mg   Tacrolimus 1mg      Telephone follow-up will be scheduled prior to the next refill due date, but patient is encouraged to contact pharmacy sooner if any issues arise.     Ship Date   (MM/DD) Initials &  Program Carrier Method Signature Option Tracking Number   11/2 JLSOT [employer]Ex Overnight Direct Signature Required  [id number]      [employer]    Contact Person:   Patient   Number of Supplies Left in Days: 9    Credit Card on File:   N/A- $0 Copay    [place] Technician III  Solid Organ Transplant/Specialty Pharmacy  Ph: [phone] Option 9, 1, 4  08/21/22, 10:40 AM

## 2022-08-22 ENCOUNTER — Other Ambulatory Visit: Payer: Self-pay

## 2022-08-23 ENCOUNTER — Other Ambulatory Visit: Payer: Self-pay

## 2022-09-12 ENCOUNTER — Other Ambulatory Visit: Payer: Self-pay

## 2022-09-17 ENCOUNTER — Other Ambulatory Visit: Payer: Self-pay

## 2022-09-17 NOTE — Progress Notes (Signed)
Specialty Pharmacy Telephone Encounter: Medication Fill Coordination     Minard Millirons  [id number]    Called patient to coordinate medication(s) to be filled at [facility]:    Azathioprine 50mg   Tacrolimus 1mg      Telephone follow-up will be scheduled prior to the next refill due date, but patient is encouraged to contact pharmacy sooner if any issues arise.     Ship Date   (MM/DD) Initials &  Program Carrier Method Signature Option Tracking Number   11/28 JL SOT FedEx Overnight No Signature Required   [phone] [id number]          [employer]    Contact Person:   Patient   Number of Supplies Left in Days: 9    Credit Card on File:   N/A- $0 Copay    , CPhT  Specialty Technician III  Specialty Pharmacy  Ph# [phone] Opt 9. Opt 1  09/17/22 4:06 PM

## 2022-09-18 ENCOUNTER — Other Ambulatory Visit: Payer: Self-pay

## 2022-10-08 ENCOUNTER — Other Ambulatory Visit: Payer: Self-pay

## 2022-10-08 NOTE — Progress Notes (Signed)
Specialty Pharmacy Telephone Encounter: Medication Fill Coordination     Charles Galvan  [id number]    Called patient to coordinate medication(s) to be filled at [facility]:    Azathioprine 50 mg    Tacrolimus 1 mg (Sandoz)     Telephone follow-up will be scheduled prior to the next refill due date, but patient is encouraged to contact pharmacy sooner if any issues arise.     Ship Date   (MM/DD) Initials &  Program Carrier Method Signature Option Tracking Number   12/21 JL SOT [employer]Ex Overnight Direct Signature Required  [id number]      [employer]  Contact Person:   Patient   Number of Supplies Left in Days: 7    Credit Card on File:   N/A- $0 Copay    [name]  Pharmacy Technician III  Solid Organ Transplant/Specialty Pharmacy  [phone] Opt 9 - 1 - 6  10/08/22  3:51 PM

## 2022-10-09 ENCOUNTER — Telehealth: Payer: Self-pay

## 2022-10-09 ENCOUNTER — Other Ambulatory Visit: Payer: Self-pay

## 2022-10-09 NOTE — Telephone Encounter (Signed)
The pt is requesting to update his scheduled appt (11/06/2022) to a VV. Contact # [phone]    *Please ask pt to verify the best contact phone #

## 2022-10-09 NOTE — Telephone Encounter (Signed)
Dr.Huang-   Patient was last seen by transplant in Feb 2022; Visit in April 2023 was a [facility]. He did call in July with attempt to schedule but was asked to call back in one month as schedule was full and template unavailable.    CUrrently on schedule for January 2024. Are you okay with that being a video visit per patient request?

## 2022-10-09 NOTE — Telephone Encounter (Signed)
He should come in person this time    Thanks  Minerva Areola

## 2022-10-10 ENCOUNTER — Other Ambulatory Visit: Payer: Self-pay

## 2022-10-11 ENCOUNTER — Other Ambulatory Visit: Payer: Self-pay

## 2022-11-01 ENCOUNTER — Other Ambulatory Visit: Payer: Self-pay

## 2022-11-01 NOTE — Telephone Encounter (Signed)
Patient called again asking for 1-16-2 apt with Charles Galvan to to be a video visit. Let patient know that he had missed apt and PER DR. HUANG he wanted patient to come in person. Patient become angry and upset because he was notified that he has missed apts and needed to come in person. States that Web designer was "unprofessional" because he was notified why it was suppose to be in person and relied the info that was written. Also stated " If we don't want to answer the phone, then don't answer the phone." he is asking to speak with Dr. Ronalee Galvan, to let him know why he can't come in person" I have a lot of reasons why I can't go." [phone]

## 2022-11-01 NOTE — Progress Notes (Signed)
Specialty Pharmacy Telephone Encounter: Medication Fill Coordination     Charles Galvan  [id number]    Called patient to coordinate medication(s) to be filled at [place]:    Azathioprine 50mg   Tac 1mg      Telephone follow-up will be scheduled prior to the next refill due date, but patient is encouraged to contact pharmacy sooner if any issues arise.     Ship Date   (MM/DD) Initials &  Program Carrier Method Signature Option Tracking Number   01/18 JL  SOT [employer] Overnight Direct Signature Required  [id number]     [employer]    Contact Person:   Patient   Number of Supplies Left in Days: 12    Credit Card on File:   N/A- $0 Copay    [place] Technician III  Specialty Pharmacy   Phone [phone] opt 9,1,5  11/01/22, 4:09 PM

## 2022-11-01 NOTE — Telephone Encounter (Signed)
The pt states he is taking care of both his wife and mother who are on chemo. He was advised per Dr Allayne Gitelman instructions it cannot be updated to a VV. He has to reschedule his appt to come in person. Rescheduled to 11/26/2022

## 2022-11-02 ENCOUNTER — Encounter: Payer: Self-pay | Admitting: Internal Medicine

## 2022-11-05 ENCOUNTER — Other Ambulatory Visit: Payer: Self-pay

## 2022-11-06 ENCOUNTER — Other Ambulatory Visit: Payer: Self-pay

## 2022-11-06 ENCOUNTER — Ambulatory Visit: Payer: Medicare Other | Admitting: Internal Medicine

## 2022-11-07 ENCOUNTER — Other Ambulatory Visit: Payer: Self-pay

## 2022-11-08 ENCOUNTER — Other Ambulatory Visit: Payer: Self-pay

## 2022-11-20 ENCOUNTER — Other Ambulatory Visit: Payer: Self-pay | Admitting: Internal Medicine

## 2022-11-21 LAB — URINALYSIS, COMPLETE (EXTERNAL LAB)
Bilirubin_Ext: NEGATIVE
Ketones_Ext: NEGATIVE
Nitrite, Urine_Ext: NEGATIVE
Occult Blood_Ext: NEGATIVE
Protein_Ext: NEGATIVE
Specific Gravity_Ext: 1.021 (ref 1.005–1.030)
Urobilinogen,Semi-Qn_Ext: 0.2 mg/dL (ref 0.2–1.0)
WBC Esterase_Ext: NEGATIVE
pH_Ext: 6.5 (ref 5.0–7.5)

## 2022-11-21 LAB — COMPREHENSIVE METABOLIC PANEL (EXTERNAL LAB)
A/G Ratio_Ext: 1.5 (ref 1.2–2.2)
ALT (SGPT)_Ext: 18 IU/L (ref 0–44)
AST (SGOT)_Ext: 20 IU/L (ref 0–40)
Albumin_Ext: 4.2 g/dL (ref 3.8–4.9)
Alkaline Phosphatase_Ext: 105 IU/L (ref 44–121)
BUN/Creatinine Ratio_Ext: 24 — ABNORMAL HIGH (ref 9–20)
BUN_Ext: 18 mg/dL (ref 6–24)
Bilirubin, Total_Ext: 0.4 mg/dL (ref 0.0–1.2)
Calcium_Ext: 9.2 mg/dL (ref 8.7–10.2)
Carbon Dioxide, Total_Ext: 24 mmol/L (ref 20–29)
Chloride_Ext: 98 mmol/L (ref 96–106)
Creatinine_Ext: 0.74 mg/dL — ABNORMAL LOW (ref 0.76–1.27)
Globulin, Total_Ext: 2.8 g/dL (ref 1.5–4.5)
Glucose_Ext: 404 mg/dL — ABNORMAL HIGH (ref 70–99)
Potassium_Ext: 4.3 mmol/L (ref 3.5–5.2)
Protein, Total_Ext: 7 g/dL (ref 6.0–8.5)
Sodium_Ext: 136 mmol/L (ref 134–144)
eGFR_Ext: 107 mL/min/{1.73_m2} (ref >59)

## 2022-11-21 LAB — CBC (INCLUDES DIFF/PLT) (EXTERNAL LAB)
Basophils % Auto: 1 %
Basophils Abs Auto: 0 10*3/uL (ref 0.0–0.2)
Eosinophils % Auto: 1 %
Eosinophils Abs Auto: 0.1 10*3/uL (ref 0.0–0.4)
Hematocrit: 42.6 % (ref 37.5–51.0)
Hemoglobin: 13.9 g/dL (ref 13.0–17.7)
Immature Grans (Abs)_Ext: 0 10*3/uL (ref 0.0–0.1)
Immature Granulocytes_Ext: 0 %
Lymphocytes % Auto: 15 %
Lymphocytes Abs Auto: 0.8 10*3/uL (ref 0.7–3.1)
MCH: 31.6 pg (ref 26.6–33.0)
MCHC g/dL: 32.6 g/dL (ref 31.5–35.7)
MCV: 97 fL (ref 79–97)
Monocytes % Auto: 7 %
Monocytes Abs Auto: 0.4 10*3/uL (ref 0.1–0.9)
Neutrophils % Auto: 76 %
Neutrophils Abs Auto: 4.2 10*3/uL (ref 1.4–7.0)
Platelet Count: 183 10*3/uL (ref 150–450)
RDW: 13.1 % (ref 11.6–15.4)
Red Blood Cell Count: 4.4 x10E6/uL (ref 4.14–5.80)
White Blood Cell Count: 5.6 10*3/uL (ref 3.4–10.8)

## 2022-11-21 LAB — PROTEIN, TOTAL W/CREAT, RANDOM URINE (EXTERNAL LAB)
Creatinine, Urine_Ext: 40.5 mg/dL
Protein,Total,Urine_Ext: 5.6 mg/dL
Protein/Creat Ratio_Ext: 138 mg/g creat (ref 0–200)

## 2022-11-21 LAB — MICROSCOPIC EXAM URINE (EXTERNAL LAB)
Bacteria_Ext: NONE SEEN
Casts_Ext: NONE SEEN /lpf
Epithelial Cells (non renal)_Ext: NONE SEEN /hpf (ref 0–10)
RBC_Ext: NONE SEEN /hpf (ref 0–2)
WBC_Ext: NONE SEEN /hpf (ref 0–5)

## 2022-11-21 LAB — PHOSPHATE (AS PHOSPHORUS) (EXTERNAL LAB): Phosphorus_Ext: 4.1 mg/dL (ref 2.8–4.1)

## 2022-11-21 LAB — MAGNESIUM (EXTERNAL LAB): Magnesium_Ext: 1.6 mg/dL (ref 1.6–2.3)

## 2022-11-22 LAB — TACROLIMUS BY IMMUNOASSAY (EXTERNAL LAB): Tacrolimus by Immunoassay_Ext: 8.1 ng/mL (ref 2.0–20.0)

## 2022-11-23 ENCOUNTER — Other Ambulatory Visit: Payer: Self-pay

## 2022-11-23 DIAGNOSIS — Z940 Kidney transplant status: Secondary | ICD-10-CM

## 2022-11-23 MED ORDER — TACROLIMUS 1 MG CAPSULE, IMMEDIATE-RELEASE
4.0000 mg | ORAL_CAPSULE | Freq: Two times a day (BID) | ORAL | 3 refills | Status: AC
Start: 2022-11-23 — End: 2023-11-03
  Filled 2022-11-23: qty 240, fill #0
  Filled 2022-12-04: qty 240, 30d supply, fill #0
  Filled 2022-12-31 – 2023-01-24 (×2): qty 240, 30d supply, fill #1
  Filled 2023-02-20 – 2023-04-17 (×3): qty 240, 30d supply, fill #2

## 2022-11-23 MED ORDER — AZATHIOPRINE 50 MG TABLET
100.0000 mg | ORAL_TABLET | Freq: Every day | ORAL | 3 refills | Status: AC
Start: 2022-11-23 — End: 2023-11-18
  Filled 2022-11-23 – 2022-12-04 (×2): qty 60, 30d supply, fill #0
  Filled 2022-12-31 – 2023-01-24 (×2): qty 60, 30d supply, fill #1
  Filled 2023-02-20 – 2023-04-17 (×3): qty 60, 30d supply, fill #2

## 2022-11-26 ENCOUNTER — Other Ambulatory Visit: Payer: Self-pay | Admitting: Internal Medicine

## 2022-11-26 ENCOUNTER — Ambulatory Visit: Payer: Medicare Other | Admitting: Internal Medicine

## 2022-11-26 ENCOUNTER — Telehealth: Payer: Self-pay

## 2022-11-26 ENCOUNTER — Other Ambulatory Visit: Payer: Self-pay

## 2022-11-26 NOTE — Telephone Encounter (Signed)
Pt came into clinic to reschedule his appointment that was scheduled for today at 3:00 pm. Pt expressed his frustration when calling to report that he was running late the last visit that was scheduled by video. This time he understood that he could not be seen because he was past 15 min.     [place] password and labs were provided to patient.

## 2022-12-04 ENCOUNTER — Other Ambulatory Visit: Payer: Self-pay

## 2022-12-04 NOTE — Progress Notes (Signed)
Specialty Pharmacy Telephone Encounter: Medication Fill Coordination     Charles Galvan  [name]    Called patient to coordinate medication(s) to be filled at [place]:    Azathioprine 50 mg    Tacrolimus 1 mg (Sandoz)     Telephone follow-up will be scheduled prior to the next refill due date, but patient is encouraged to contact pharmacy sooner if any issues arise.     Ship Date   (MM/DD) Initials &  Program Carrier Method Signature Option Tracking Number   02/16 JL SOT [employer] Overnight Direct Signature Required  [id number]      [employer]    Contact Person:   Patient   Number of Supplies Left in Days: [place] on File:   N/A- $0 Copay    [place] Technician III  Solid Organ Transplant/Specialty Pharmacy  [phone] Opt 9 - 1 - 6  12/04/22  4:49 PM

## 2022-12-07 ENCOUNTER — Other Ambulatory Visit: Payer: Self-pay

## 2022-12-24 ENCOUNTER — Ambulatory Visit: Payer: Medicare Other | Admitting: Internal Medicine

## 2022-12-31 ENCOUNTER — Other Ambulatory Visit: Payer: Self-pay

## 2022-12-31 ENCOUNTER — Telehealth: Payer: Self-pay

## 2022-12-31 NOTE — Progress Notes (Signed)
Specialty Pharmacy Telephone Encounter: Medication Fill Coordination     Ladaris Besse  [id number]    Called patient to coordinate medication(s) to be filled at [place]:    Azathioprine 50 mg    Tacrolimus 1 mg (Sandoz)     Telephone follow-up will be scheduled prior to the next refill due date, but patient is encouraged to contact pharmacy sooner if any issues arise.     Schedule Ship/Pick Up Date:  03/14  Contact Person:   Patient   Number of Supplies Left in Days: 10    Credit Card on File:   N/A- $0 Copay      Initials &  Program Carrier Method Signature Option   JL SOT [employer] Overnight Direct Signature Required        List of Additional Items:  [place] Technician III  Solid Organ Transplant/Specialty Pharmacy  [phone] Opt 9 - 1 - 6  12/31/22  3:12 PM

## 2022-12-31 NOTE — Telephone Encounter (Addendum)
Patient with Hx of 22% NO SHOWS per Appointment App in EMR.    He has either been a now show or cancellation without re-scheduling multiple times. His last completed visit was in Febuary 2022. He was a NO SHOW on 11/26/22 and cancelled visit for 12/24/22 the week before the visit without re-scheduling.  Prior to that he had six cancel/No Shows between last appointment in July 2020.

## 2023-01-03 ENCOUNTER — Other Ambulatory Visit: Payer: Self-pay

## 2023-01-07 ENCOUNTER — Other Ambulatory Visit: Payer: Self-pay

## 2023-01-08 ENCOUNTER — Other Ambulatory Visit: Payer: Self-pay

## 2023-01-08 NOTE — Telephone Encounter (Signed)
Would encourage him to seek local care.  He also lives far.  Creatinine is excellent, and his main issue seems to be glycemic control.  Until he establishes local care I'm ok with refilling his immunosuppression.    Thanks  Randall Hiss

## 2023-01-15 ENCOUNTER — Other Ambulatory Visit: Payer: Self-pay

## 2023-01-23 ENCOUNTER — Other Ambulatory Visit: Payer: Self-pay

## 2023-01-23 ENCOUNTER — Telehealth: Payer: Self-pay

## 2023-01-23 NOTE — Telephone Encounter (Signed)
Left voicemail to patient to inform them that the medication refill was returned to pharmacy due to multiple delivery attempts.

## 2023-01-24 ENCOUNTER — Other Ambulatory Visit: Payer: Self-pay

## 2023-01-24 NOTE — Progress Notes (Signed)
Specialty Pharmacy Telephone Encounter: Medication Fill Coordination     Loden Alber  [name]    Called patient to coordinate medication(s) to be filled at [place]:    Azathioprine 50 mg  FK IR 1 mg Sandoz    Telephone follow-up will be scheduled prior to the next refill due date, but patient is encouraged to contact pharmacy sooner if any issues arise.     Schedule Ship/Pick Up Date: 04/08  Contact Person: Patient   Number of Supplies Left in Days: 7    Credit Card on File: N/A- $0 Copay    Initials &  Program Carrier Method Signature Option   JL SOT [employer] Overnight No Signature Required        List of Additional Items:  [place] Technician III  Solid Organ Transplant/Specialty Pharmacy  [phone] Opt 9 - 1 - 2    01/24/23  9:20 AM

## 2023-01-25 ENCOUNTER — Other Ambulatory Visit: Payer: Self-pay

## 2023-01-28 ENCOUNTER — Other Ambulatory Visit: Payer: Self-pay

## 2023-02-15 ENCOUNTER — Other Ambulatory Visit: Payer: Self-pay

## 2023-02-19 ENCOUNTER — Other Ambulatory Visit: Payer: Self-pay

## 2023-02-20 ENCOUNTER — Other Ambulatory Visit: Payer: Self-pay

## 2023-02-21 ENCOUNTER — Other Ambulatory Visit: Payer: Self-pay

## 2023-02-25 ENCOUNTER — Other Ambulatory Visit: Payer: Self-pay

## 2023-02-27 ENCOUNTER — Other Ambulatory Visit: Payer: Self-pay

## 2023-03-06 ENCOUNTER — Other Ambulatory Visit: Payer: Self-pay

## 2023-03-07 ENCOUNTER — Other Ambulatory Visit: Payer: Self-pay

## 2023-03-11 ENCOUNTER — Other Ambulatory Visit: Payer: Self-pay

## 2023-03-13 ENCOUNTER — Other Ambulatory Visit: Payer: Self-pay

## 2023-03-27 ENCOUNTER — Other Ambulatory Visit: Payer: Self-pay

## 2023-04-05 ENCOUNTER — Other Ambulatory Visit: Payer: Self-pay | Admitting: Internal Medicine

## 2023-04-06 LAB — CBC (INCLUDES DIFF/PLT) (EXTERNAL LAB)
Basophils % Auto: 1 %
Basophils Abs Auto: 0 10*3/uL (ref 0.0–0.2)
Eosinophils % Auto: 2 %
Eosinophils Abs Auto: 0.1 10*3/uL (ref 0.0–0.4)
Hematocrit: 43.3 % (ref 37.5–51.0)
Hemoglobin: 14.1 g/dL (ref 13.0–17.7)
Immature Grans (Abs)_Ext: 0 10*3/uL (ref 0.0–0.1)
Immature Granulocytes_Ext: 0 %
Lymphocytes % Auto: 22 %
Lymphocytes Abs Auto: 1.1 10*3/uL (ref 0.7–3.1)
MCH: 32 pg (ref 26.6–33.0)
MCHC g/dL: 32.6 g/dL (ref 31.5–35.7)
MCV: 98 fL — ABNORMAL HIGH (ref 79–97)
Monocytes % Auto: 10 %
Monocytes Abs Auto: 0.5 10*3/uL (ref 0.1–0.9)
Neutrophils % Auto: 65 %
Neutrophils Abs Auto: 3.1 10*3/uL (ref 1.4–7.0)
Platelet Count: 192 10*3/uL (ref 150–450)
RDW: 14.1 % (ref 11.6–15.4)
Red Blood Cell Count: 4.41 x10E6/uL (ref 4.14–5.80)
White Blood Cell Count: 4.8 10*3/uL (ref 3.4–10.8)

## 2023-04-06 LAB — COMPREHENSIVE METABOLIC PANEL (EXTERNAL LAB)
A/G Ratio_Ext: 1.7
ALT (SGPT)_Ext: 18 IU/L (ref 0–44)
AST (SGOT)_Ext: 25 IU/L (ref 0–40)
Albumin_Ext: 4.5 g/dL (ref 3.8–4.9)
Alkaline Phosphatase_Ext: 93 IU/L (ref 44–121)
BUN/Creatinine Ratio_Ext: 14 (ref 9–20)
BUN_Ext: 11 mg/dL (ref 6–24)
Bilirubin, Total_Ext: 0.5 mg/dL (ref 0.0–1.2)
Calcium_Ext: 9.7 mg/dL (ref 8.7–10.2)
Carbon Dioxide, Total_Ext: 25 mmol/L (ref 20–29)
Chloride_Ext: 98 mmol/L (ref 96–106)
Creatinine_Ext: 0.77 mg/dL (ref 0.76–1.27)
Globulin, Total_Ext: 2.6 g/dL (ref 1.5–4.5)
Glucose_Ext: 229 mg/dL — ABNORMAL HIGH (ref 70–99)
Potassium_Ext: 4.4 mmol/L (ref 3.5–5.2)
Protein, Total_Ext: 7.1 g/dL (ref 6.0–8.5)
Sodium_Ext: 136 mmol/L (ref 134–144)
eGFR_Ext: 106 mL/min/{1.73_m2} (ref >59)

## 2023-04-06 LAB — URINALYSIS, COMPLETE (EXTERNAL LAB)
Bilirubin_Ext: NEGATIVE
Ketones_Ext: NEGATIVE
Nitrite, Urine_Ext: NEGATIVE
Occult Blood_Ext: NEGATIVE
Specific Gravity_Ext: >=1.03 — AB (ref 1.005–1.030)
Urobilinogen,Semi-Qn_Ext: 0.2 mg/dL (ref 0.2–1.0)
WBC Esterase_Ext: NEGATIVE
pH_Ext: 6.5 (ref 5.0–7.5)

## 2023-04-06 LAB — MICROSCOPIC EXAM URINE (EXTERNAL LAB)
Bacteria_Ext: NONE SEEN
Casts_Ext: NONE SEEN /lpf
Epithelial Cells (non renal)_Ext: NONE SEEN /hpf (ref 0–10)
RBC_Ext: NONE SEEN /hpf (ref 0–2)
WBC_Ext: NONE SEEN /hpf (ref 0–5)

## 2023-04-06 LAB — PHOSPHATE (AS PHOSPHORUS) (EXTERNAL LAB): Phosphorus_Ext: 3 mg/dL (ref 2.8–4.1)

## 2023-04-06 LAB — MAGNESIUM (EXTERNAL LAB): Magnesium_Ext: 1.8 mg/dL (ref 1.6–2.3)

## 2023-04-06 LAB — PROTEIN, TOTAL W/CREAT, RANDOM URINE (EXTERNAL LAB)
Creatinine, Urine_Ext: 107.1 mg/dL
Protein,Total,Urine_Ext: 24.3 mg/dL
Protein/Creat Ratio_Ext: 227 mg/g creat — ABNORMAL HIGH (ref 0–200)

## 2023-04-08 ENCOUNTER — Other Ambulatory Visit: Payer: Self-pay

## 2023-04-09 ENCOUNTER — Telehealth: Payer: Self-pay

## 2023-04-09 ENCOUNTER — Other Ambulatory Visit: Payer: Self-pay

## 2023-04-09 LAB — TACROLIMUS BY IMMUNOASSAY (EXTERNAL LAB): Tacrolimus by Immunoassay_Ext: 8.1 ng/mL (ref 2.0–20.0)

## 2023-04-09 NOTE — Telephone Encounter (Signed)
Patient called in- he moved to [place] and his new physician there sent his rx to [facility] but they are telling him its not being coded correctly or the diagnosis is incorrect and for him to call us to have his nurse help.    His New Dr. Is "Dr. Herma Carson" and her nurse is Bea  [phone]    Patient would like for you to call them directly but I let him know you might be calling him first to get better clarification.    Charles Galvan  Patient Services Representative IIl  Transplant Clinic  [phone]

## 2023-04-09 NOTE — Telephone Encounter (Signed)
Dr.Zarinetchi with [facility] is patients new nephrologist    First visit was yesterday.  Bea tells me she is working on billing issues with patients insurance. I have provided Bea with the number to our [facility] whom had previously renewing patients medications.

## 2023-04-12 ENCOUNTER — Other Ambulatory Visit: Payer: Self-pay

## 2023-04-17 ENCOUNTER — Other Ambulatory Visit: Payer: Self-pay

## 2023-04-19 ENCOUNTER — Other Ambulatory Visit: Payer: Self-pay

## 2023-04-19 NOTE — Progress Notes (Signed)
Patient has been dis-enrolled from the [facility] Transplant Specialty Pharmacy services as of 04/19/23.    Reason(s) for dis-enrollment: PT moved to [place] and was able to transition care there.  Brief description of ongoing needs that could not be met (if applicable): N/A  Comments: PT has been taking Tacrolimus SANDOZ and confirmed w/ [facility] [phone] in [place] that they filled SANDOZ for PT.    Aida Raider  Pharmacy Technician III  Solid Organ Transplant/Specialty Pharmacy  [phone] Opt 9 - 1 - 3    04/19/23  2:14 PM

## 2023-04-22 ENCOUNTER — Other Ambulatory Visit: Payer: Self-pay

## 2023-07-17 IMAGING — MR MRI HIP RT WO CONTRAST
4 of 5 series · 14 of 40 positions shown · non-contrast
Comparison: None.

Images Obtained from [facility]
MRI HIP RT WO CONTRAST
INDICATION: Pain in left hip  - Pain in right hip.
TECHNIQUE: Multiplanar, multiecho imaging of the right hip was performed, including T1-weighted and fluid sensitive sequences without intravenous contrast.

[Series 2: t1_axial_(id)_recon · axial · 4.0mm · 0.33mm/px · z∈[-16,+134]mm · 5 of 35 slices shown]
[im 1/35]
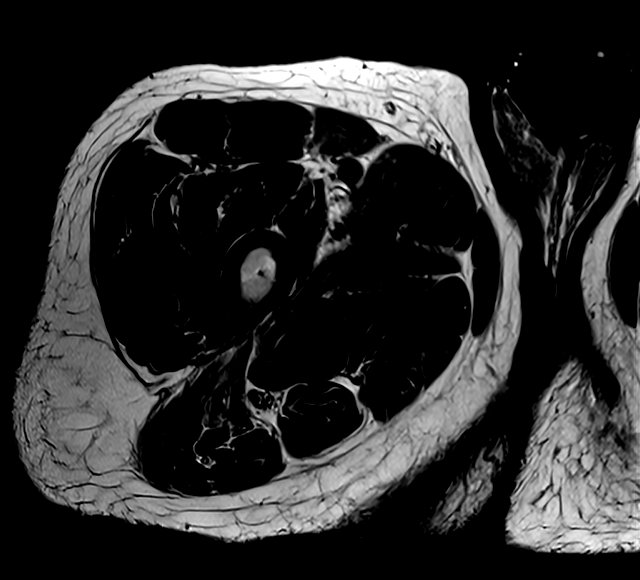
[im 4/35]
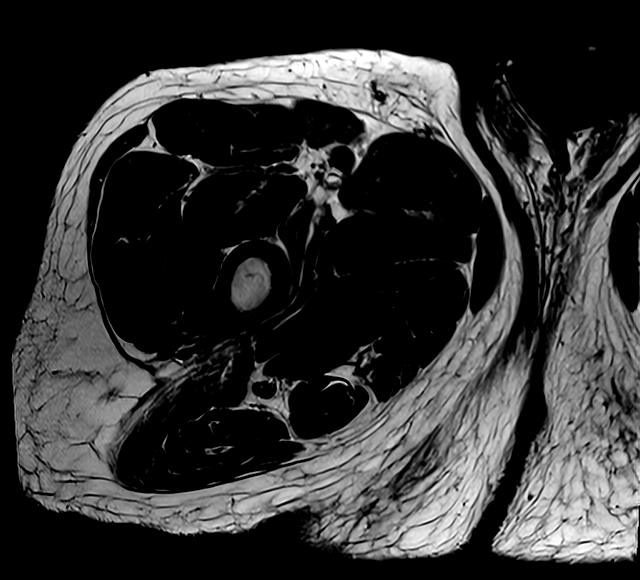
[im 7/35]
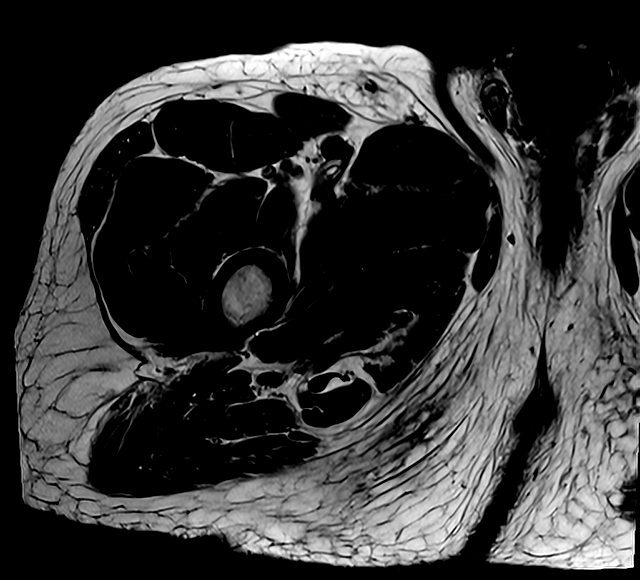
[im 18/35]
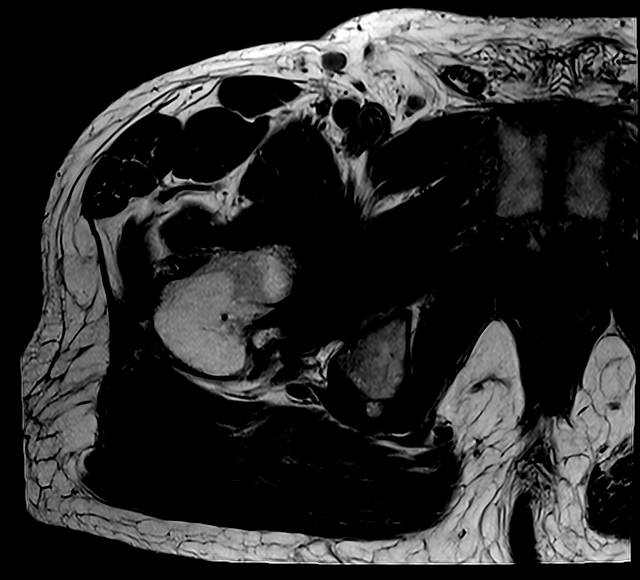
[im 31/35]
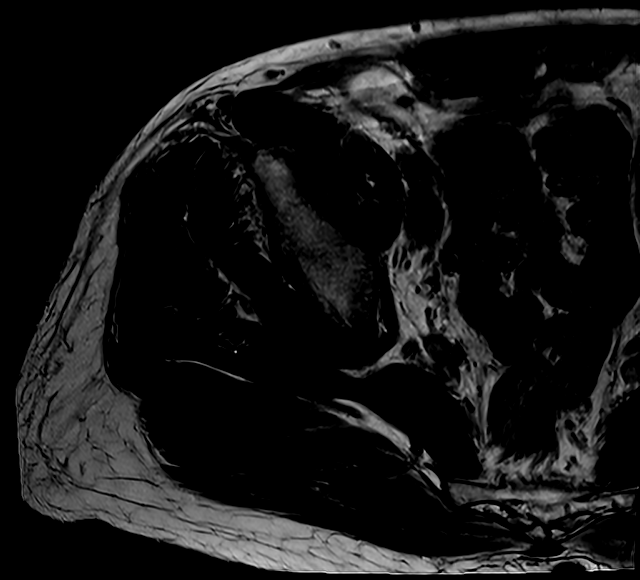

[Series 3: t2_axial_fs_(id)_recon · axial · 4.0mm · 0.33mm/px · z∈[-1,+134]mm · 3 of 35 slices shown]
[im 4/35]
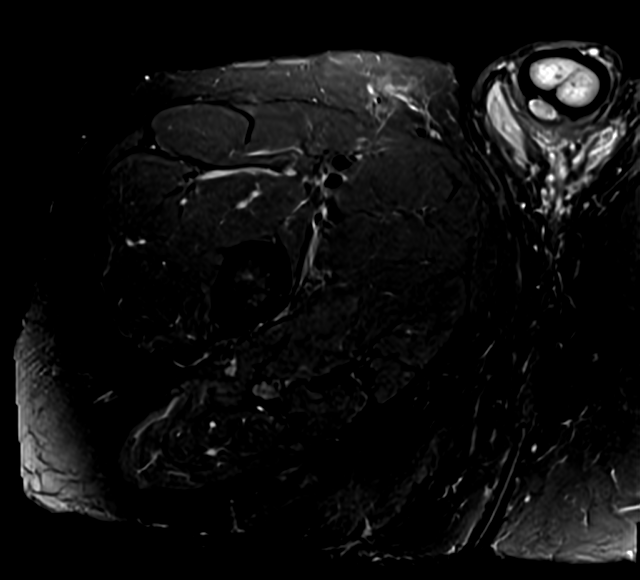
[im 19/35]
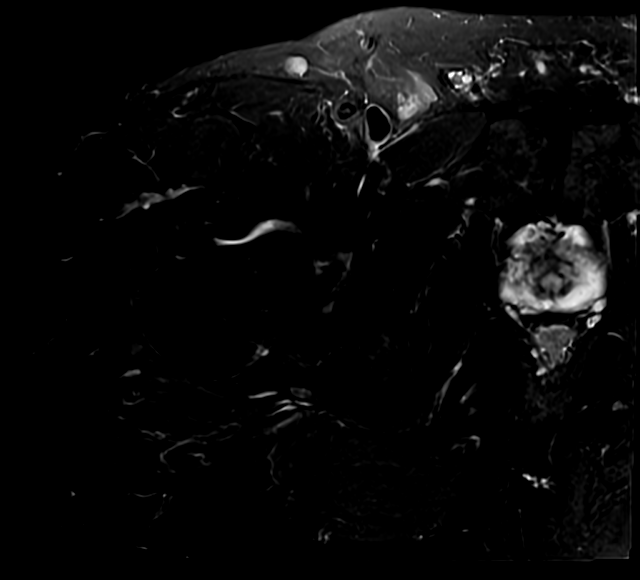
[im 31/35]
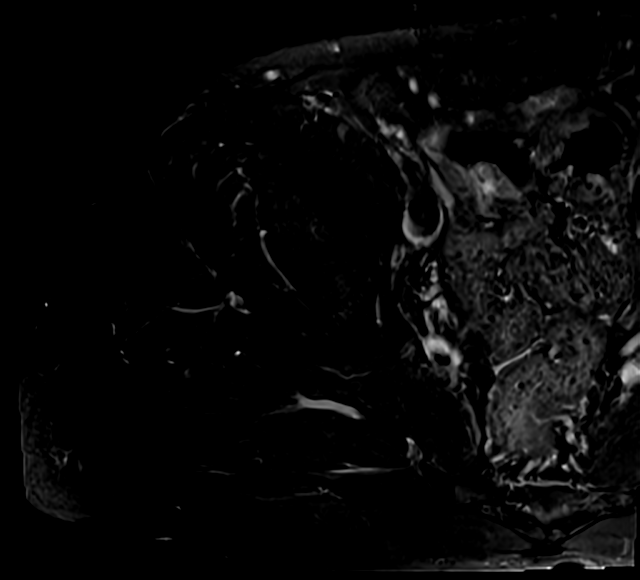

[Series 4: t1_cor_(id)_recon · coronal · 4.0mm · 0.34mm/px · 3 of 26 slices shown]
[im 5/26]
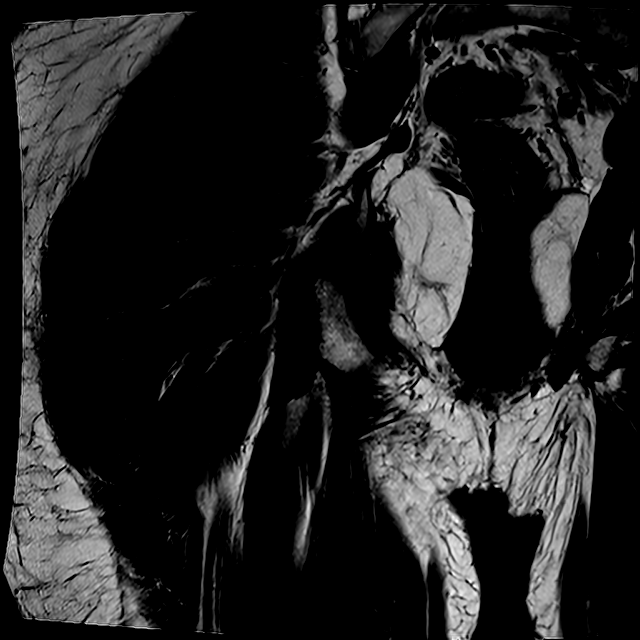
[im 13/26]
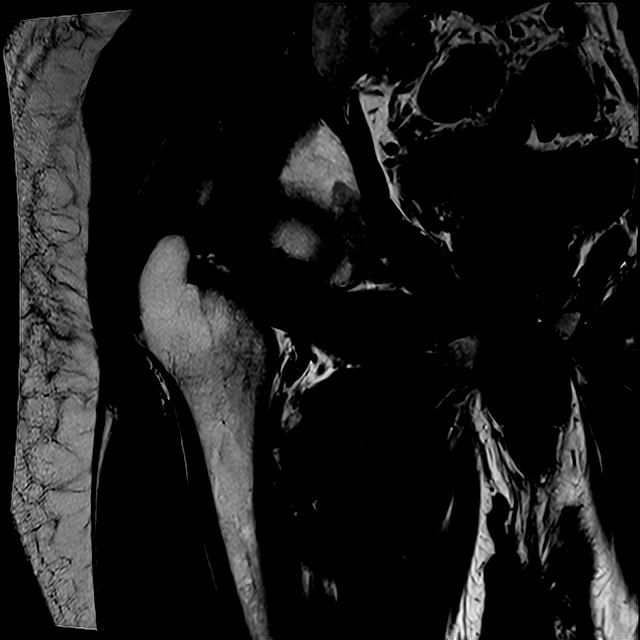
[im 21/26]
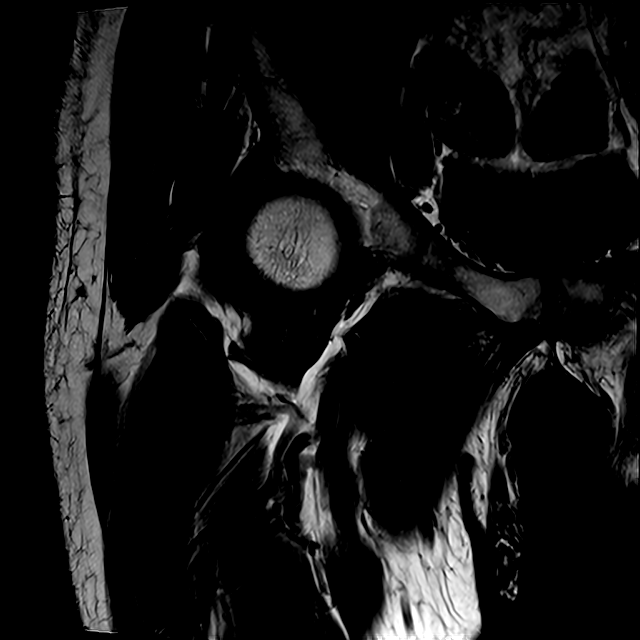

[Series 5: t2_cor_fs_(id)_recon · coronal · 4.0mm · 0.34mm/px · 3 of 26 slices shown]
[im 5/26]
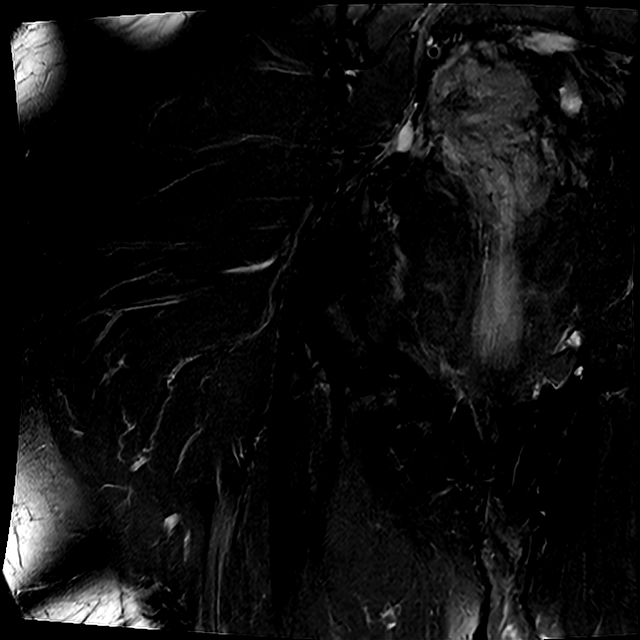
[im 13/26]
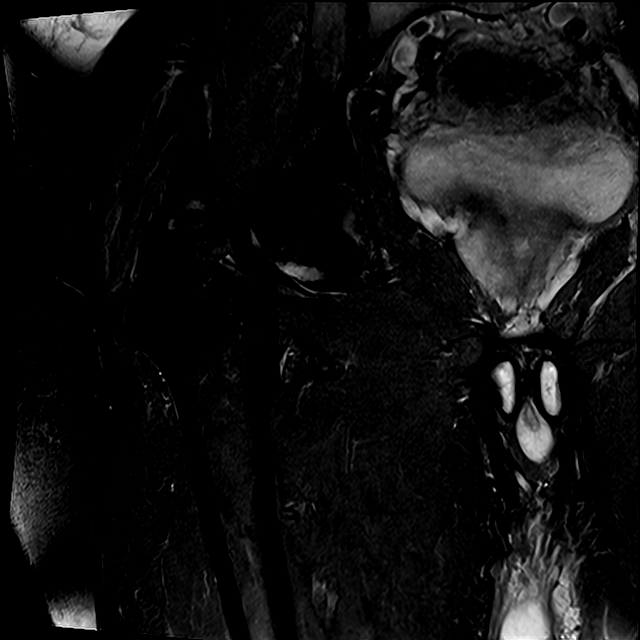
[im 21/26]
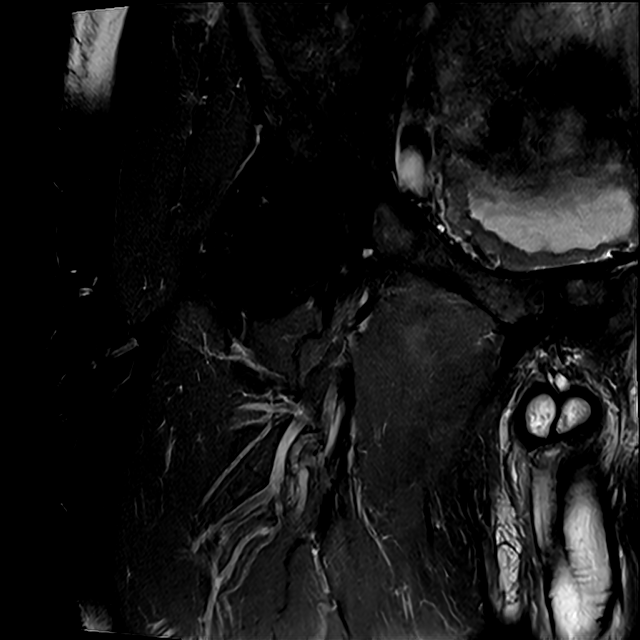

[14 of 40 positions shown; findings below may reference images not displayed]

FINDINGS: Marrow: Normal; no fracture or femoral head AVN.
Visualized SI joints and pubic symphysis: Normal.
Hip joints: Small tear of the anterior labrum. Chondral thinning of the anterior superior aspect of the femoral head and acetabulum. Ligament of teres is intact. No joint effusion.
Tendons: Normal.
Soft tissues: Normal.
Narrowing of the ischial femoral interval. No inguinal adenopathy or hernia on the right.
IMPRESSION: 1.  Small tear of the anterior right hip labrum.
2.  Mild right hip chondral abnormalities.

## 2023-08-29 ENCOUNTER — Other Ambulatory Visit: Payer: Self-pay | Admitting: Internal Medicine

## 2023-08-30 LAB — MAGNESIUM (EXTERNAL LAB): Magnesium_Ext: 1.8 mg/dL (ref 1.6–2.3)

## 2023-08-30 LAB — CBC (INCLUDES DIFF/PLT) (EXTERNAL LAB)
Basophils % Auto: 1 %
Basophils Abs Auto: 0 10*3/uL (ref 0.0–0.2)
Eosinophils % Auto: 3 %
Eosinophils Abs Auto: 0.1 10*3/uL (ref 0.0–0.4)
Hematocrit: 43.6 % (ref 37.5–51.0)
Hemoglobin: 14.2 g/dL (ref 13.0–17.7)
Immature Grans (Abs)_Ext: 0 10*3/uL (ref 0.0–0.1)
Immature Granulocytes_Ext: 1 %
Lymphocytes % Auto: 29 %
Lymphocytes Abs Auto: 1.3 10*3/uL (ref 0.7–3.1)
MCH: 31.7 pg (ref 26.6–33.0)
MCHC g/dL: 32.6 g/dL (ref 31.5–35.7)
MCV: 97 fL (ref 79–97)
Monocytes % Auto: 11 %
Monocytes Abs Auto: 0.5 10*3/uL (ref 0.1–0.9)
Neutrophils % Auto: 55 %
Neutrophils Abs Auto: 2.4 10*3/uL (ref 1.4–7.0)
Platelet Count: 170 10*3/uL (ref 150–450)
RDW: 13.2 % (ref 11.6–15.4)
Red Blood Cell Count: 4.48 x10E6/uL (ref 4.14–5.80)
White Blood Cell Count: 4.3 10*3/uL (ref 3.4–10.8)

## 2023-08-30 LAB — COMPREHENSIVE METABOLIC PANEL (EXTERNAL LAB)
ALT (SGPT)_Ext: 23 [IU]/L (ref 0–44)
AST (SGOT)_Ext: 21 [IU]/L (ref 0–40)
Albumin_Ext: 3.9 g/dL (ref 3.8–4.9)
Alkaline Phosphatase_Ext: 120 [IU]/L (ref 44–121)
BUN/Creatinine Ratio_Ext: 20 (ref 9–20)
BUN_Ext: 15 mg/dL (ref 6–24)
Bilirubin, Total_Ext: 0.2 mg/dL (ref 0.0–1.2)
Calcium_Ext: 9.7 mg/dL (ref 8.7–10.2)
Carbon Dioxide, Total_Ext: 27 mmol/L (ref 20–29)
Chloride_Ext: 102 mmol/L (ref 96–106)
Creatinine_Ext: 0.75 mg/dL — ABNORMAL LOW (ref 0.76–1.27)
Globulin, Total_Ext: 2.9 g/dL (ref 1.5–4.5)
Glucose_Ext: 146 mg/dL — ABNORMAL HIGH (ref 70–99)
Potassium_Ext: 4.4 mmol/L (ref 3.5–5.2)
Protein, Total_Ext: 6.8 g/dL (ref 6.0–8.5)
Sodium_Ext: 142 mmol/L (ref 134–144)
eGFR_Ext: 106 mL/min/{1.73_m2} (ref >59)

## 2023-08-30 LAB — PROTEIN, TOTAL W/CREAT, RANDOM URINE (EXTERNAL LAB)
Creatinine, Urine_Ext: 91.4 mg/dL
Protein,Total,Urine_Ext: 11 mg/dL
Protein/Creat Ratio_Ext: 120 mg/g{creat} (ref 0–200)

## 2023-08-30 LAB — PHOSPHATE (AS PHOSPHORUS) (EXTERNAL LAB): Phosphorus_Ext: 3.5 mg/dL (ref 2.8–4.1)

## 2023-09-03 LAB — TACROLIMUS BY IMMUNOASSAY (EXTERNAL LAB): Tacrolimus by Immunoassay_Ext: 8.2 ng/mL (ref 2.0–20.0)
